# Patient Record
Sex: Male | Born: 1945 | Race: White | Hispanic: No | Marital: Married | Smoking: Never smoker
Health system: Southern US, Community
[De-identification: ages and names within clinical notes are randomized; demographics above are authoritative.]

## PROBLEM LIST (undated history)

## (undated) DIAGNOSIS — E785 Hyperlipidemia, unspecified: Secondary | ICD-10-CM

## (undated) DIAGNOSIS — M1990 Unspecified osteoarthritis, unspecified site: Secondary | ICD-10-CM

## (undated) DIAGNOSIS — G214 Vascular parkinsonism: Secondary | ICD-10-CM

## (undated) DIAGNOSIS — Z951 Presence of aortocoronary bypass graft: Secondary | ICD-10-CM

## (undated) DIAGNOSIS — E119 Type 2 diabetes mellitus without complications: Secondary | ICD-10-CM

## (undated) DIAGNOSIS — K219 Gastro-esophageal reflux disease without esophagitis: Secondary | ICD-10-CM

## (undated) DIAGNOSIS — Z8249 Family history of ischemic heart disease and other diseases of the circulatory system: Secondary | ICD-10-CM

## (undated) DIAGNOSIS — N183 Chronic kidney disease, stage 3 unspecified: Secondary | ICD-10-CM

## (undated) DIAGNOSIS — I10 Essential (primary) hypertension: Secondary | ICD-10-CM

## (undated) DIAGNOSIS — Z9289 Personal history of other medical treatment: Secondary | ICD-10-CM

## (undated) DIAGNOSIS — I4510 Unspecified right bundle-branch block: Secondary | ICD-10-CM

## (undated) DIAGNOSIS — I2510 Atherosclerotic heart disease of native coronary artery without angina pectoris: Secondary | ICD-10-CM

## (undated) DIAGNOSIS — Z8673 Personal history of transient ischemic attack (TIA), and cerebral infarction without residual deficits: Secondary | ICD-10-CM

## (undated) DIAGNOSIS — G479 Sleep disorder, unspecified: Secondary | ICD-10-CM

## (undated) HISTORY — DX: Sleep disorder, unspecified: G47.9

## (undated) HISTORY — DX: Personal history of transient ischemic attack (TIA), and cerebral infarction without residual deficits: Z86.73

## (undated) HISTORY — DX: Vascular parkinsonism: G21.4

## (undated) HISTORY — DX: Family history of ischemic heart disease and other diseases of the circulatory system: Z82.49

## (undated) HISTORY — DX: Hyperlipidemia, unspecified: E78.5

## (undated) HISTORY — PX: TONSILLECTOMY: SUR1361

## (undated) HISTORY — PX: CORONARY ARTERY BYPASS GRAFT: SHX141

## (undated) HISTORY — DX: Unspecified right bundle-branch block: I45.10

## (undated) HISTORY — DX: Atherosclerotic heart disease of native coronary artery without angina pectoris: I25.10

## (undated) HISTORY — DX: Chronic kidney disease, stage 3 unspecified: N18.30

## (undated) HISTORY — DX: Presence of aortocoronary bypass graft: Z95.1

## (undated) HISTORY — DX: Gastro-esophageal reflux disease without esophagitis: K21.9

## (undated) HISTORY — DX: Unspecified osteoarthritis, unspecified site: M19.90

## (undated) HISTORY — DX: Personal history of other medical treatment: Z92.89

## (undated) HISTORY — DX: Essential (primary) hypertension: I10

---

## 1999-05-12 ENCOUNTER — Encounter: Payer: Self-pay | Admitting: Cardiovascular Disease

## 1999-05-12 ENCOUNTER — Ambulatory Visit (HOSPITAL_COMMUNITY): Admission: RE | Admit: 1999-05-12 | Discharge: 1999-05-12 | Payer: Self-pay | Admitting: Cardiovascular Disease

## 1999-05-16 ENCOUNTER — Encounter: Payer: Self-pay | Admitting: Cardiothoracic Surgery

## 1999-05-16 ENCOUNTER — Inpatient Hospital Stay (HOSPITAL_COMMUNITY): Admission: RE | Admit: 1999-05-16 | Discharge: 1999-05-24 | Payer: Self-pay | Admitting: Cardiothoracic Surgery

## 1999-05-17 ENCOUNTER — Encounter: Payer: Self-pay | Admitting: Cardiothoracic Surgery

## 1999-05-18 ENCOUNTER — Encounter: Payer: Self-pay | Admitting: Cardiothoracic Surgery

## 1999-05-19 ENCOUNTER — Encounter: Payer: Self-pay | Admitting: Cardiothoracic Surgery

## 1999-05-20 ENCOUNTER — Encounter: Payer: Self-pay | Admitting: Cardiothoracic Surgery

## 1999-05-21 ENCOUNTER — Encounter: Payer: Self-pay | Admitting: Cardiothoracic Surgery

## 1999-07-22 ENCOUNTER — Encounter: Admission: RE | Admit: 1999-07-22 | Discharge: 1999-07-22 | Payer: Self-pay | Admitting: Cardiothoracic Surgery

## 1999-07-22 ENCOUNTER — Encounter: Payer: Self-pay | Admitting: Cardiothoracic Surgery

## 2000-04-03 HISTORY — PX: CHOLECYSTECTOMY: SHX55

## 2000-11-13 ENCOUNTER — Other Ambulatory Visit: Admission: RE | Admit: 2000-11-13 | Discharge: 2000-11-13 | Payer: Self-pay | Admitting: Gastroenterology

## 2011-04-24 DIAGNOSIS — Z23 Encounter for immunization: Secondary | ICD-10-CM | POA: Diagnosis not present

## 2011-04-24 DIAGNOSIS — E119 Type 2 diabetes mellitus without complications: Secondary | ICD-10-CM | POA: Diagnosis not present

## 2011-04-24 DIAGNOSIS — E782 Mixed hyperlipidemia: Secondary | ICD-10-CM | POA: Diagnosis not present

## 2011-04-24 DIAGNOSIS — Z79899 Other long term (current) drug therapy: Secondary | ICD-10-CM | POA: Diagnosis not present

## 2011-04-24 DIAGNOSIS — M109 Gout, unspecified: Secondary | ICD-10-CM | POA: Diagnosis not present

## 2011-04-26 DIAGNOSIS — I739 Peripheral vascular disease, unspecified: Secondary | ICD-10-CM | POA: Diagnosis not present

## 2011-04-28 DIAGNOSIS — Z1211 Encounter for screening for malignant neoplasm of colon: Secondary | ICD-10-CM | POA: Diagnosis not present

## 2011-07-31 DIAGNOSIS — E782 Mixed hyperlipidemia: Secondary | ICD-10-CM | POA: Diagnosis not present

## 2011-09-05 DIAGNOSIS — L821 Other seborrheic keratosis: Secondary | ICD-10-CM | POA: Diagnosis not present

## 2011-09-05 DIAGNOSIS — D239 Other benign neoplasm of skin, unspecified: Secondary | ICD-10-CM | POA: Diagnosis not present

## 2011-12-12 DIAGNOSIS — H2510 Age-related nuclear cataract, unspecified eye: Secondary | ICD-10-CM | POA: Diagnosis not present

## 2011-12-12 DIAGNOSIS — H25049 Posterior subcapsular polar age-related cataract, unspecified eye: Secondary | ICD-10-CM | POA: Diagnosis not present

## 2011-12-12 DIAGNOSIS — H5210 Myopia, unspecified eye: Secondary | ICD-10-CM | POA: Diagnosis not present

## 2011-12-12 DIAGNOSIS — H02839 Dermatochalasis of unspecified eye, unspecified eyelid: Secondary | ICD-10-CM | POA: Diagnosis not present

## 2012-01-02 DIAGNOSIS — Z9289 Personal history of other medical treatment: Secondary | ICD-10-CM

## 2012-01-02 HISTORY — DX: Personal history of other medical treatment: Z92.89

## 2012-02-01 DIAGNOSIS — I10 Essential (primary) hypertension: Secondary | ICD-10-CM | POA: Diagnosis not present

## 2012-02-01 DIAGNOSIS — E782 Mixed hyperlipidemia: Secondary | ICD-10-CM | POA: Diagnosis not present

## 2012-02-01 DIAGNOSIS — I2510 Atherosclerotic heart disease of native coronary artery without angina pectoris: Secondary | ICD-10-CM | POA: Diagnosis not present

## 2012-02-01 DIAGNOSIS — I739 Peripheral vascular disease, unspecified: Secondary | ICD-10-CM | POA: Diagnosis not present

## 2012-02-12 DIAGNOSIS — E782 Mixed hyperlipidemia: Secondary | ICD-10-CM | POA: Diagnosis not present

## 2012-02-12 DIAGNOSIS — I10 Essential (primary) hypertension: Secondary | ICD-10-CM | POA: Diagnosis not present

## 2012-02-12 DIAGNOSIS — R9431 Abnormal electrocardiogram [ECG] [EKG]: Secondary | ICD-10-CM | POA: Diagnosis not present

## 2012-02-12 DIAGNOSIS — I2510 Atherosclerotic heart disease of native coronary artery without angina pectoris: Secondary | ICD-10-CM | POA: Diagnosis not present

## 2012-02-27 DIAGNOSIS — Z23 Encounter for immunization: Secondary | ICD-10-CM | POA: Diagnosis not present

## 2012-04-24 DIAGNOSIS — E782 Mixed hyperlipidemia: Secondary | ICD-10-CM | POA: Diagnosis not present

## 2012-04-24 DIAGNOSIS — R7309 Other abnormal glucose: Secondary | ICD-10-CM | POA: Diagnosis not present

## 2012-04-24 DIAGNOSIS — Z79899 Other long term (current) drug therapy: Secondary | ICD-10-CM | POA: Diagnosis not present

## 2012-04-24 DIAGNOSIS — I2510 Atherosclerotic heart disease of native coronary artery without angina pectoris: Secondary | ICD-10-CM | POA: Diagnosis not present

## 2012-04-29 DIAGNOSIS — Z1211 Encounter for screening for malignant neoplasm of colon: Secondary | ICD-10-CM | POA: Diagnosis not present

## 2012-05-07 DIAGNOSIS — E119 Type 2 diabetes mellitus without complications: Secondary | ICD-10-CM | POA: Diagnosis not present

## 2012-08-23 DIAGNOSIS — J029 Acute pharyngitis, unspecified: Secondary | ICD-10-CM | POA: Diagnosis not present

## 2012-08-23 DIAGNOSIS — B9789 Other viral agents as the cause of diseases classified elsewhere: Secondary | ICD-10-CM | POA: Diagnosis not present

## 2012-09-20 ENCOUNTER — Other Ambulatory Visit: Payer: Self-pay

## 2012-09-20 DIAGNOSIS — M545 Low back pain: Secondary | ICD-10-CM | POA: Diagnosis not present

## 2012-09-20 MED ORDER — PANTOPRAZOLE SODIUM 40 MG PO TBEC: 40.0000 mg | DELAYED_RELEASE_TABLET | Freq: Every day | ORAL | Status: DC

## 2012-09-20 MED ORDER — METOPROLOL SUCCINATE ER 50 MG PO TB24: 50.0000 mg | ORAL_TABLET | Freq: Every day | ORAL | Status: DC

## 2012-09-20 NOTE — Telephone Encounter (Signed)
Rx was sent to pharmacy electronically. 

## 2012-11-21 ENCOUNTER — Other Ambulatory Visit: Payer: Self-pay | Admitting: *Deleted

## 2012-11-21 MED ORDER — RAMIPRIL 5 MG PO CAPS: 5.0000 mg | ORAL_CAPSULE | Freq: Every day | ORAL | Status: DC

## 2012-11-21 NOTE — Telephone Encounter (Signed)
Rx was sent to pharmacy electronically. 

## 2012-12-11 ENCOUNTER — Other Ambulatory Visit: Payer: Self-pay | Admitting: Dermatology

## 2012-12-11 DIAGNOSIS — D2330 Other benign neoplasm of skin of unspecified part of face: Secondary | ICD-10-CM | POA: Diagnosis not present

## 2012-12-11 DIAGNOSIS — L570 Actinic keratosis: Secondary | ICD-10-CM | POA: Diagnosis not present

## 2012-12-11 DIAGNOSIS — D219 Benign neoplasm of connective and other soft tissue, unspecified: Secondary | ICD-10-CM | POA: Diagnosis not present

## 2012-12-11 DIAGNOSIS — D1801 Hemangioma of skin and subcutaneous tissue: Secondary | ICD-10-CM | POA: Diagnosis not present

## 2012-12-11 DIAGNOSIS — C44319 Basal cell carcinoma of skin of other parts of face: Secondary | ICD-10-CM | POA: Diagnosis not present

## 2012-12-11 DIAGNOSIS — L821 Other seborrheic keratosis: Secondary | ICD-10-CM | POA: Diagnosis not present

## 2012-12-11 DIAGNOSIS — D239 Other benign neoplasm of skin, unspecified: Secondary | ICD-10-CM | POA: Diagnosis not present

## 2013-01-14 DIAGNOSIS — H04129 Dry eye syndrome of unspecified lacrimal gland: Secondary | ICD-10-CM | POA: Diagnosis not present

## 2013-01-14 DIAGNOSIS — R7309 Other abnormal glucose: Secondary | ICD-10-CM | POA: Diagnosis not present

## 2013-01-14 DIAGNOSIS — H2510 Age-related nuclear cataract, unspecified eye: Secondary | ICD-10-CM | POA: Diagnosis not present

## 2013-01-14 DIAGNOSIS — H25049 Posterior subcapsular polar age-related cataract, unspecified eye: Secondary | ICD-10-CM | POA: Diagnosis not present

## 2013-02-01 HISTORY — PX: NOSE SURGERY: SHX723

## 2013-02-05 DIAGNOSIS — C44319 Basal cell carcinoma of skin of other parts of face: Secondary | ICD-10-CM | POA: Diagnosis not present

## 2013-02-05 DIAGNOSIS — Z85828 Personal history of other malignant neoplasm of skin: Secondary | ICD-10-CM | POA: Diagnosis not present

## 2013-02-13 ENCOUNTER — Encounter: Payer: Self-pay | Admitting: Cardiovascular Disease

## 2013-02-13 ENCOUNTER — Ambulatory Visit (INDEPENDENT_AMBULATORY_CARE_PROVIDER_SITE_OTHER): Payer: Medicare Other | Admitting: Cardiovascular Disease

## 2013-02-13 VITALS — BP 150/86 | HR 57 | Ht 72.0 in | Wt 204.7 lb

## 2013-02-13 DIAGNOSIS — Z951 Presence of aortocoronary bypass graft: Secondary | ICD-10-CM | POA: Insufficient documentation

## 2013-02-13 DIAGNOSIS — I2510 Atherosclerotic heart disease of native coronary artery without angina pectoris: Secondary | ICD-10-CM | POA: Diagnosis not present

## 2013-02-13 DIAGNOSIS — I4510 Unspecified right bundle-branch block: Secondary | ICD-10-CM | POA: Insufficient documentation

## 2013-02-13 DIAGNOSIS — I10 Essential (primary) hypertension: Secondary | ICD-10-CM | POA: Diagnosis not present

## 2013-02-13 DIAGNOSIS — E785 Hyperlipidemia, unspecified: Secondary | ICD-10-CM | POA: Diagnosis not present

## 2013-02-13 NOTE — Assessment & Plan Note (Signed)
On statin therapy followed by his PCP 

## 2013-02-13 NOTE — Progress Notes (Signed)
02/13/2013 Joel Gonzales   1945/11/14  [id number]  Primary Physician No PCP Per Patient Primary Cardiologist: Runell Gess MD Roseanne Reno   HPI: The patient is a very pleasant 67 year old mildly overweight Caucasian male, father of 1 child, who I last saw a year ago. He has a history of CAD status post coronary artery bypass surgery x6 in February 2001. He had a LIMA to his LAD, a RIMA to his PDA, right radial to the first obtuse marginal branch, diagonal branch, second marginal branch, and posterolateral branches. His other problems include treated hypertension, dyslipidemia, and chronic right bundle branch block. He denies chest pain or shortness of breath. Dr. Lorin Picket has checked his lipid profile in the past. He had a Myoview stress test performed October 31, which was entirely normal. Since I saw him year ago he's been completely asymptomatic. His primary care physician follows his lipid profile.   Current Outpatient Prescriptions  Medication Sig Dispense Refill  . aspirin 81 MG tablet Take 81 mg by mouth daily.      Marland Kitchen atorvastatin (LIPITOR) 40 MG tablet Take 40 mg by mouth daily.      . metoprolol succinate (TOPROL-XL) 50 MG 24 hr tablet Take 1 tablet (50 mg total) by mouth daily. Take with or immediately following a meal.  30 tablet  5  . pantoprazole (PROTONIX) 40 MG tablet Take 1 tablet (40 mg total) by mouth daily.  30 tablet  5  . ramipril (ALTACE) 5 MG capsule Take 1 capsule (5 mg total) by mouth daily.  30 capsule  3   No current facility-administered medications for this visit.    Not on File  History   Social History  . Marital Status: Married    Spouse Name: N/A    Number of Children: N/A  . Years of Education: N/A   Occupational History  . Not on file.   Social History Main Topics  . Smoking status: Never Smoker   . Smokeless tobacco: Not on file  . Alcohol Use: Yes  . Drug Use: No  . Sexual Activity: Not on file   Other Topics Concern    . Not on file   Social History Narrative  . No narrative on file     Review of Systems: General: negative for chills, fever, night sweats or weight changes.  Cardiovascular: negative for chest pain, dyspnea on exertion, edema, orthopnea, palpitations, paroxysmal nocturnal dyspnea or shortness of breath Dermatological: negative for rash Respiratory: negative for cough or wheezing Urologic: negative for hematuria Abdominal: negative for nausea, vomiting, diarrhea, bright red blood per rectum, melena, or hematemesis Neurologic: negative for visual changes, syncope, or dizziness All other systems reviewed and are otherwise negative except as noted above.    Blood pressure 150/86, pulse 57, height 6' (1.829 m), weight 204 lb 11.2 oz (92.851 kg).  General appearance: alert and no distress Neck: no adenopathy, no carotid bruit, no JVD, supple, symmetrical, trachea midline and thyroid not enlarged, symmetric, no tenderness/mass/nodules Lungs: clear to auscultation bilaterally Heart: regular rate and rhythm, S1, S2 normal, no murmur, click, rub or gallop Extremities: extremities normal, atraumatic, no cyanosis or edema  EKG sinus bradycardia at 57 with right lower branch block  ASSESSMENT AND PLAN:   Coronary artery disease sstatus post coronary artery bypass grafting times 05/10/1999 with a LIMA to his LAD, RIMA to his PDA and a right radial to the first obtuse marginal branch, contains a diagonal branch, circumflex marginal branch and  posterolateral branches. His last Myoview stress test performed 02/01/12 was nonischemic. He is totally asymptomatic.  Hyperlipidemia LDL goal < 130 On statin therapy followed by his PCP  Essential hypertension Well-controlled on current medications  Right bundle branch block Chronic. I have provided him with a copy of his EKG for his records      Runell Gess MD [facility], [facility] 02/13/2013 11:06 AM

## 2013-02-13 NOTE — Patient Instructions (Signed)
Your physician wants you to follow-up in: 1 year with Dr Berry. You will receive a reminder letter in the mail two months in advance. If you don't receive a letter, please call our office to schedule the follow-up appointment.  

## 2013-02-13 NOTE — Assessment & Plan Note (Signed)
Well-controlled on current medications 

## 2013-02-13 NOTE — Assessment & Plan Note (Signed)
sstatus post coronary artery bypass grafting times 05/10/1999 with a LIMA to his LAD, RIMA to his PDA and a right radial to the first obtuse marginal branch, contains a diagonal branch, circumflex marginal branch and posterolateral branches. His last Myoview stress test performed 02/01/12 was nonischemic. He is totally asymptomatic.

## 2013-02-13 NOTE — Assessment & Plan Note (Signed)
Chronic. I have provided him with a copy of his EKG for his records

## 2013-03-07 DIAGNOSIS — Z23 Encounter for immunization: Secondary | ICD-10-CM | POA: Diagnosis not present

## 2013-03-21 ENCOUNTER — Other Ambulatory Visit: Payer: Self-pay | Admitting: *Deleted

## 2013-03-21 MED ORDER — RAMIPRIL 5 MG PO CAPS: 5.0000 mg | ORAL_CAPSULE | Freq: Every day | ORAL | Status: DC

## 2013-03-21 NOTE — Telephone Encounter (Signed)
Rx was sent to pharmacy electronically. 

## 2013-06-11 DIAGNOSIS — L570 Actinic keratosis: Secondary | ICD-10-CM | POA: Diagnosis not present

## 2013-06-11 DIAGNOSIS — Z85828 Personal history of other malignant neoplasm of skin: Secondary | ICD-10-CM | POA: Diagnosis not present

## 2013-06-11 DIAGNOSIS — L821 Other seborrheic keratosis: Secondary | ICD-10-CM | POA: Diagnosis not present

## 2013-06-11 DIAGNOSIS — D239 Other benign neoplasm of skin, unspecified: Secondary | ICD-10-CM | POA: Diagnosis not present

## 2013-12-22 DIAGNOSIS — M949 Disorder of cartilage, unspecified: Secondary | ICD-10-CM | POA: Diagnosis not present

## 2013-12-22 DIAGNOSIS — M542 Cervicalgia: Secondary | ICD-10-CM | POA: Diagnosis not present

## 2013-12-22 DIAGNOSIS — E119 Type 2 diabetes mellitus without complications: Secondary | ICD-10-CM | POA: Diagnosis not present

## 2013-12-22 DIAGNOSIS — M899 Disorder of bone, unspecified: Secondary | ICD-10-CM | POA: Diagnosis not present

## 2013-12-22 DIAGNOSIS — E782 Mixed hyperlipidemia: Secondary | ICD-10-CM | POA: Diagnosis not present

## 2014-01-08 ENCOUNTER — Other Ambulatory Visit: Payer: Self-pay | Admitting: Cardiovascular Disease

## 2014-01-08 DIAGNOSIS — I10 Essential (primary) hypertension: Secondary | ICD-10-CM | POA: Diagnosis not present

## 2014-01-08 DIAGNOSIS — Z23 Encounter for immunization: Secondary | ICD-10-CM | POA: Diagnosis not present

## 2014-01-08 DIAGNOSIS — Z0000 Encounter for general adult medical examination without abnormal findings: Secondary | ICD-10-CM | POA: Diagnosis not present

## 2014-01-08 DIAGNOSIS — E1165 Type 2 diabetes mellitus with hyperglycemia: Secondary | ICD-10-CM | POA: Diagnosis not present

## 2014-01-08 DIAGNOSIS — I2510 Atherosclerotic heart disease of native coronary artery without angina pectoris: Secondary | ICD-10-CM | POA: Diagnosis not present

## 2014-01-08 DIAGNOSIS — E782 Mixed hyperlipidemia: Secondary | ICD-10-CM | POA: Diagnosis not present

## 2014-01-08 NOTE — Telephone Encounter (Signed)
Rx was sent to pharmacy electronically. OV 11/17

## 2014-01-09 DIAGNOSIS — Z1211 Encounter for screening for malignant neoplasm of colon: Secondary | ICD-10-CM | POA: Diagnosis not present

## 2014-02-17 ENCOUNTER — Ambulatory Visit (INDEPENDENT_AMBULATORY_CARE_PROVIDER_SITE_OTHER): Payer: Medicare Other | Admitting: Cardiovascular Disease

## 2014-02-17 ENCOUNTER — Encounter: Payer: Self-pay | Admitting: Cardiovascular Disease

## 2014-02-17 VITALS — BP 128/62 | HR 62 | Ht 71.0 in | Wt 191.5 lb

## 2014-02-17 DIAGNOSIS — I10 Essential (primary) hypertension: Secondary | ICD-10-CM | POA: Diagnosis not present

## 2014-02-17 DIAGNOSIS — I2510 Atherosclerotic heart disease of native coronary artery without angina pectoris: Secondary | ICD-10-CM | POA: Diagnosis not present

## 2014-02-17 DIAGNOSIS — E785 Hyperlipidemia, unspecified: Secondary | ICD-10-CM | POA: Diagnosis not present

## 2014-02-17 DIAGNOSIS — I4510 Unspecified right bundle-branch block: Secondary | ICD-10-CM

## 2014-02-17 NOTE — Assessment & Plan Note (Signed)
Chronic right upper lobe branch block present on today's EKG as well

## 2014-02-17 NOTE — Assessment & Plan Note (Signed)
History of CAD status post coronary artery bypass grafting February 2001 with a LIMA to his LAD, RIMA to the PDA and right radial to obtuse marginal branch, diagonal branch and second marginal branch as well as posterior lateral branches. His last Myoview performed October 2013 was nonischemic. He denies chest pain or shortness of breath.

## 2014-02-17 NOTE — Assessment & Plan Note (Signed)
History of hypertension with blood pressure measured in the office today 128/62. He is on enalapril and metoprolol. Continue current medications at current doses

## 2014-02-17 NOTE — Patient Instructions (Signed)
Your physician recommends that you schedule a follow-up appointment in:ONE year with Dr.Berry

## 2014-02-17 NOTE — Assessment & Plan Note (Signed)
History of hyperlipidemia on atorvastatin 40 mg daily. Recent lipid profile performed by his primary care physician on 12/22/13 revealed a total cholesterol 149, LDL 87 and HDL of 35.

## 2014-02-17 NOTE — Progress Notes (Signed)
02/17/2014 Joel Gonzales   Aug 21, 1945  [id number]  Primary Physician Ann Held, MD Primary Cardiologist: Lorretta Harp MD Joel Gonzales   HPI:  The patient is a very pleasant 68 year old mildly overweight Caucasian male, father of 1 child, who I last saw a year ago. He has a history of CAD status post coronary artery bypass surgery x6 in February 2001. He had a LIMA to his LAD, a RIMA to his PDA, right radial to the first obtuse marginal branch, diagonal branch, second marginal branch, and posterolateral branches. His other problems include treated hypertension, dyslipidemia, and chronic right bundle branch block. He denies chest pain or shortness of breath. Dr. Nicki Reaper has checked his lipid profile in the past. He had a Myoview stress test performed October 31, which was entirely normal. Since I saw him year ago he's been completely asymptomatic. His primary care physician follows his lipid profile which most recently checked 12/22/13 revealed total cholesterol 149, LDL of 87 and HDL of 35.   Current Outpatient Prescriptions  Medication Sig Dispense Refill  . aspirin 81 MG tablet Take 81 mg by mouth daily.    Marland Kitchen atorvastatin (LIPITOR) 40 MG tablet Take 40 mg by mouth daily.    . metoprolol succinate (TOPROL-XL) 50 MG 24 hr tablet Take 1 tablet (50 mg total) by mouth daily. Take with or immediately following a meal. 30 tablet 5  . pantoprazole (PROTONIX) 40 MG tablet Take 1 tablet (40 mg total) by mouth daily. 30 tablet 5  . ramipril (ALTACE) 5 MG capsule TAKE 1 CAPSULE BY MOUTH DAILY. 30 capsule 1  . sitaGLIPtin-metformin (JANUMET) 50-1000 MG per tablet Take 1 tablet by mouth daily before breakfast.     No current facility-administered medications for this visit.    No Known Allergies  History   Social History  . Marital Status: Married    Spouse Name: N/A    Number of Children: N/A  . Years of Education: N/A   Occupational History  . Not on file.   Social  History Main Topics  . Smoking status: Never Smoker   . Smokeless tobacco: Not on file  . Alcohol Use: Yes  . Drug Use: No  . Sexual Activity: Not on file   Other Topics Concern  . Not on file   Social History Narrative     Review of Systems: General: negative for chills, fever, night sweats or weight changes.  Cardiovascular: negative for chest pain, dyspnea on exertion, edema, orthopnea, palpitations, paroxysmal nocturnal dyspnea or shortness of breath Dermatological: negative for rash Respiratory: negative for cough or wheezing Urologic: negative for hematuria Abdominal: negative for nausea, vomiting, diarrhea, bright red blood per rectum, melena, or hematemesis Neurologic: negative for visual changes, syncope, or dizziness All other systems reviewed and are otherwise negative except as noted above.    Blood pressure 128/62, pulse 62, height 5\' 11"  (1.803 m), weight 191 lb 8 oz (86.864 kg).  General appearance: alert and no distress Neck: no adenopathy, no carotid bruit, no JVD, supple, symmetrical, trachea midline and thyroid not enlarged, symmetric, no tenderness/mass/nodules Lungs: clear to auscultation bilaterally Heart: regular rate and rhythm, S1, S2 normal, no murmur, click, rub or gallop Extremities: extremities normal, atraumatic, no cyanosis or edema  EKG Normal sinus rhythm at 62 with right bundle branch block unchanged from prior EKGs. I personally reviewed this EKG  ASSESSMENT AND PLAN:   Coronary artery disease History of CAD status post coronary artery bypass grafting February 2001  with a LIMA to his LAD, RIMA to the PDA and right radial to obtuse marginal branch, diagonal branch and second marginal branch as well as posterior lateral branches. His last Myoview performed October 2013 was nonischemic. He denies chest pain or shortness of breath.  Essential hypertension History of hypertension with blood pressure measured in the office today 128/62. He is on  enalapril and metoprolol. Continue current medications at current doses  Hyperlipidemia with target LDL less than 130 History of hyperlipidemia on atorvastatin 40 mg daily. Recent lipid profile performed by his primary care physician on 12/22/13 revealed a total cholesterol 149, LDL 87 and HDL of 35.  Right bundle branch block Chronic right upper lobe branch block present on today's EKG as well      Lorretta Harp MD [facility], [facility] 02/17/2014 2:16 PM

## 2014-02-23 DIAGNOSIS — H2513 Age-related nuclear cataract, bilateral: Secondary | ICD-10-CM | POA: Diagnosis not present

## 2014-02-23 DIAGNOSIS — E119 Type 2 diabetes mellitus without complications: Secondary | ICD-10-CM | POA: Diagnosis not present

## 2014-02-23 DIAGNOSIS — H5213 Myopia, bilateral: Secondary | ICD-10-CM | POA: Diagnosis not present

## 2014-02-23 DIAGNOSIS — H25041 Posterior subcapsular polar age-related cataract, right eye: Secondary | ICD-10-CM | POA: Diagnosis not present

## 2014-03-13 ENCOUNTER — Other Ambulatory Visit: Payer: Self-pay | Admitting: Cardiovascular Disease

## 2014-03-13 NOTE — Telephone Encounter (Signed)
Rx has been sent to the pharmacy electronically. ° °

## 2014-04-22 ENCOUNTER — Telehealth: Payer: Self-pay | Admitting: Cardiovascular Disease

## 2014-04-22 DIAGNOSIS — I2510 Atherosclerotic heart disease of native coronary artery without angina pectoris: Secondary | ICD-10-CM

## 2014-04-22 NOTE — Telephone Encounter (Signed)
Message routed to Dr. Cecil Cobbs, RN to review and advise on DOT note.

## 2014-04-22 NOTE — Telephone Encounter (Signed)
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

## 2014-04-23 DIAGNOSIS — E1165 Type 2 diabetes mellitus with hyperglycemia: Secondary | ICD-10-CM | POA: Diagnosis not present

## 2014-04-23 NOTE — Telephone Encounter (Signed)
I called and spoke with Brittainy to let her know that Dr Gwenlyn Found would not be able to address this until next week.  She verbalized understanding.

## 2014-04-29 DIAGNOSIS — E1165 Type 2 diabetes mellitus with hyperglycemia: Secondary | ICD-10-CM | POA: Diagnosis not present

## 2014-04-29 DIAGNOSIS — K429 Umbilical hernia without obstruction or gangrene: Secondary | ICD-10-CM | POA: Diagnosis not present

## 2014-05-01 NOTE — Telephone Encounter (Signed)
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

## 2014-05-04 NOTE — Telephone Encounter (Signed)
I spoke with patient and gave him Dr Kennon Holter recommendations for a myoview and echo.  He is agreeable to proceed. Order placed.

## 2014-05-05 ENCOUNTER — Encounter: Payer: Self-pay | Admitting: *Deleted

## 2014-05-05 ENCOUNTER — Telehealth: Payer: Self-pay | Admitting: Cardiovascular Disease

## 2014-05-05 DIAGNOSIS — I10 Essential (primary) hypertension: Secondary | ICD-10-CM | POA: Diagnosis not present

## 2014-05-05 DIAGNOSIS — K429 Umbilical hernia without obstruction or gangrene: Secondary | ICD-10-CM | POA: Diagnosis not present

## 2014-05-05 DIAGNOSIS — E119 Type 2 diabetes mellitus without complications: Secondary | ICD-10-CM | POA: Diagnosis not present

## 2014-05-05 NOTE — Telephone Encounter (Signed)
Request for surgical clearance:  1. What type of surgery is being performed? Umbilical Hernia Repair  2. When is this surgery scheduled? Has not been scheduled yet  3. Are there any medications that need to be held prior to surgery and how long?has not been specified  4. Name of physician performing surgery? Dr. Loetta Rough  5. What is your office phone and fax number? [phone].

## 2014-05-05 NOTE — Telephone Encounter (Signed)
Can this encounter be closed?

## 2014-05-05 NOTE — Telephone Encounter (Signed)
Cleared at low risk for his umbilical hernia repair procedure

## 2014-05-05 NOTE — Telephone Encounter (Signed)
Pt needing surgical clearance and/or medication instructions prior to umbilical hernia repair.  Seen 11/15 for ROV rec 1 yr f/u.  S/p CABG 2001  Myoview stress test Oct 13 normal  Currently on ASA 81mg  daily.  No daily warfarin or NOAC use.

## 2014-05-06 NOTE — Telephone Encounter (Signed)
Can this encounter be closed?

## 2014-05-08 ENCOUNTER — Telehealth: Payer: Self-pay | Admitting: *Deleted

## 2014-05-08 NOTE — Telephone Encounter (Signed)
Letter sent via epic to Dr. Loetta Rough. Last office note sent via epic. Patient scheduled for upcoming 2D echo and Myocardial Perfusion test. Will send results when received.

## 2014-05-08 NOTE — Telephone Encounter (Signed)
Error

## 2014-05-13 ENCOUNTER — Telehealth (HOSPITAL_COMMUNITY): Payer: Self-pay

## 2014-05-13 NOTE — Telephone Encounter (Signed)
Encounter complete. 

## 2014-05-15 ENCOUNTER — Ambulatory Visit (HOSPITAL_COMMUNITY)
Admission: RE | Admit: 2014-05-15 | Discharge: 2014-05-15 | Disposition: A | Discharge status: Home / Self Care | Payer: Medicare Other | Source: Ambulatory Visit | Attending: Cardiovascular Disease | Admitting: Cardiovascular Disease

## 2014-05-15 ENCOUNTER — Ambulatory Visit (HOSPITAL_BASED_OUTPATIENT_CLINIC_OR_DEPARTMENT_OTHER)
Admission: RE | Admit: 2014-05-15 | Discharge: 2014-05-15 | Disposition: A | Discharge status: Home / Self Care | Payer: Medicare Other | Source: Ambulatory Visit | Attending: Cardiovascular Disease | Admitting: Cardiovascular Disease

## 2014-05-15 DIAGNOSIS — Z951 Presence of aortocoronary bypass graft: Secondary | ICD-10-CM | POA: Diagnosis not present

## 2014-05-15 DIAGNOSIS — I2510 Atherosclerotic heart disease of native coronary artery without angina pectoris: Secondary | ICD-10-CM

## 2014-05-15 DIAGNOSIS — Z8249 Family history of ischemic heart disease and other diseases of the circulatory system: Secondary | ICD-10-CM | POA: Diagnosis not present

## 2014-05-15 DIAGNOSIS — Z833 Family history of diabetes mellitus: Secondary | ICD-10-CM | POA: Diagnosis not present

## 2014-05-15 MED ORDER — REGADENOSON 0.4 MG/5ML IV SOLN
0.4000 mg | Freq: Once | INTRAVENOUS | Status: AC
  Administered 2014-05-15: 0.4 mg via INTRAVENOUS

## 2014-05-15 MED ORDER — TECHNETIUM TC 99M SESTAMIBI GENERIC - CARDIOLITE
31.0000 | Freq: Once | INTRAVENOUS | Status: AC | PRN
  Administered 2014-05-15: 31 via INTRAVENOUS

## 2014-05-15 MED ORDER — TECHNETIUM TC 99M SESTAMIBI GENERIC - CARDIOLITE
10.6000 | Freq: Once | INTRAVENOUS | Status: AC | PRN
  Administered 2014-05-15: 11 via INTRAVENOUS

## 2014-05-15 MED ORDER — AMINOPHYLLINE 25 MG/ML IV SOLN
125.0000 mg | Freq: Once | INTRAVENOUS | Status: AC
  Administered 2014-05-15: 125 mg via INTRAVENOUS

## 2014-05-15 NOTE — Procedures (Addendum)
[place] [address] [place] [address] [phone]  Cardiology Nuclear Med Study  Joel Gonzales. is a 69 y.o. male     MRN : [id number]     DOB: 05-Oct-1945  Procedure Date: 05/15/2014  Nuclear Med Background Indication for Stress Test:  Follow up CAD History:  CAD;RBBB;CABG x6-05/1999;Last NUC MPI on 02/01/2012-normal(not in epic);diabetes Cardiac Risk Factors: Family History - CAD, Hypertension, Lipids, NIDDM, Overweight and RBBB  Symptoms:  Pt denies symptoms at this time.   Nuclear Pre-Procedure Caffeine/Decaff Intake:  7:00pm NPO After: 5:00am   IV Site: R Forearm  IV 0.9% NS with Angio Cath:  22g  Chest Size (in):  46" IV Started by: Rolene Course, RN  Height: 5\' 11"  (1.803 m)  Cup Size: n/a  BMI:  Body mass index is 26.65 kg/(m^2). Weight:  191 lb (86.637 kg)   Tech Comments:  n/a    Nuclear Med Study 1 or 2 day study: 1 day  Stress Test Type:  Trail  Order Authorizing Provider:  Quay Burow, MD   Resting Radionuclide: Technetium 72m Sestamibi  Resting Radionuclide Dose: 10.6 mCi   Stress Radionuclide:  Technetium 3m Sestamibi  Stress Radionuclide Dose: 31.0 mCi           Stress Protocol Rest HR: 55 Stress HR: 76  Rest BP: 140/81 Stress BP:155/98  Exercise Time (min): n/a METS: n/a          Dose of Adenosine (mg):  n/a Dose of Lexiscan: 0.4 mg  Dose of Atropine (mg): n/a Dose of Dobutamine: n/a mcg/kg/min (at max HR)  Stress Test Technologist: Mellody Memos, CCT Nuclear Technologist: Imagene Riches, CNMT   Rest Procedure:  Myocardial perfusion imaging was performed at rest 45 minutes following the intravenous administration of Technetium 31m Sestamibi. Stress Procedure:  The patient received IV Lexiscan 0.4 mg over 15-seconds.  Technetium 42m Sestamibi injected IV at 30-seconds. Patient experienced light-headedness, a drop in blood pressure and heart rate. He was administered 125 mg of  Aminophylline IV at 5 minutes.  There were no significant changes with Lexiscan.  Quantitative spect images were obtained after a 45 minute delay.  Transient Ischemic Dilatation (Normal <1.22):  1.26  QGS EDV:  116 ml QGS ESV:  54 ml LV Ejection Fraction: 53%        Rest ECG: NSR-RBBB  Stress ECG: No significant change from baseline ECG  QPS Raw Data Images:  Normal; no motion artifact; normal heart/lung ratio. Stress Images:  Normal homogeneous uptake in all areas of the myocardium. Rest Images:  Normal homogeneous uptake in all areas of the myocardium. Subtraction (SDS):  No evidence of ischemia.  Impression Exercise Capacity:  Lexiscan with no exercise. BP Response:  Hypotensive blood pressure response. Clinical Symptoms:  No significant symptoms noted. ECG Impression:  No significant ST segment change suggestive of ischemia. Comparison with Prior Nuclear Study: No significant change from previous study  Overall Impression:  Normal stress nuclear study.  LV Wall Motion:  NL LV Function; NL Wall Motion   Lorretta Harp, MD  05/15/2014 1:48 PM

## 2014-05-15 NOTE — Progress Notes (Signed)
2D Echocardiogram Complete.  05/15/2014   Senita Corredor, RDCS  

## 2014-05-19 NOTE — Telephone Encounter (Signed)
Have these results been received yet?

## 2014-05-27 NOTE — Telephone Encounter (Signed)
Has this pt received his results yet?

## 2014-06-17 DIAGNOSIS — D1801 Hemangioma of skin and subcutaneous tissue: Secondary | ICD-10-CM | POA: Diagnosis not present

## 2014-06-17 DIAGNOSIS — L821 Other seborrheic keratosis: Secondary | ICD-10-CM | POA: Diagnosis not present

## 2014-06-17 DIAGNOSIS — Z85828 Personal history of other malignant neoplasm of skin: Secondary | ICD-10-CM | POA: Diagnosis not present

## 2014-06-17 DIAGNOSIS — L570 Actinic keratosis: Secondary | ICD-10-CM | POA: Diagnosis not present

## 2014-06-17 DIAGNOSIS — D2262 Melanocytic nevi of left upper limb, including shoulder: Secondary | ICD-10-CM | POA: Diagnosis not present

## 2014-06-17 DIAGNOSIS — D225 Melanocytic nevi of trunk: Secondary | ICD-10-CM | POA: Diagnosis not present

## 2014-07-28 ENCOUNTER — Encounter: Payer: Self-pay | Admitting: *Deleted

## 2015-01-18 ENCOUNTER — Other Ambulatory Visit: Payer: Self-pay | Admitting: Cardiovascular Disease

## 2015-01-18 NOTE — Telephone Encounter (Signed)
Rx request sent to pharmacy.  

## 2015-02-16 ENCOUNTER — Other Ambulatory Visit: Payer: Self-pay | Admitting: Cardiovascular Disease

## 2015-02-23 ENCOUNTER — Ambulatory Visit (INDEPENDENT_AMBULATORY_CARE_PROVIDER_SITE_OTHER): Payer: Medicare Other | Admitting: Cardiovascular Disease

## 2015-02-23 ENCOUNTER — Encounter: Payer: Self-pay | Admitting: Cardiovascular Disease

## 2015-02-23 DIAGNOSIS — I4510 Unspecified right bundle-branch block: Secondary | ICD-10-CM

## 2015-02-23 DIAGNOSIS — I10 Essential (primary) hypertension: Secondary | ICD-10-CM | POA: Diagnosis not present

## 2015-02-23 DIAGNOSIS — I2510 Atherosclerotic heart disease of native coronary artery without angina pectoris: Secondary | ICD-10-CM

## 2015-02-23 DIAGNOSIS — E785 Hyperlipidemia, unspecified: Secondary | ICD-10-CM

## 2015-02-23 MED ORDER — METOPROLOL SUCCINATE ER 50 MG PO TB24: 50.0000 mg | ORAL_TABLET | Freq: Every day | ORAL | Status: DC

## 2015-02-23 MED ORDER — ATORVASTATIN CALCIUM 40 MG PO TABS: 40.0000 mg | ORAL_TABLET | Freq: Every day | ORAL | Status: DC

## 2015-02-23 MED ORDER — PANTOPRAZOLE SODIUM 40 MG PO TBEC
40.0000 mg | DELAYED_RELEASE_TABLET | Freq: Every day | ORAL | Status: DC
Start: 2015-02-23 — End: 2022-03-14

## 2015-02-23 MED ORDER — RAMIPRIL 5 MG PO CAPS: 5.0000 mg | ORAL_CAPSULE | Freq: Every day | ORAL | Status: DC

## 2015-02-23 NOTE — Assessment & Plan Note (Signed)
History of hyperlipidemia atorvastatin 40 followed by his PCP

## 2015-02-23 NOTE — Assessment & Plan Note (Signed)
chronic

## 2015-02-23 NOTE — Assessment & Plan Note (Signed)
History of hypertension with blood pressure measured today 140/80. He is on metoprolol and ramipril. Continue current meds at current dosing

## 2015-02-23 NOTE — Progress Notes (Signed)
02/23/2015 Joel Gonzales.   17-Oct-1945  [id number]  Primary Physician Ann Held, MD Primary Cardiologist: Joel Harp MD Renae Gloss   HPI:  The patient is a very pleasant 69 year old mildly overweight Caucasian male, father of 1 child, who I last saw a year ago. He has a history of CAD status post coronary artery bypass surgery x6 in February 2001. He had a LIMA to his LAD, a RIMA to his PDA, right radial to the first obtuse marginal branch, diagonal branch, second marginal branch, and posterolateral branches. His other problems include treated hypertension, dyslipidemia, and chronic right bundle branch block. He denies chest pain or shortness of breath. Dr. Nicki Reaper has checked his lipid profile in the past. He had a Myoview stress test performed October 31, which was entirely normal. Since I saw him year ago he's been completely asymptomatic. He had a Myoview stress test performed in the office 05/15/14 which is entirely normal as was a 2-D echocardiogram.  Current Outpatient Prescriptions  Medication Sig Dispense Refill  . aspirin 81 MG tablet Take 81 mg by mouth daily.    Marland Kitchen atorvastatin (LIPITOR) 40 MG tablet Take 40 mg by mouth daily.    . metoprolol succinate (TOPROL-XL) 50 MG 24 hr tablet Take 1 tablet (50 mg total) by mouth daily. Take with or immediately following a meal. 30 tablet 5  . pantoprazole (PROTONIX) 40 MG tablet Take 1 tablet (40 mg total) by mouth daily. 30 tablet 5  . ramipril (ALTACE) 5 MG capsule TAKE 1 CAPSULE DAILY 30 capsule 0  . sitaGLIPtin-metformin (JANUMET) 50-1000 MG per tablet Take 1 tablet by mouth daily before breakfast.     No current facility-administered medications for this visit.    No Known Allergies  Social History   Social History  . Marital Status: Married    Spouse Name: N/A  . Number of Children: N/A  . Years of Education: N/A   Occupational History  . Not on file.   Social History Main Topics  . Smoking  status: Never Smoker   . Smokeless tobacco: Never Used  . Alcohol Use: Yes  . Drug Use: No  . Sexual Activity: Not on file   Other Topics Concern  . Not on file   Social History Narrative     Review of Systems: General: negative for chills, fever, night sweats or weight changes.  Cardiovascular: negative for chest pain, dyspnea on exertion, edema, orthopnea, palpitations, paroxysmal nocturnal dyspnea or shortness of breath Dermatological: negative for rash Respiratory: negative for cough or wheezing Urologic: negative for hematuria Abdominal: negative for nausea, vomiting, diarrhea, bright red blood per rectum, melena, or hematemesis Neurologic: negative for visual changes, syncope, or dizziness All other systems reviewed and are otherwise negative except as noted above.    Blood pressure 148/80, pulse 62, height 5\' 11"  (1.803 m), weight 201 lb (91.173 kg).  General appearance: alert and no distress Neck: no adenopathy, no carotid bruit, no JVD, supple, symmetrical, trachea midline and thyroid not enlarged, symmetric, no tenderness/mass/nodules Lungs: clear to auscultation bilaterally Heart: regular rate and rhythm, S1, S2 normal, no murmur, click, rub or gallop Extremities: extremities normal, atraumatic, no cyanosis or edema  EKG sinus rhythm at 62 with right bundle branch block. I personally reviewed this EKG  ASSESSMENT AND PLAN:   Right bundle branch block chronic  Hyperlipidemia with target LDL less than 130 History of hyperlipidemia atorvastatin 40 followed by his PCP  Essential hypertension History of hypertension  with blood pressure measured today 140/80. He is on metoprolol and ramipril. Continue current meds at current dosing  Coronary artery disease History of coronary artery disease status post bypass grafting 2/01 with a LIMA to his LAD, RIMA to the PDA, right radial to the first obtuse marginal branch, diagonal branch, second marginal branch and posterior  lateral branches. He had a Myoview stress test performed in February of this year which was nonischemic and 2-D echo which was normal as well. He denies chest pain or shortness of breath      Joel Harp MD [facility], [facility] 02/23/2015 2:57 PM

## 2015-02-23 NOTE — Assessment & Plan Note (Signed)
History of coronary artery disease status post bypass grafting 2/01 with a LIMA to his LAD, RIMA to the PDA, right radial to the first obtuse marginal branch, diagonal branch, second marginal branch and posterior lateral branches. He had a Myoview stress test performed in February of this year which was nonischemic and 2-D echo which was normal as well. He denies chest pain or shortness of breath

## 2015-02-23 NOTE — Patient Instructions (Signed)

## 2015-03-05 DIAGNOSIS — L820 Inflamed seborrheic keratosis: Secondary | ICD-10-CM | POA: Diagnosis not present

## 2015-03-05 DIAGNOSIS — Z85828 Personal history of other malignant neoplasm of skin: Secondary | ICD-10-CM | POA: Diagnosis not present

## 2015-03-09 DIAGNOSIS — Z23 Encounter for immunization: Secondary | ICD-10-CM | POA: Diagnosis not present

## 2015-04-12 ENCOUNTER — Other Ambulatory Visit: Payer: Self-pay | Admitting: Cardiovascular Disease

## 2015-04-12 MED ORDER — METOPROLOL SUCCINATE ER 50 MG PO TB24: 50.0000 mg | ORAL_TABLET | Freq: Every day | ORAL | Status: DC

## 2015-04-20 ENCOUNTER — Encounter: Payer: Self-pay | Admitting: Gastroenterology

## 2015-04-23 DIAGNOSIS — J111 Influenza due to unidentified influenza virus with other respiratory manifestations: Secondary | ICD-10-CM | POA: Diagnosis not present

## 2015-06-23 DIAGNOSIS — D1801 Hemangioma of skin and subcutaneous tissue: Secondary | ICD-10-CM | POA: Diagnosis not present

## 2015-06-23 DIAGNOSIS — L814 Other melanin hyperpigmentation: Secondary | ICD-10-CM | POA: Diagnosis not present

## 2015-06-23 DIAGNOSIS — Z85828 Personal history of other malignant neoplasm of skin: Secondary | ICD-10-CM | POA: Diagnosis not present

## 2015-06-23 DIAGNOSIS — L570 Actinic keratosis: Secondary | ICD-10-CM | POA: Diagnosis not present

## 2015-07-05 DIAGNOSIS — E1165 Type 2 diabetes mellitus with hyperglycemia: Secondary | ICD-10-CM | POA: Diagnosis not present

## 2015-07-05 DIAGNOSIS — E782 Mixed hyperlipidemia: Secondary | ICD-10-CM | POA: Diagnosis not present

## 2015-07-05 DIAGNOSIS — Z0000 Encounter for general adult medical examination without abnormal findings: Secondary | ICD-10-CM | POA: Diagnosis not present

## 2015-07-05 DIAGNOSIS — I10 Essential (primary) hypertension: Secondary | ICD-10-CM | POA: Diagnosis not present

## 2015-07-05 DIAGNOSIS — E785 Hyperlipidemia, unspecified: Secondary | ICD-10-CM | POA: Diagnosis not present

## 2015-07-05 DIAGNOSIS — Z79899 Other long term (current) drug therapy: Secondary | ICD-10-CM | POA: Diagnosis not present

## 2015-07-05 DIAGNOSIS — I2510 Atherosclerotic heart disease of native coronary artery without angina pectoris: Secondary | ICD-10-CM | POA: Diagnosis not present

## 2015-07-21 DIAGNOSIS — Z1211 Encounter for screening for malignant neoplasm of colon: Secondary | ICD-10-CM | POA: Diagnosis not present

## 2015-08-17 DIAGNOSIS — Z79899 Other long term (current) drug therapy: Secondary | ICD-10-CM | POA: Diagnosis not present

## 2015-08-19 DIAGNOSIS — E1165 Type 2 diabetes mellitus with hyperglycemia: Secondary | ICD-10-CM | POA: Diagnosis not present

## 2015-08-19 DIAGNOSIS — I10 Essential (primary) hypertension: Secondary | ICD-10-CM | POA: Diagnosis not present

## 2015-12-13 DIAGNOSIS — Z85828 Personal history of other malignant neoplasm of skin: Secondary | ICD-10-CM | POA: Diagnosis not present

## 2015-12-13 DIAGNOSIS — D485 Neoplasm of uncertain behavior of skin: Secondary | ICD-10-CM | POA: Diagnosis not present

## 2015-12-13 DIAGNOSIS — L578 Other skin changes due to chronic exposure to nonionizing radiation: Secondary | ICD-10-CM | POA: Diagnosis not present

## 2015-12-13 DIAGNOSIS — L110 Acquired keratosis follicularis: Secondary | ICD-10-CM | POA: Diagnosis not present

## 2016-01-10 ENCOUNTER — Other Ambulatory Visit: Payer: Self-pay | Admitting: Cardiovascular Disease

## 2016-01-11 ENCOUNTER — Other Ambulatory Visit: Payer: Self-pay | Admitting: Cardiovascular Disease

## 2016-02-02 DIAGNOSIS — Z23 Encounter for immunization: Secondary | ICD-10-CM | POA: Diagnosis not present

## 2016-02-02 DIAGNOSIS — T1502XA Foreign body in cornea, left eye, initial encounter: Secondary | ICD-10-CM | POA: Diagnosis not present

## 2016-02-10 ENCOUNTER — Other Ambulatory Visit: Payer: Self-pay | Admitting: Cardiovascular Disease

## 2016-04-28 DIAGNOSIS — H2513 Age-related nuclear cataract, bilateral: Secondary | ICD-10-CM | POA: Diagnosis not present

## 2016-04-28 DIAGNOSIS — E113291 Type 2 diabetes mellitus with mild nonproliferative diabetic retinopathy without macular edema, right eye: Secondary | ICD-10-CM | POA: Diagnosis not present

## 2016-04-28 DIAGNOSIS — H524 Presbyopia: Secondary | ICD-10-CM | POA: Diagnosis not present

## 2016-04-28 DIAGNOSIS — H25041 Posterior subcapsular polar age-related cataract, right eye: Secondary | ICD-10-CM | POA: Diagnosis not present

## 2016-08-09 DIAGNOSIS — E1165 Type 2 diabetes mellitus with hyperglycemia: Secondary | ICD-10-CM | POA: Diagnosis not present

## 2016-08-09 DIAGNOSIS — Z1389 Encounter for screening for other disorder: Secondary | ICD-10-CM | POA: Diagnosis not present

## 2016-08-09 DIAGNOSIS — Z0000 Encounter for general adult medical examination without abnormal findings: Secondary | ICD-10-CM | POA: Diagnosis not present

## 2016-08-09 DIAGNOSIS — Z6827 Body mass index (BMI) 27.0-27.9, adult: Secondary | ICD-10-CM | POA: Diagnosis not present

## 2016-11-01 ENCOUNTER — Other Ambulatory Visit: Payer: Self-pay | Admitting: Cardiovascular Disease

## 2017-01-10 ENCOUNTER — Other Ambulatory Visit: Payer: Self-pay | Admitting: Cardiovascular Disease

## 2017-02-09 DIAGNOSIS — Z23 Encounter for immunization: Secondary | ICD-10-CM | POA: Diagnosis not present

## 2017-02-21 DIAGNOSIS — Z1339 Encounter for screening examination for other mental health and behavioral disorders: Secondary | ICD-10-CM | POA: Diagnosis not present

## 2017-02-21 DIAGNOSIS — E1165 Type 2 diabetes mellitus with hyperglycemia: Secondary | ICD-10-CM | POA: Diagnosis not present

## 2017-02-21 DIAGNOSIS — Z79899 Other long term (current) drug therapy: Secondary | ICD-10-CM | POA: Diagnosis not present

## 2017-02-21 DIAGNOSIS — E782 Mixed hyperlipidemia: Secondary | ICD-10-CM | POA: Diagnosis not present

## 2017-02-21 DIAGNOSIS — Z1331 Encounter for screening for depression: Secondary | ICD-10-CM | POA: Diagnosis not present

## 2017-02-21 DIAGNOSIS — R2689 Other abnormalities of gait and mobility: Secondary | ICD-10-CM | POA: Diagnosis not present

## 2017-02-21 DIAGNOSIS — Z125 Encounter for screening for malignant neoplasm of prostate: Secondary | ICD-10-CM | POA: Diagnosis not present

## 2017-02-28 DIAGNOSIS — R51 Headache: Secondary | ICD-10-CM | POA: Diagnosis not present

## 2017-02-28 DIAGNOSIS — R2689 Other abnormalities of gait and mobility: Secondary | ICD-10-CM | POA: Diagnosis not present

## 2017-03-04 ENCOUNTER — Emergency Department (HOSPITAL_COMMUNITY): Payer: Medicare Other

## 2017-03-04 ENCOUNTER — Other Ambulatory Visit: Payer: Self-pay

## 2017-03-04 ENCOUNTER — Encounter (HOSPITAL_COMMUNITY): Payer: Self-pay

## 2017-03-04 ENCOUNTER — Inpatient Hospital Stay (HOSPITAL_COMMUNITY)
Admission: EM | Admit: 2017-03-04 | Discharge: 2017-03-05 | DRG: 066 | Disposition: A | Discharge status: Home / Self Care | Payer: Medicare Other | Attending: Internal Medicine | Admitting: Internal Medicine

## 2017-03-04 DIAGNOSIS — I6340 Cerebral infarction due to embolism of unspecified cerebral artery: Secondary | ICD-10-CM | POA: Diagnosis not present

## 2017-03-04 DIAGNOSIS — Z951 Presence of aortocoronary bypass graft: Secondary | ICD-10-CM

## 2017-03-04 DIAGNOSIS — R4020 Unspecified coma: Secondary | ICD-10-CM | POA: Diagnosis not present

## 2017-03-04 DIAGNOSIS — I10 Essential (primary) hypertension: Secondary | ICD-10-CM | POA: Diagnosis not present

## 2017-03-04 DIAGNOSIS — I2510 Atherosclerotic heart disease of native coronary artery without angina pectoris: Secondary | ICD-10-CM | POA: Diagnosis present

## 2017-03-04 DIAGNOSIS — R269 Unspecified abnormalities of gait and mobility: Secondary | ICD-10-CM

## 2017-03-04 DIAGNOSIS — R2681 Unsteadiness on feet: Secondary | ICD-10-CM | POA: Diagnosis not present

## 2017-03-04 DIAGNOSIS — G20 Parkinson's disease: Secondary | ICD-10-CM | POA: Diagnosis present

## 2017-03-04 DIAGNOSIS — Z7982 Long term (current) use of aspirin: Secondary | ICD-10-CM

## 2017-03-04 DIAGNOSIS — Z7984 Long term (current) use of oral hypoglycemic drugs: Secondary | ICD-10-CM

## 2017-03-04 DIAGNOSIS — Z7902 Long term (current) use of antithrombotics/antiplatelets: Secondary | ICD-10-CM

## 2017-03-04 DIAGNOSIS — E785 Hyperlipidemia, unspecified: Secondary | ICD-10-CM | POA: Diagnosis present

## 2017-03-04 DIAGNOSIS — R270 Ataxia, unspecified: Secondary | ICD-10-CM | POA: Diagnosis not present

## 2017-03-04 DIAGNOSIS — G8929 Other chronic pain: Secondary | ICD-10-CM | POA: Diagnosis not present

## 2017-03-04 DIAGNOSIS — I6789 Other cerebrovascular disease: Secondary | ICD-10-CM | POA: Diagnosis not present

## 2017-03-04 DIAGNOSIS — R531 Weakness: Secondary | ICD-10-CM | POA: Diagnosis not present

## 2017-03-04 DIAGNOSIS — E119 Type 2 diabetes mellitus without complications: Secondary | ICD-10-CM | POA: Diagnosis present

## 2017-03-04 DIAGNOSIS — I639 Cerebral infarction, unspecified: Secondary | ICD-10-CM | POA: Diagnosis present

## 2017-03-04 DIAGNOSIS — I999 Unspecified disorder of circulatory system: Secondary | ICD-10-CM

## 2017-03-04 DIAGNOSIS — G214 Vascular parkinsonism: Secondary | ICD-10-CM

## 2017-03-04 DIAGNOSIS — M542 Cervicalgia: Secondary | ICD-10-CM | POA: Diagnosis not present

## 2017-03-04 HISTORY — DX: Type 2 diabetes mellitus without complications: E11.9

## 2017-03-04 LAB — COMPREHENSIVE METABOLIC PANEL
ALK PHOS: 58 U/L (ref 38–126)
ALT: 20 U/L (ref 17–63)
ANION GAP: 9 (ref 5–15)
AST: 25 U/L (ref 15–41)
Albumin: 3.7 g/dL (ref 3.5–5.0)
BUN: 11 mg/dL (ref 6–20)
CALCIUM: 8.5 mg/dL — AB (ref 8.9–10.3)
CO2: 23 mmol/L (ref 22–32)
CREATININE: 1.04 mg/dL (ref 0.61–1.24)
Chloride: 105 mmol/L (ref 101–111)
Glucose, Bld: 184 mg/dL — ABNORMAL HIGH (ref 65–99)
Potassium: 3.9 mmol/L (ref 3.5–5.1)
Sodium: 137 mmol/L (ref 135–145)
TOTAL PROTEIN: 6.2 g/dL — AB (ref 6.5–8.1)
Total Bilirubin: 0.9 mg/dL (ref 0.3–1.2)

## 2017-03-04 LAB — DIFFERENTIAL
BASOS ABS: 0.1 10*3/uL (ref 0.0–0.1)
BASOS PCT: 1 %
EOS ABS: 0.3 10*3/uL (ref 0.0–0.7)
Eosinophils Relative: 5 %
LYMPHS ABS: 1.5 10*3/uL (ref 0.7–4.0)
Lymphocytes Relative: 23 %
Monocytes Absolute: 0.4 10*3/uL (ref 0.1–1.0)
Monocytes Relative: 7 %
NEUTROS ABS: 4.1 10*3/uL (ref 1.7–7.7)
NEUTROS PCT: 64 %

## 2017-03-04 LAB — CBC
HCT: 38.6 % — ABNORMAL LOW (ref 39.0–52.0)
Hemoglobin: 13 g/dL (ref 13.0–17.0)
MCH: 31.5 pg (ref 26.0–34.0)
MCHC: 33.7 g/dL (ref 30.0–36.0)
MCV: 93.5 fL (ref 78.0–100.0)
PLATELETS: 223 10*3/uL (ref 150–400)
RBC: 4.13 MIL/uL — AB (ref 4.22–5.81)
RDW: 13.2 % (ref 11.5–15.5)
WBC: 6.4 10*3/uL (ref 4.0–10.5)

## 2017-03-04 LAB — I-STAT CHEM 8, ED
BUN: 12 mg/dL (ref 6–20)
CHLORIDE: 101 mmol/L (ref 101–111)
Calcium, Ion: 1.13 mmol/L — ABNORMAL LOW (ref 1.15–1.40)
Creatinine, Ser: 0.9 mg/dL (ref 0.61–1.24)
Glucose, Bld: 188 mg/dL — ABNORMAL HIGH (ref 65–99)
HEMATOCRIT: 38 % — AB (ref 39.0–52.0)
Hemoglobin: 12.9 g/dL — ABNORMAL LOW (ref 13.0–17.0)
Potassium: 3.9 mmol/L (ref 3.5–5.1)
SODIUM: 141 mmol/L (ref 135–145)
TCO2: 28 mmol/L (ref 22–32)

## 2017-03-04 LAB — ETHANOL: Alcohol, Ethyl (B): <10 mg/dL (ref <10)

## 2017-03-04 LAB — TSH: TSH: 2.254 u[IU]/mL (ref 0.350–4.500)

## 2017-03-04 LAB — RAPID URINE DRUG SCREEN, HOSP PERFORMED
AMPHETAMINES: NOT DETECTED
Barbiturates: NOT DETECTED
Benzodiazepines: NOT DETECTED
Cocaine: NOT DETECTED
OPIATES: NOT DETECTED
Tetrahydrocannabinol: NOT DETECTED

## 2017-03-04 LAB — APTT: APTT: 29 s (ref 24–36)

## 2017-03-04 LAB — I-STAT TROPONIN, ED: TROPONIN I, POC: 0.01 ng/mL (ref 0.00–0.08)

## 2017-03-04 LAB — PROTIME-INR
INR: 0.99
PROTHROMBIN TIME: 13 s (ref 11.4–15.2)

## 2017-03-04 LAB — MAGNESIUM: MAGNESIUM: 1.4 mg/dL — AB (ref 1.7–2.4)

## 2017-03-04 NOTE — ED Notes (Signed)
Patient transported to MRI 

## 2017-03-04 NOTE — ED Triage Notes (Signed)
Pt from home by EMS for a dizzy spell that last 20-30sec. Pt then called his PCP and they wanted pt to follow up with THE ED.

## 2017-03-04 NOTE — ED Notes (Signed)
ED Provider at bedside. 

## 2017-03-04 NOTE — ED Provider Notes (Signed)
[facility] EMERGENCY DEPARTMENT Provider Note   CSN: [id number] Arrival date & time: 03/04/17  1713     History   Chief Complaint Chief Complaint  Patient presents with  . Dizziness    HPI Joel Gonzales. is a 71 y.o. male.    The history is provided by the patient, medical records and the spouse. No language interpreter was used.  Dizziness  Quality:  Imbalance Severity:  Severe Onset quality:  Sudden Duration: several minutes. Timing:  Intermittent Progression:  Resolved Chronicity:  Recurrent Relieved by:  Nothing Worsened by:  Standing up Ineffective treatments:  None tried Associated symptoms: no chest pain, no diarrhea, no headaches, no nausea, no palpitations, no shortness of breath, no syncope, no vision changes, no vomiting and no weakness   Risk factors: heart disease     Past Medical History:  Diagnosis Date  . CAD (coronary artery disease)   . Diabetes mellitus without complication (HCC)    Type 2  . Family history of ASCVD   . History of stress test 01/2012   which was entirely normal  . Hx of CABG    x6 LIma to his LAD, a RIMA to his PDA, right radial to obtuse marginal branch, diagnonal, second marginal, & posteolateral branches  . Hyperlipemia   . Hypertension   . RBBB    chronic    Patient Active Problem List   Diagnosis Date Noted  . Coronary artery disease 02/13/2013  . Essential hypertension 02/13/2013  . Hyperlipidemia with target LDL less than 130 02/13/2013  . Right bundle branch block 02/13/2013    Past Surgical History:  Procedure Laterality Date  . CHOLECYSTECTOMY  2002  . CORONARY ARTERY BYPASS GRAFT     x6 LIma to his LAD, a RIMA to his PDA, right radial to obtuse marginal branch, diagnonal, second marginal, & posteolateral branches  . NOSE SURGERY  02/2013   carcinoma  . TONSILLECTOMY     as a child       Home Medications    Prior to Admission medications   Medication Sig Start Date End  Date Taking? Authorizing Provider  aspirin 81 MG tablet Take 81 mg by mouth daily.    [provider]  atorvastatin (LIPITOR) 40 MG tablet Take 1 tablet (40 mg total) by mouth daily. 02/23/15   Lorretta Harp, MD  metoprolol succinate (TOPROL-XL) 50 MG 24 hr tablet TAKE 1 TABLET WITH OR IMMEDIATELY FOLLOWING A MEAL ONCE A DAY. 11/01/16   Lorretta Harp, MD  pantoprazole (PROTONIX) 40 MG tablet Take 1 tablet (40 mg total) by mouth daily. 02/23/15   Lorretta Harp, MD  ramipril (ALTACE) 5 MG capsule TAKE 1 CAPSULE DAILY. 01/11/16   Lorretta Harp, MD  ramipril (ALTACE) 5 MG capsule TAKE 1 CAPSULE DAILY. 01/11/17   Lorretta Harp, MD  sitaGLIPtin-metformin (JANUMET) 50-1000 MG per tablet Take 1 tablet by mouth daily before breakfast.    [provider]    Family History Family History  Problem Relation Age of Onset  . Heart attack Father 78       deceased, ASCVD  . Hypertension Father   . Heart disease Father   . Heart Problems Mother        ASCVD  . Hypertension Mother   . Heart attack Sister   . Hypertension Brother     Social History Social History   Tobacco Use  . Smoking status: Never Smoker  . Smokeless  tobacco: Never Used  Substance Use Topics  . Alcohol use: Yes  . Drug use: No     Allergies   Patient has no known allergies.   Review of Systems Review of Systems  Constitutional: Negative for chills, diaphoresis, fatigue and fever.  HENT: Negative for congestion.   Eyes: Negative for photophobia and visual disturbance.  Respiratory: Negative for chest tightness and shortness of breath.   Cardiovascular: Negative for chest pain, palpitations and syncope.  Gastrointestinal: Negative for abdominal pain, constipation, diarrhea, nausea and vomiting.  Genitourinary: Negative for dysuria.  Musculoskeletal: Negative for back pain, neck pain and neck stiffness.  Neurological: Positive for dizziness and speech difficulty. Negative for  syncope, facial asymmetry, weakness, light-headedness, numbness and headaches.  Psychiatric/Behavioral: Negative for agitation and confusion.  All other systems reviewed and are negative.    Physical Exam Updated Vital Signs BP (!) 195/84   Pulse 64   Temp 98.1 F (36.7 C) (Oral)   Resp 12   Ht 6' (1.829 m)   Wt 87.1 kg (192 lb)   SpO2 99%   BMI 26.04 kg/m   Physical Exam  Constitutional: He is oriented to person, place, and time. He appears well-developed and well-nourished. No distress.  HENT:  Head: Normocephalic.  Mouth/Throat: Oropharynx is clear and moist. No oropharyngeal exudate.  Eyes: EOM are normal. Pupils are equal, round, and reactive to light.  Neck: Normal range of motion.  Cardiovascular: Normal rate and intact distal pulses.  No murmur heard. Pulmonary/Chest: Effort normal. No respiratory distress. He has no wheezes. He has no rales. He exhibits no tenderness.  Abdominal: Soft.  Musculoskeletal: He exhibits no tenderness.  Neurological: He is alert and oriented to person, place, and time. No cranial nerve deficit or sensory deficit. He exhibits normal muscle tone. Coordination normal.  Skin: Capillary refill takes less than 2 seconds. He is not diaphoretic. No erythema. No pallor.  Nursing note and vitals reviewed.    ED Treatments / Results  Labs (all labs ordered are listed, but only abnormal results are displayed) Labs Reviewed  CBC - Abnormal; Notable for the following components:      Result Value   RBC 4.13 (*)    HCT 38.6 (*)    All other components within normal limits  COMPREHENSIVE METABOLIC PANEL - Abnormal; Notable for the following components:   Glucose, Bld 184 (*)    Calcium 8.5 (*)    Total Protein 6.2 (*)    All other components within normal limits  MAGNESIUM - Abnormal; Notable for the following components:   Magnesium 1.4 (*)    All other components within normal limits  I-STAT CHEM 8, ED - Abnormal; Notable for the following  components:   Glucose, Bld 188 (*)    Calcium, Ion 1.13 (*)    Hemoglobin 12.9 (*)    HCT 38.0 (*)    All other components within normal limits  ETHANOL  PROTIME-INR  APTT  DIFFERENTIAL  RAPID URINE DRUG SCREEN, HOSP PERFORMED  TSH  URINALYSIS, ROUTINE W REFLEX MICROSCOPIC  CK  I-STAT TROPONIN, ED    EKG  EKG Interpretation  Date/Time:  Sunday March 04 2017 17:29:24 EST Ventricular Rate:  63 PR Interval:    QRS Duration: 155 QT Interval:  435 QTC Calculation: 446 R Axis:   50 Text Interpretation:  Sinus rhythm Ventricular premature complex Right bundle branch block When compared to prior, t wave inversions in leads V2-V3 new compared to prior. PVC present.  No STEMI  Confirmed by Antony Blackbird [phone]) on 03/04/2017 5:40:08 PM       Radiology Ct Head Wo Contrast  Result Date: 03/04/2017 CLINICAL DATA:  71 year old male with altered level of consciousness and vertigo for several weeks. EXAM: CT HEAD WITHOUT CONTRAST TECHNIQUE: Contiguous axial images were obtained from the base of the skull through the vertex without intravenous contrast. COMPARISON:  02/28/2017 MR FINDINGS: Brain: No evidence of acute infarction, hemorrhage, hydrocephalus, extra-axial collection or mass lesion/mass effect. Moderate chronic small-vessel white matter ischemic changes are again noted. Vascular: Atherosclerotic calcifications noted. Skull: Normal. Negative for fracture or focal lesion. Sinuses/Orbits: No acute finding. Other: None IMPRESSION: 1. No evidence of acute intracranial abnormality 2. Moderate chronic small-vessel white matter ischemic changes. Electronically Signed   By: Margarette Canada M.D.   On: 03/04/2017 19:38   Mr Brain Wo Contrast  Result Date: 03/05/2017 CLINICAL DATA:  Initial evaluation for slowly progressive ataxia 4 months. EXAM: MRI HEAD WITHOUT CONTRAST MRI CERVICAL SPINE WITHOUT CONTRAST TECHNIQUE: Multiplanar, multiecho pulse sequences of the brain and surrounding structures,  and cervical spine, to include the craniocervical junction and cervicothoracic junction, were obtained without intravenous contrast. COMPARISON:  Prior CT from earlier the same day as well as recent MRI from 02/28/2017. FINDINGS: MRI HEAD FINDINGS Brain: Generalized age related cerebral atrophy. Patchy and confluent T2/FLAIR hyperintensity involving the periventricular deep white matter both cerebral hemispheres, most consistent with chronic small vessel ischemic disease, moderate nature. Punctate 4 mm focus of diffusion abnormality seen involving the subcortical white matter of the left parietal lobe (series 3, image 31). Corresponding signal loss seen on ADC map (series 350, image 31). This is also seen on coronal DWI sequence (series 5, image 8). Finding is new relative to most recent MRI, and suspicious for a tiny acute small vessel type infarct. No associated hemorrhage. No other evidence for acute or subacute ischemia. Gray-white matter differentiation otherwise maintained. No other evidence for chronic infarction. No acute intracranial hemorrhage. Multiple subcentimeter foci of susceptibility artifact seen throughout the brain, primarily involving the cerebellum and deep gray nuclei, most consistent with small chronic micro hemorrhages. Findings are suspected to be related to underlying chronic hypertension. No mass lesion, midline shift or mass effect. No hydrocephalus. No extra-axial fluid collection. Major dural sinuses are grossly patent. Pituitary gland suprasellar region within normal limits. Midline structures intact and normal. Vascular: Major intracranial vascular flow voids are maintained. Skull and upper cervical spine: Craniocervical junction normal. Bone marrow signal intensity within normal limits. No scalp soft tissue abnormality. Sinuses/Orbits: Globes and orbital soft tissues within normal limits. Mild scattered mucosal thickening within the ethmoidal air cells and right maxillary sinus.  Right maxillary sinus retention cyst partially visualized. Paranasal sinuses are otherwise clear. No mastoid effusion. Inner ear structures normal. Other: None. MRI CERVICAL SPINE FINDINGS Alignment: Mild straightening of the normal cervical lordosis. Trace retrolisthesis of C5 on C6. Vertebrae: Vertebral body heights maintained without evidence for acute or chronic fracture. Bone marrow signal intensity within normal limits. No discrete or worrisome osseous lesions. The mild reactive edema about the left C2-3 facet due to facet arthritis (series 4, image 15). Cord: Signal intensity within the cervical spinal cord is normal. Posterior Fossa, vertebral arteries, paraspinal tissues: Craniocervical junction normal. Paraspinous and prevertebral soft tissues normal. Normal intravascular flow voids present within the vertebral arteries bilaterally. Disc levels: C2-C3: No significant disc bulge. Advanced left-sided facet degeneration with associated reactive edema. Asymmetric perfusion within the left C2-3 facet. Resultant mild left C3 foraminal stenosis. No  significant canal or right foraminal encroachment. C3-C4: Mild diffuse disc bulge. Right-sided uncovertebral hypertrophy. No significant canal stenosis. Moderate right C4 foraminal narrowing. No significant left foraminal encroachment. C4-C5: Mild diffuse disc bulge. Bilateral uncovertebral hypertrophy, right greater than left. Bulging disc mildly flattens the ventral thecal sac without significant spinal stenosis. Moderate right with mild left C5 foraminal narrowing. C5-C6: Diffuse circumferential disc osteophyte complex. Bilateral uncovertebral spurring, left greater than right. Broad posterior component flattens and partially faces the ventral degenerative results in mild spinal stenosis. Superimposed mild ligamentum flavum thickening. No significant cord deformity. Severe left with moderate right C6 foraminal narrowing. C6-C7: Diffuse disc bulge with bilateral  uncovertebral hypertrophy. Bulging disc mildly indents the ventral thecal sac with resultant fairly mild spinal stenosis. Mild to moderate bilateral C7 foraminal stenosis, slightly worse on the left. C7-T1: Mild bilateral facet hypertrophy, slightly worse on the right. No significant canal or foraminal stenosis. Visualized upper thoracic spine within normal limits. IMPRESSION: MRI HEAD IMPRESSION: 1. Punctate 4 mm acute ischemic nonhemorrhagic small vessel type infarct involving the subcortical white matter of the left parietal lobe. 2. No other acute intracranial abnormality. 3. Moderate chronic small vessel ischemic disease, stable. 4. Multiple scattered chronic micro hemorrhages throughout the brain, similar to previous, again favored to reflect the sequelae of chronic hypertension. MRI CERVICAL SPINE IMPRESSION: 1. No acute abnormality within the cervical spine. 2. 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 3. Multifactorial degenerative changes with resultant multilevel foraminal narrowing as above. Notable findings include moderate right C4, C5, and C6 foraminal stenosis, severe left C6 foraminal narrowing, with mild to moderate bilateral C7 foraminal stenosis. 4. Reactive edema about the left C2-3 facet due to facet arthritis. Finding could serve as a source for neck pain. Electronically Signed   By: Jeannine Boga M.D.   On: 03/05/2017 00:20   Mr Cervical Spine Wo Contrast  Result Date: 03/05/2017 CLINICAL DATA:  Initial evaluation for slowly progressive ataxia 4 months. EXAM: MRI HEAD WITHOUT CONTRAST MRI CERVICAL SPINE WITHOUT CONTRAST TECHNIQUE: Multiplanar, multiecho pulse sequences of the brain and surrounding structures, and cervical spine, to include the craniocervical junction and cervicothoracic junction, were obtained without intravenous contrast. COMPARISON:  Prior CT from earlier the same day as well as recent MRI from 02/28/2017. FINDINGS: MRI  HEAD FINDINGS Brain: Generalized age related cerebral atrophy. Patchy and confluent T2/FLAIR hyperintensity involving the periventricular deep white matter both cerebral hemispheres, most consistent with chronic small vessel ischemic disease, moderate nature. Punctate 4 mm focus of diffusion abnormality seen involving the subcortical white matter of the left parietal lobe (series 3, image 31). Corresponding signal loss seen on ADC map (series 350, image 31). This is also seen on coronal DWI sequence (series 5, image 8). Finding is new relative to most recent MRI, and suspicious for a tiny acute small vessel type infarct. No associated hemorrhage. No other evidence for acute or subacute ischemia. Gray-white matter differentiation otherwise maintained. No other evidence for chronic infarction. No acute intracranial hemorrhage. Multiple subcentimeter foci of susceptibility artifact seen throughout the brain, primarily involving the cerebellum and deep gray nuclei, most consistent with small chronic micro hemorrhages. Findings are suspected to be related to underlying chronic hypertension. No mass lesion, midline shift or mass effect. No hydrocephalus. No extra-axial fluid collection. Major dural sinuses are grossly patent. Pituitary gland suprasellar region within normal limits. Midline structures intact and normal. Vascular: Major intracranial vascular flow voids are maintained. Skull and upper cervical spine: Craniocervical junction normal.  Bone marrow signal intensity within normal limits. No scalp soft tissue abnormality. Sinuses/Orbits: Globes and orbital soft tissues within normal limits. Mild scattered mucosal thickening within the ethmoidal air cells and right maxillary sinus. Right maxillary sinus retention cyst partially visualized. Paranasal sinuses are otherwise clear. No mastoid effusion. Inner ear structures normal. Other: None. MRI CERVICAL SPINE FINDINGS Alignment: Mild straightening of the normal  cervical lordosis. Trace retrolisthesis of C5 on C6. Vertebrae: Vertebral body heights maintained without evidence for acute or chronic fracture. Bone marrow signal intensity within normal limits. No discrete or worrisome osseous lesions. The mild reactive edema about the left C2-3 facet due to facet arthritis (series 4, image 15). Cord: Signal intensity within the cervical spinal cord is normal. Posterior Fossa, vertebral arteries, paraspinal tissues: Craniocervical junction normal. Paraspinous and prevertebral soft tissues normal. Normal intravascular flow voids present within the vertebral arteries bilaterally. Disc levels: C2-C3: No significant disc bulge. Advanced left-sided facet degeneration with associated reactive edema. Asymmetric perfusion within the left C2-3 facet. Resultant mild left C3 foraminal stenosis. No significant canal or right foraminal encroachment. C3-C4: Mild diffuse disc bulge. Right-sided uncovertebral hypertrophy. No significant canal stenosis. Moderate right C4 foraminal narrowing. No significant left foraminal encroachment. C4-C5: Mild diffuse disc bulge. Bilateral uncovertebral hypertrophy, right greater than left. Bulging disc mildly flattens the ventral thecal sac without significant spinal stenosis. Moderate right with mild left C5 foraminal narrowing. C5-C6: Diffuse circumferential disc osteophyte complex. Bilateral uncovertebral spurring, left greater than right. Broad posterior component flattens and partially faces the ventral degenerative results in mild spinal stenosis. Superimposed mild ligamentum flavum thickening. No significant cord deformity. Severe left with moderate right C6 foraminal narrowing. C6-C7: Diffuse disc bulge with bilateral uncovertebral hypertrophy. Bulging disc mildly indents the ventral thecal sac with resultant fairly mild spinal stenosis. Mild to moderate bilateral C7 foraminal stenosis, slightly worse on the left. C7-T1: Mild bilateral facet  hypertrophy, slightly worse on the right. No significant canal or foraminal stenosis. Visualized upper thoracic spine within normal limits. IMPRESSION: MRI HEAD IMPRESSION: 1. Punctate 4 mm acute ischemic nonhemorrhagic small vessel type infarct involving the subcortical white matter of the left parietal lobe. 2. No other acute intracranial abnormality. 3. Moderate chronic small vessel ischemic disease, stable. 4. Multiple scattered chronic micro hemorrhages throughout the brain, similar to previous, again favored to reflect the sequelae of chronic hypertension. MRI CERVICAL SPINE IMPRESSION: 1. No acute abnormality within the cervical spine. 2. 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 3. Multifactorial degenerative changes with resultant multilevel foraminal narrowing as above. Notable findings include moderate right C4, C5, and C6 foraminal stenosis, severe left C6 foraminal narrowing, with mild to moderate bilateral C7 foraminal stenosis. 4. Reactive edema about the left C2-3 facet due to facet arthritis. Finding could serve as a source for neck pain. Electronically Signed   By: Jeannine Boga M.D.   On: 03/05/2017 00:20    Procedures Procedures (including critical care time)  Medications Ordered in ED Medications - No data to display   Initial Impression / Assessment and Plan / ED Course  I have reviewed the triage vital signs and the nursing notes.  Pertinent labs & imaging results that were available during my care of the patient were reviewed by me and considered in my medical decision making (see chart for details).     Rozell Theiler. is a 71 y.o. male with a past medical history significant for coronary bypass x6, hypertension, and hyperlipidemia who presents with  intermittent instability.  Patient reports that for the last few weeks he has been having intermittent episodes of unsteadiness and coordination problems.  He reports that almost  daily he has had episodes where he feels he cannot control his muscles correctly in all extremities.  He reports that he has had approximately 4 falls in the last 3 weeks.  He says that he saw his PCP last week and an MRI was ordered.  He was told verbally that he had findings of advanced age but they did not tell him any other findings.  Patient reports that today, while watching the [place] pain through his football game, he went to the kitchen and had difficulty with ambulation.  He reports that his legs would not do what he wanted and he stumbled into the kitchen and had to sit down.  He reports that he could not hold things with his hands on both sides.  He said this lasted for several minutes and then completely resolved.  Patient currently is having no symptoms on arrival to the emergency department.  Patient denies recent fevers, chills, headache, vision changes, facial droop, neck pain, neck stiffness, chest pain, shortness of breath, palpitations, nausea, vomiting, conservation, diarrhea, dysuria.  He denies recent medication changes.  He denies any history of stroke but does have the significant cardiac history.  He denies any vision changes but his wife reports that something was wrong with his voice when he was having the coordination problem.  Patient did not lose consciousness and remembers the episode.  On arrival, patient was found to have a blood pressure in the 190s.  He reports his blood pressure usually in the 130s and this is much higher than normal.    Initial EKG showed no STEMI.  There were T wave inversions that appear to be new compared to prior.  Patient will have workup to look for hypertensive emergency versus TIA or other cause of instability such as occult infection.  Neurology will be called and anticipate patient will be admitted for further management of his symptoms.  8:26 PM Laboratory testing is overall reassuring.  Initial troponin negative.  CT of the head showed  no acute intracranial abnormality with some small vessel disease.  Patient was ambulated without coordination problem or gait problem.  Neurology was called who recommended orthostatic vital signs and they will come see the patient.  Neurology recommended MRI of the head and neck.  If MRI is negative, patient will be stable for discharge home with outpatient neurology follow-up and workup.  MRI returned showing a new stroke.  Neurology recommends admission to hospitalist service for further workup.   Final Clinical Impressions(s) / ED Diagnoses   Final diagnoses:  Cerebrovascular accident (CVA), unspecified mechanism ([place])  Unsteadiness    Clinical Impression: 1. Cerebrovascular accident (CVA), unspecified mechanism ([place])   2. Unsteadiness     Disposition: Admit to hospitalist service    Ethel Meisenheimer, Gwenyth Allegra, MD 03/05/17 [phone]

## 2017-03-04 NOTE — Consult Note (Signed)
Neurology Consultation Reason for Consult: Unsteady gait Referring Physician: Tegeler, C  CC: Unsteady gait  History is obtained from: Patient, wife  HPI: Joel Gonzales. is a 71 y.o. male with history of progressive unsteady gait since last summer.  He states that is been getting steadily worse in the intervening timeframe.  He now notes that his gait is shuffling, and if he bends over he feels like he wants to fall forwards.   He also notes that he has been having more difficulty getting up out of chairs, having to use his arms to stand up.  He denies any exacerbating or ameliorating factors.   Today, he noted an especially bad episode where he felt uncoordinated both in his arms and legs.  There is no predilection to one side or the other.  He states that the worst part of it lasted for a few minutes, but has gotten better.  He denies any acute neck pain associated with that episode.  He does have some chronic neck pain that he states is a "crick in his neck" that comes and goes.  Due to the symptoms, he went to his PCP who ordered a brain MRI which was negative.  ROS: A 14 point ROS was performed and is negative except as noted in the HPI.   Past Medical History:  Diagnosis Date  . CAD (coronary artery disease)   . Diabetes mellitus without complication (HCC)    Type 2  . Family history of ASCVD   . History of stress test 01/2012   which was entirely normal  . Hx of CABG    x6 LIma to his LAD, a RIMA to his PDA, right radial to obtuse marginal branch, diagnonal, second marginal, & posteolateral branches  . Hyperlipemia   . Hypertension   . RBBB    chronic     Family History  Problem Relation Age of Onset  . Heart attack Father 48       deceased, ASCVD  . Hypertension Father   . Heart disease Father   . Heart Problems Mother        ASCVD  . Hypertension Mother   . Heart attack Sister   . Hypertension Brother      Social History:  reports that  has never  smoked. he has never used smokeless tobacco. He reports that he drinks alcohol. He reports that he does not use drugs.   Exam: Current vital signs: BP (!) 178/84   Pulse 75   Temp 98.1 F (36.7 C) (Oral)   Resp 18   Ht 6' (1.829 m)   Wt 87.1 kg (192 lb)   SpO2 95%   BMI 26.04 kg/m  Vital signs in last 24 hours: Temp:  [98.1 F (36.7 C)] 98.1 F (36.7 C) (12/02 1724) Pulse Rate:  [56-75] 75 (12/02 2145) Resp:  [12-24] 18 (12/02 2145) BP: (165-195)/(73-98) 178/84 (12/02 2145) SpO2:  [93 %-100 %] 95 % (12/02 2145) Weight:  [87.1 kg (192 lb)] 87.1 kg (192 lb) (12/02 1724)   Physical Exam  Constitutional: Appears well-developed and well-nourished.  Psych: Affect appropriate to situation Eyes: No scleral injection HENT: No OP obstrucion Head: Normocephalic.  Cardiovascular: Normal rate and regular rhythm.  Respiratory: Effort normal, non-labored breathing GI: Soft.  No distension. There is no tenderness.  Skin: WDI  Neuro: Mental Status: Patient is awake, alert, oriented to person, place, month, year, and situation. Patient is able to give a clear and coherent history. No  signs of aphasia or neglect Cranial Nerves: II: Visual Fields are full. Pupils are equal, round, and reactive to light.   III,IV, VI: EOMI without ptosis or diploplia.  V: Facial sensation is symmetric to temperature VII: Facial movement is symmetric.  VIII: hearing is intact to voice X: Uvula elevates symmetrically XI: Shoulder shrug is symmetric. XII: tongue is midline without atrophy or fasciculations.  Motor: Tone is normal. Bulk is normal. 5/5 strength was present in all four extremities.  Sensory: Sensation is symmetric to light touch and temperature in the arms and legs. Deep Tendon Reflexes: 2+ and symmetric in the biceps and patellae.  Plantars: Toes are downgoing bilaterally.  Cerebellar: FNF and HKS are intact bilaterally   I have reviewed labs in epic and the results pertinent to  this consultation are: CMP-unremarkable  I have reviewed the images obtained: CT head unremarkable  Impression: 72 year old male with aggressive unsteady gait over the past few months.  Given the episode today, I do think a repeat brain MRI would be prudent, but I feel this is unlikely to be stroke or TIA.  Cervical spine MRI I think would be more likely to be helpful, but no clear signs of myelopathy on exam.  Myositis would be another possibility.  Parkinsonism is possible, though there is no clear sign of that on exam.  Recommendations: 1) MRI brain and cervical spine 2) CK 3) if MRI brain and cervical spine are negative and the patient could be discharged home with outpatient neurology follow-up.   Roland Rack, MD [facility] [phone]  If 7pm- 7am, please page neurology on call as listed in [place].

## 2017-03-04 NOTE — ED Notes (Signed)
Patient transported to CT 

## 2017-03-05 ENCOUNTER — Other Ambulatory Visit: Payer: Self-pay | Admitting: Cardiology

## 2017-03-05 ENCOUNTER — Inpatient Hospital Stay (HOSPITAL_COMMUNITY): Payer: Medicare Other

## 2017-03-05 DIAGNOSIS — R269 Unspecified abnormalities of gait and mobility: Secondary | ICD-10-CM | POA: Diagnosis not present

## 2017-03-05 DIAGNOSIS — I639 Cerebral infarction, unspecified: Secondary | ICD-10-CM

## 2017-03-05 DIAGNOSIS — R2681 Unsteadiness on feet: Secondary | ICD-10-CM | POA: Diagnosis not present

## 2017-03-05 DIAGNOSIS — I6340 Cerebral infarction due to embolism of unspecified cerebral artery: Secondary | ICD-10-CM | POA: Diagnosis present

## 2017-03-05 DIAGNOSIS — G8929 Other chronic pain: Secondary | ICD-10-CM | POA: Diagnosis present

## 2017-03-05 DIAGNOSIS — I6789 Other cerebrovascular disease: Secondary | ICD-10-CM

## 2017-03-05 DIAGNOSIS — G20 Parkinson's disease: Secondary | ICD-10-CM | POA: Diagnosis present

## 2017-03-05 DIAGNOSIS — E119 Type 2 diabetes mellitus without complications: Secondary | ICD-10-CM | POA: Diagnosis present

## 2017-03-05 DIAGNOSIS — E785 Hyperlipidemia, unspecified: Secondary | ICD-10-CM

## 2017-03-05 DIAGNOSIS — Z7902 Long term (current) use of antithrombotics/antiplatelets: Secondary | ICD-10-CM | POA: Diagnosis not present

## 2017-03-05 DIAGNOSIS — I999 Unspecified disorder of circulatory system: Secondary | ICD-10-CM

## 2017-03-05 DIAGNOSIS — G214 Vascular parkinsonism: Secondary | ICD-10-CM

## 2017-03-05 DIAGNOSIS — I2510 Atherosclerotic heart disease of native coronary artery without angina pectoris: Secondary | ICD-10-CM | POA: Diagnosis present

## 2017-03-05 DIAGNOSIS — Z7984 Long term (current) use of oral hypoglycemic drugs: Secondary | ICD-10-CM | POA: Diagnosis not present

## 2017-03-05 DIAGNOSIS — Z7982 Long term (current) use of aspirin: Secondary | ICD-10-CM | POA: Diagnosis not present

## 2017-03-05 DIAGNOSIS — I10 Essential (primary) hypertension: Secondary | ICD-10-CM | POA: Diagnosis not present

## 2017-03-05 DIAGNOSIS — Z951 Presence of aortocoronary bypass graft: Secondary | ICD-10-CM | POA: Diagnosis not present

## 2017-03-05 LAB — URINALYSIS, ROUTINE W REFLEX MICROSCOPIC
Bilirubin Urine: NEGATIVE
Glucose, UA: NEGATIVE mg/dL
HGB URINE DIPSTICK: NEGATIVE
Ketones, ur: NEGATIVE mg/dL
Leukocytes, UA: NEGATIVE
NITRITE: NEGATIVE
PROTEIN: NEGATIVE mg/dL
Specific Gravity, Urine: 1.004 — ABNORMAL LOW (ref 1.005–1.030)
pH: 6 (ref 5.0–8.0)

## 2017-03-05 LAB — LIPID PANEL
Cholesterol: 122 mg/dL (ref 0–200)
HDL: 41 mg/dL (ref >40)
LDL CALC: 57 mg/dL (ref 0–99)
Total CHOL/HDL Ratio: 3 RATIO
Triglycerides: 122 mg/dL (ref <150)
VLDL: 24 mg/dL (ref 0–40)

## 2017-03-05 LAB — ECHOCARDIOGRAM COMPLETE
Height: 72 in
Weight: 3072 oz

## 2017-03-05 LAB — CK: CK TOTAL: 77 U/L (ref 49–397)

## 2017-03-05 LAB — GLUCOSE, CAPILLARY: Glucose-Capillary: 135 mg/dL — ABNORMAL HIGH (ref 65–99)

## 2017-03-05 MED ORDER — STROKE: EARLY STAGES OF RECOVERY BOOK
Freq: Once | Status: AC
  Administered 2017-03-05: 07:00:00
  Filled 2017-03-05: qty 1

## 2017-03-05 MED ORDER — IOPAMIDOL (ISOVUE-370) INJECTION 76%
INTRAVENOUS | Status: AC
  Administered 2017-03-05: 50 mL
  Filled 2017-03-05: qty 50

## 2017-03-05 MED ORDER — ACETAMINOPHEN 650 MG RE SUPP: 650.0000 mg | RECTAL | Status: DC | PRN

## 2017-03-05 MED ORDER — ACETAMINOPHEN 160 MG/5ML PO SOLN: 650.0000 mg | ORAL | Status: DC | PRN

## 2017-03-05 MED ORDER — INSULIN ASPART 100 UNIT/ML ~~LOC~~ SOLN
0.0000 [IU] | Freq: Three times a day (TID) | SUBCUTANEOUS | Status: DC
  Administered 2017-03-05: 2 [IU] via SUBCUTANEOUS

## 2017-03-05 MED ORDER — CLOPIDOGREL BISULFATE 75 MG PO TABS
75.0000 mg | ORAL_TABLET | Freq: Every day | ORAL | Status: DC
  Administered 2017-03-05: 75 mg via ORAL
  Filled 2017-03-05: qty 1

## 2017-03-05 MED ORDER — ACETAMINOPHEN 325 MG PO TABS: 650.0000 mg | ORAL_TABLET | ORAL | Status: DC | PRN

## 2017-03-05 MED ORDER — CLOPIDOGREL BISULFATE 75 MG PO TABS: 75.0000 mg | ORAL_TABLET | Freq: Every day | ORAL | 0 refills | Status: DC

## 2017-03-05 MED ORDER — ATORVASTATIN CALCIUM 40 MG PO TABS
40.0000 mg | ORAL_TABLET | Freq: Every day | ORAL | Status: DC
  Administered 2017-03-05: 40 mg via ORAL
  Filled 2017-03-05: qty 1

## 2017-03-05 MED ORDER — STROKE: EARLY STAGES OF RECOVERY BOOK
Freq: Once | Status: AC
  Administered 2017-03-05: 13:00:00

## 2017-03-05 MED ORDER — PANTOPRAZOLE SODIUM 40 MG PO TBEC
40.0000 mg | DELAYED_RELEASE_TABLET | Freq: Every day | ORAL | Status: DC
  Administered 2017-03-05: 40 mg via ORAL
  Filled 2017-03-05: qty 1

## 2017-03-05 MED ORDER — INSULIN ASPART 100 UNIT/ML ~~LOC~~ SOLN: 0.0000 [IU] | Freq: Every day | SUBCUTANEOUS | Status: DC

## 2017-03-05 MED ORDER — METOPROLOL SUCCINATE ER 25 MG PO TB24
50.0000 mg | ORAL_TABLET | Freq: Every day | ORAL | Status: DC
Start: 2017-03-05 — End: 2017-03-05
  Administered 2017-03-05: 50 mg via ORAL
  Filled 2017-03-05: qty 2

## 2017-03-05 MED ORDER — ASPIRIN 300 MG RE SUPP
300.0000 mg | Freq: Every day | RECTAL | Status: DC
  Filled 2017-03-05: qty 1

## 2017-03-05 MED ORDER — ASPIRIN 325 MG PO TABS
325.0000 mg | ORAL_TABLET | Freq: Every day | ORAL | Status: DC
  Administered 2017-03-05: 325 mg via ORAL
  Filled 2017-03-05: qty 1

## 2017-03-05 MED ORDER — METFORMIN HCL 500 MG PO TABS
1000.0000 mg | ORAL_TABLET | Freq: Two times a day (BID) | ORAL | Status: DC
  Administered 2017-03-05: 1000 mg via ORAL
  Filled 2017-03-05 (×2): qty 2

## 2017-03-05 NOTE — Evaluation (Signed)
Physical Therapy Evaluation/ Ed[facility] Patient Details Name: Joel Gonzales. MRN: [id number] DOB: 1945-12-20 Today's Date: 03/05/2017   History of Present Illness  71 y.o. male presents with unsteady gait, instances of LOB when standing from chairs, and incoordination of BUEs and BLEs that has progressively worsened since summer 2018. Head CT negative. Brain MRI positive for acute punctate ischemic infarct of left parietal lobe. 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 PMH significant of CAD, DM, CABG x6, HTN, HLD, and RBBB.  Clinical Impression  Pt presents with decreased balance, decreased coordination, and difficulty walking secondary to above. Pt reports shuffled and unsteady gait, impaired coordination of UEs and LEs, and difficulties with initiation (consistent with Parkinsonian symptoms), however pt is asymptomatic during today's PT session. Pt able to ambulate and climb stairs with min guard for safety within hospital setting. Pt educated on benefits of outpatient neuro-rehabilitation should symptoms worsen. Pt educated on taking proper safety precautions, being more aware, and limiting activity on days when his symptoms are present. Pt will benefit from outpatient PT upon discharge from hospital. No further PT services needed acutely. Pt agrees with and understands PT recommendations. Discharge from PT.     Follow Up Recommendations Outpatient PT(Outpatient Neuro Rehabilitation)    Equipment Recommendations  None recommended by PT    Recommendations for Other Services       Precautions / Restrictions Restrictions Weight Bearing Restrictions: No      Mobility  Bed Mobility Overal bed mobility: Needs Assistance Bed Mobility: Supine to Sit     Supine to sit: HOB elevated;Modified independent (Device/Increase time)     General bed mobility comments: Pt able to perform supine to sit with HOB elevated and use of handrails.    Transfers Overall transfer level: Needs assistance   Transfers: Sit to/from Stand Sit to Stand: Min guard;Supervision         General transfer comment: Sit-to-stand x1 from EOB min guard for safety, x3 from chair with supervision. Pt uses UEs on chair arms in order to aid in sit-to-stand.   Ambulation/Gait Ambulation/Gait assistance: Min guard Ambulation Distance (Feet): 350 Feet Assistive device: None Gait Pattern/deviations: Step-through pattern;Drifts right/left Gait velocity: normal with intermittent increased gait velocity   General Gait Details: Pt drifts towards right side while ambulating down hall, colliding with object on right side x1. Min guard for safety given history of unsteadiness with gait.   Stairs Stairs: Yes Stairs assistance: Min guard Stair Management: One rail Left Number of Stairs: 10 General stair comments: Pt with increased velocity while descending stairs, however was able to slow down with VC.   Wheelchair Mobility    Modified Rankin (Stroke Patients Only) Modified Rankin (Stroke Patients Only) Pre-Morbid Rankin Score: No significant disability Modified Rankin: No significant disability     Balance Overall balance assessment: Needs assistance Sitting-balance support: No upper extremity supported Sitting balance-Leahy Scale: Good Sitting balance - Comments: Pt demonstrates ability to sit EOB without UE support and shift weight while using UEs freely.    Standing balance support: No upper extremity supported;During functional activity Standing balance-Leahy Scale: Good Standing balance comment: Pt maintains balance during dynamic standing activites without external support.              High level balance activites: Head turns;Turns High Level Balance Comments: pt demsontrates ability to ambulate with horizontal and vertical head turns without change in gait speed. Pt demsontrates larger than average turn radius when turning around at  end of hall to walk in other direction.              Pertinent Vitals/Pain Pain Assessment: No/denies pain    Home Living Family/patient expects to be discharged to:: Private residence Living Arrangements: Spouse/significant other   Type of Home: House Home Access: Stairs to enter Entrance Stairs-Rails: Left Entrance Stairs-Number of Steps: 4 Home Layout: Two level;Bed/bath upstairs Home Equipment: None      Prior Function Level of Independence: Independent               Hand Dominance        Extremity/Trunk Assessment   Upper Extremity Assessment Upper Extremity Assessment: Defer to OT evaluation    Lower Extremity Assessment Lower Extremity Assessment: Overall WFL for tasks assessed    Cervical / Trunk Assessment Cervical / Trunk Assessment: Normal  Communication   Communication: No difficulties  Cognition Arousal/Alertness: Awake/alert Behavior During Therapy: WFL for tasks assessed/performed Overall Cognitive Status: Within Functional Limits for tasks assessed                                        General Comments General comments (skin integrity, edema, etc.): Pt's daughter is present in room during session. Pt voices concerns about his increasing difficulty to control movements of his extremities, sometimes resulting in LOB and falling. Pt reports decreased ability to initiate movements and control/cease movement pattern once it has commenced. 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  Pt reports that he has to hold on to things around him with UEs sometimes in order to maintain balance. Pt advised to limit his activity, especially possibly dangerous activities, on days where his symptoms are worse. Pt does not demonstrate symptoms during today's session.     Exercises     Assessment/Plan    PT Assessment Patent does not need any further PT services  PT Problem List Decreased mobility;Decreased safety awareness;Decreased  coordination       PT Treatment Interventions      PT Goals (Current goals can be found in the Care Plan section)  Acute Rehab PT Goals Patient Stated Goal: return home PT Goal Formulation: With patient/family Time For Goal Achievement: 03/19/17 Potential to Achieve Goals: Good    Frequency     Barriers to discharge        Co-evaluation               AM-PAC PT "6 Clicks" Daily Activity  Outcome Measure Difficulty turning over in bed (including adjusting bedclothes, sheets and blankets)?: None Difficulty moving from lying on back to sitting on the side of the bed? : None Difficulty sitting down on and standing up from a chair with arms (e.g., wheelchair, bedside commode, etc,.)?: A Little Help needed moving to and from a bed to chair (including a wheelchair)?: A Little Help needed walking in hospital room?: A Little Help needed climbing 3-5 steps with a railing? : A Little 6 Click Score: 20    End of Session Equipment Utilized During Treatment: Gait belt Activity Tolerance: Patient tolerated treatment well Patient left: in chair;with call bell/phone within reach;with family/visitor present   PT Visit Diagnosis: Unsteadiness on feet (R26.81);Other abnormalities of gait and mobility (R26.89);Repeated falls (R29.6)    Time: 1610-9604 PT Time Calculation (min) (ACUTE ONLY): 27 min   Charges:   PT Evaluation $PT Eval Low Complexity: 1 Low PT Treatments $Gait  Training: 8-22 mins   PT G Codes:        Judee Clara, SPT  Judee Clara 03/05/2017, 1:48 PM

## 2017-03-05 NOTE — Progress Notes (Signed)
STROKE TEAM PROGRESS NOTE   SUBJECTIVE (INTERVAL HISTORY) His wife is at the bedside.  Overall he feels his condition is completely resolved. He stated that he had episode of incoordination of both hands and gait imbalance yesterday lasting 5-10 min. By further questioning, seems right hand more clumsy presentation. However, he complains of about 6 months history of progressive episodic shuffling gait, leaning forward, stooped posturing, difficulty getting out of chair and slowness. But dennies any tremor, or fall. However, today exam in round showed normal gait and getting out of chair.    OBJECTIVE Temp:  [97.6 F (36.4 C)-98.2 F (36.8 C)] 97.6 F (36.4 C) (12/03 1200) Pulse Rate:  [51-75] 56 (12/03 1200) Cardiac Rhythm: Normal sinus rhythm;Bundle branch block (12/03 0816) Resp:  [12-24] 18 (12/03 1200) BP: (142-195)/(59-98) 190/74 (12/03 1200) SpO2:  [92 %-100 %] 97 % (12/03 0600) Weight:  [192 lb (87.1 kg)] 192 lb (87.1 kg) (12/02 1724)  Recent Labs  Lab 03/05/17 1234  GLUCAP 135*   Recent Labs  Lab 03/04/17 1823 03/04/17 1831  NA 137 141  K 3.9 3.9  CL 105 101  CO2 23  --   GLUCOSE 184* 188*  BUN 11 12  CREATININE 1.04 0.90  CALCIUM 8.5*  --   MG 1.4*  --    Recent Labs  Lab 03/04/17 1823  AST 25  ALT 20  ALKPHOS 58  BILITOT 0.9  PROT 6.2*  ALBUMIN 3.7   Recent Labs  Lab 03/04/17 1823 03/04/17 1831  WBC 6.4  --   NEUTROABS 4.1  --   HGB 13.0 12.9*  HCT 38.6* 38.0*  MCV 93.5  --   PLT 223  --    Recent Labs  Lab 03/05/17 0125  CKTOTAL 77   Recent Labs    03/04/17 1823  LABPROT 13.0  INR 0.99   Recent Labs    03/04/17 1823  COLORURINE STRAW*  LABSPEC 1.004*  PHURINE 6.0  GLUCOSEU NEGATIVE  HGBUR NEGATIVE  BILIRUBINUR NEGATIVE  KETONESUR NEGATIVE  PROTEINUR NEGATIVE  NITRITE NEGATIVE  LEUKOCYTESUR NEGATIVE       Component Value Date/Time   CHOL 122 03/05/2017 0503   TRIG 122 03/05/2017 0503   HDL 41 03/05/2017 0503   CHOLHDL  3.0 03/05/2017 0503   VLDL 24 03/05/2017 0503   LDLCALC 57 03/05/2017 0503   No results found for: HGBA1C    Component Value Date/Time   LABOPIA NONE DETECTED 03/04/2017 1823   COCAINSCRNUR NONE DETECTED 03/04/2017 1823   LABBENZ NONE DETECTED 03/04/2017 1823   AMPHETMU NONE DETECTED 03/04/2017 1823   THCU NONE DETECTED 03/04/2017 1823   LABBARB NONE DETECTED 03/04/2017 1823    Recent Labs  Lab 03/04/17 1823  ETH <10    I have personally reviewed the radiological images below and agree with the radiology interpretations.  Ct Angio Head and neck W Or Wo Contrast 03/05/2017 IMPRESSION: 1. Patent circle of Willis with mild intracranial atherosclerosis but no flow limiting proximal stenosis or proximal branch occlusion. 2. 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 3. Minimal cervical carotid artery atherosclerosis without stenosis. Electronically Signed   By: Logan Bores M.D.   On: 03/05/2017 10:54   Ct Head Wo Contrast 03/04/2017 IMPRESSION: 1. No evidence of acute intracranial abnormality 2. Moderate chronic small-vessel white matter ischemic changes.   Mr Brain and C-spine Wo Contrast 03/05/2017 IMPRESSION: MRI HEAD IMPRESSION: 1. Punctate 4 mm acute ischemic nonhemorrhagic small vessel type infarct involving the subcortical white matter of  the left parietal lobe. 2. No other acute intracranial abnormality. 3. Moderate chronic small vessel ischemic disease, stable. 4. Multiple scattered chronic micro hemorrhages throughout the brain, similar to previous, again favored to reflect the sequelae of chronic hypertension. MRI CERVICAL SPINE IMPRESSION: 1. No acute abnormality within the cervical spine. 2. 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 3. Multifactorial degenerative changes with resultant multilevel foraminal narrowing as above. Notable findings include moderate right C4, C5, and C6 foraminal stenosis, severe left C6 foraminal  narrowing, with mild to moderate bilateral C7 foraminal stenosis. 4. Reactive edema about the left C2-3 facet due to facet arthritis. Finding could serve as a source for neck pain.   TTE pending   PHYSICAL EXAM  Temp:  [97.6 F (36.4 C)-98.2 F (36.8 C)] 97.6 F (36.4 C) (12/03 1200) Pulse Rate:  [51-75] 56 (12/03 1200) Resp:  [12-24] 18 (12/03 1200) BP: (142-195)/(59-98) 190/74 (12/03 1200) SpO2:  [92 %-100 %] 97 % (12/03 0600) Weight:  [192 lb (87.1 kg)] 192 lb (87.1 kg) (12/02 1724)  General - Well nourished, well developed, in no apparent distress.  Ophthalmologic - fundi not visualized due to noncooperation.  Cardiovascular - Regular rate and rhythm with no murmur.  Mental Status -  Level of arousal and orientation to time, place, and person were intact. Language including expression, naming, repetition, comprehension was assessed and found intact. Fund of Knowledge was assessed and was intact.  Cranial Nerves II - XII - II - Visual field intact OU. III, IV, VI - Extraocular movements intact. V - Facial sensation intact bilaterally. VII - Facial movement intact bilaterally. VIII - Hearing & vestibular intact bilaterally. X - Palate elevates symmetrically. XI - Chin turning & shoulder shrug intact bilaterally. XII - Tongue protrusion intact.  Motor Strength - The patient's strength was normal in all extremities and pronator drift was absent.  Bulk was normal and fasciculations were absent.   Motor Tone - Muscle tone was assessed at the neck and appendages and was normal.  Reflexes - The patient's reflexes were symmetrical in all extremities and he had no pathological reflexes.  Sensory - Light touch, temperature/pinprick were assessed and were symmetrical.    Coordination - The patient had normal movements in the hands and feet with no ataxia or dysmetria.  Tremor was absent.  Gait and Station - normal gait, stride, station, turns and getting out of chair with arms  folded in front of chest.   ASSESSMENT/PLAN [name]. is a 71 y.o. male with history of CAD s/p CABG, DM, HTN, HLD admitted for transient incoordination and gait imbalance. No tPA given due to symptoms resolved.    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 Continue plavix on discharge.  Patient counseled to be compliant with his antithrombotic medications  Ongoing aggressive stroke risk factor management  Therapy recommendations:  pending  Disposition:  Pending  ? Mild vascular parkinsonism  Episodic shuffling gait, stooped posturing, difficulty getting out of chair, slowness, micrographia  Not clear trigger  Progressive, now happens everyday  No tremor, no fall  Today's exam showed normal gait, getting out of chair  Recommend outpt PT/OT for balance training  Will follow up as outpt to observe and may consider trial of sinemet if continue to progress  Diabetes  HgbA1c pending goal < 7.0  Controlled  CBG monitoring  SSI  Hypertension Stable gradually normalized within 5-7 days.  Long term BP goal normotensive  Hyperlipidemia  Home meds:  lipitor   LDL 57, goal < 70  Now on lipitor  Continue statin at discharge  Other Stroke Risk Factors  Advanced age  CAD s/p CABG  Other Active Problems  none  Hospital day # 0  Neurology will sign off. Please call with questions. Pt will follow up with Dr. Erlinda Hong at [facility] in about 6 weeks.  Thanks for the consult.   Rosalin Hawking, MD PhD Stroke Neurology 03/05/2017 12:47 PM    To contact Stroke Continuity provider, please refer to [email or web address] After hours, contact General Neurology

## 2017-03-05 NOTE — Evaluation (Signed)
Occupational Therapy Evaluation and Discharge Patient Details Name: Rishikesh Khachatryan. MRN: [id number] DOB: September 21, 1945 Today's Date: 03/05/2017    History of Present Illness 71 y.o. male presents with unsteady gait, instances of LOB when standing from chairs, and incoordination of BUEs and BLEs that has progressively worsened since summer 2018. Head CT negative. Brain MRI positive for acute punctate ischemic infarct of left parietal lobe. 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 PMH significant of CAD, DM, CABG x6, HTN, HLD, and RBBB.   Clinical Impression   This 71 yo male admitted with above presents to acute OT at a S level only due to history of recent falls. He reports a gait that is normal and then turns into one with increased speed that he cannot control--this was not seen today. No further OT needs. I did recommend that he may consider walking a little slower than normal to see if this helps. Acute OT will sign off.     Follow Up Recommendations  No OT follow up    Equipment Recommendations  None recommended by OT       Precautions / Restrictions Precautions Precautions: Fall Precaution Comments: H/o falls Restrictions Weight Bearing Restrictions: No      Mobility Bed Mobility Overal bed mobility: Independent                Transfers Overall transfer level: Needs assistance Equipment used: None   Sit to Stand: Supervision         General transfer comment: Needs S for all mobility only due to recent falls         ADL either performed or assessed with clinical judgement   ADL Overall ADL's : Independent                                             Vision Patient Visual Report: No change from baseline              Pertinent Vitals/Pain Pain Assessment: No/denies pain     Hand Dominance Right   Extremity/Trunk Assessment Upper Extremity Assessment Upper Extremity Assessment: Overall  WFL for tasks assessed           Communication Communication Communication: No difficulties   Cognition Arousal/Alertness: Awake/alert Behavior During Therapy: WFL for tasks assessed/performed Overall Cognitive Status: Within Functional Limits for tasks assessed                                                Home Living Family/patient expects to be discharged to:: Private residence Living Arrangements: Spouse/significant other Available Help at Discharge: Family Type of Home: House Home Access: Stairs to enter Technical brewer of Steps: 4 Entrance Stairs-Rails: Left Home Layout: Two level;Bed/bath upstairs Alternate Level Stairs-Number of Steps: flight Alternate Level Stairs-Rails: Can reach both Bathroom Shower/Tub: Teacher, early years/pre: Standard Bathroom Accessibility: No   Home Equipment: None      Lives With: Spouse    Prior Functioning/Environment Level of Independence: Independent        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 can be found in the  care plan section) Acute Rehab OT Goals Patient Stated Goal: return home  OT Frequency:                AM-PAC PT "6 Clicks" Daily Activity     Outcome Measure Help from another person eating meals?: None Help from another person taking care of personal grooming?: None Help from another person toileting, which includes using toliet, bedpan, or urinal?: None Help from another person bathing (including washing, rinsing, drying)?: None Help from another person to put on and taking off regular upper body clothing?: None Help from another person to put on and taking off regular lower body clothing?: None 6 Click Score: 24   End of Session    Activity Tolerance: Patient tolerated treatment well Patient left: in bed;with call bell/phone within reach;with family/visitor present                   Time: 7530-0511 OT Time  Calculation (min): 21 min Charges:  OT General Charges $OT Visit: 1 Visit OT Evaluation $OT Eval Moderate Complexity: [address] [place], [place] [phone] 03/05/2017

## 2017-03-05 NOTE — Discharge Summary (Signed)
Physician Discharge Summary  Joel Gonzales. XVQ:[id number] DOB: [birth year] DOA: 03/04/2017  PCP: Myer Peer, MD  Admit date: 03/04/2017 Discharge date: 03/05/2017  Admitted From: home Disposition:  home   Discharge Condition:  stable   CODE STATUS:  Full code   Consultations:  Neuro     Discharge Diagnoses:  Principal Problem:   Acute ischemic stroke [facility]) Active Problems:   Essential hypertension   Hyperlipidemia with target LDL less than 130   Gait disturbance     Subjective: No complaints.   HPI: Joel Gonzales. is a 71 y.o. male with medical history of DM, HTN significant of progressively unsteady gait since last summer.  Gait is shuffling, loss of balance when he bends over.  Today had especially bad episode of loss of coordination in both arms and legs and was admitted for stroke work up.  Hospital Course:  Acute ischemic infarct - does not appear to have any residual symptoms from this MRI: Punctate 4 mm focus of diffusion abnormality seen involving the subcortical white matter of the left parietal lobe - LDL: 57 - CTA unrevealing- see below - ECHO: no thrombus seen - change ASA to Plavix per neuro - cont Statin  Gait difficulty - Neuro has recommended an outpt neuro eval - outpt PT/ OT also has been ordered  DM2 - hold metformin for 48 hrs due to CTA  Discharge Instructions  Discharge Instructions    Ambulatory referral to Neurology   Complete by:  As directed    Pt will follow up with Dr. Erlinda Hong at [facility] in about 2 months. Thanks.   Ambulatory referral to Physical Therapy   Complete by:  As directed    Gait balance. Pt has questionable parkinsonism features. Thanks.   Diet - low sodium heart healthy   Complete by:  As directed    Diet Carb Modified   Complete by:  As directed    Increase activity slowly   Complete by:  As directed      Allergies as of 03/05/2017   No Known Allergies     Medication List    STOP taking these medications    aspirin 81 MG tablet   metFORMIN 1000 MG tablet Commonly known as:  GLUCOPHAGE     TAKE these medications   atorvastatin 40 MG tablet Commonly known as:  LIPITOR Take 1 tablet (40 mg total) by mouth daily.   clopidogrel 75 MG tablet Commonly known as:  PLAVIX Take 1 tablet (75 mg total) by mouth daily. Start taking on:  03/06/2017   metoprolol succinate 50 MG 24 hr tablet Commonly known as:  TOPROL-XL TAKE 1 TABLET WITH OR IMMEDIATELY FOLLOWING A MEAL ONCE A DAY.   pantoprazole 40 MG tablet Commonly known as:  PROTONIX Take 1 tablet (40 mg total) by mouth daily.   ramipril 5 MG capsule Commonly known as:  ALTACE TAKE 1 CAPSULE DAILY.      Follow-up Information    Rosalin Hawking, MD. Schedule an appointment as soon as possible for a visit in 6 week(s).   Specialty:  Neurology Contact information: [address] [place] [id number] [phone]          No Known Allergies   Procedures/Studies: 2 D ECHO Study Conclusions  - Left ventricle: The cavity size was normal. Systolic function was   normal. The estimated ejection fraction was in the range of 60%   to 65%. Wall motion was normal; there were no  regional wall   motion abnormalities. Doppler parameters are consistent with   abnormal left ventricular relaxation (grade 1 diastolic   dysfunction). Doppler parameters are consistent with   indeterminate ventricular filling pressure. - Aortic valve: Transvalvular velocity was within the normal range.   There was no stenosis. There was no regurgitation. - Mitral valve: Transvalvular velocity was within the normal range.   There was no evidence for stenosis. There was no regurgitation. - Right ventricle: The cavity size was normal. Wall thickness was   normal. Systolic function was normal. - Atrial septum: No defect or patent foramen ovale was identified. - Tricuspid valve: There was no regurgitation.  Ct Angio Head W Or Wo Contrast  Result  Date: 03/05/2017 CLINICAL DATA:  Progressive unsteady gait. Punctate acute left parietal white matter infarct on MRI. EXAM: CT ANGIOGRAPHY HEAD AND NECK TECHNIQUE: Multidetector CT imaging of the head and neck was performed using the standard protocol during bolus administration of intravenous contrast. Multiplanar CT image reconstructions and MIPs were obtained to evaluate the vascular anatomy. Carotid stenosis measurements (when applicable) are obtained utilizing NASCET criteria, using the distal internal carotid diameter as the denominator. CONTRAST:  50 mL Isovue 370 COMPARISON:  Brain MRI 03/04/2017 FINDINGS: CTA NECK FINDINGS Aortic arch: Normal variant aortic arch branching pattern with common origin of the brachiocephalic and left common carotid arteries. Widely patent arch vessel origins. Right carotid system: Patent without evidence of stenosis or dissection. Minimal scattered intimal thickening and atherosclerotic plaque in the common and internal carotid arteries. Left carotid system: Patent without evidence of stenosis or dissection. Mild predominantly calcified plaque in the proximal ICA. Vertebral arteries: Patent with the right being strongly dominant. No evidence of focal stenosis or definite dissection. Skeleton: Cervical spondylosis, evaluated in detail on yesterday's MRI. Other neck: No mass or lymph node enlargement. Upper chest: Prior CABG.  Asymmetric scarring in the left lung apex. Review of the MIP images confirms the above findings CTA HEAD FINDINGS Anterior circulation: The internal carotid arteries are patent from skullbase to carotid termini with mild siphon atherosclerosis bilaterally. Mild supraclinoid ICA stenosis is greater on the right. ACAs and MCAs are patent without evidence of proximal branch occlusion or significant stenosis. No aneurysm. Posterior circulation: The intracranial right vertebral artery is patent and supplies the basilar. Mild right V4 segment atherosclerosis does  not result in significant stenosis. The left vertebral artery is hypoplastic and ends in PICA. Patent right PICA and bilateral SCA origins are also identified. The basilar artery is widely patent. There is a small left posterior communicating artery. PCAs are patent without evidence of significant stenosis. No aneurysm. Venous sinuses: Patent. 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 Delayed phase: No abnormal enhancement. Review of the MIP images confirms the above findings IMPRESSION: 1. Patent circle of Willis with mild intracranial atherosclerosis but no flow limiting proximal stenosis or proximal branch occlusion. 2. 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 3. Minimal cervical carotid artery atherosclerosis without stenosis. Electronically Signed   By: Logan Bores M.D.   On: 03/05/2017 10:54   Ct Head Wo Contrast  Result Date: 03/04/2017 CLINICAL DATA:  71 year old male with altered level of consciousness and vertigo for several weeks. EXAM: CT HEAD WITHOUT CONTRAST TECHNIQUE: Contiguous axial images were obtained from the base of the skull through the vertex without intravenous contrast. COMPARISON:  02/28/2017 MR FINDINGS: Brain: No evidence of acute infarction, hemorrhage, hydrocephalus, extra-axial collection or mass lesion/mass effect. Moderate chronic small-vessel white matter ischemic changes  are again noted. Vascular: Atherosclerotic calcifications noted. Skull: Normal. Negative for fracture or focal lesion. Sinuses/Orbits: No acute finding. Other: None IMPRESSION: 1. No evidence of acute intracranial abnormality 2. Moderate chronic small-vessel white matter ischemic changes. Electronically Signed   By: Margarette Canada M.D.   On: 03/04/2017 19:38   Ct Angio Neck W Or Wo Contrast  Result Date: 03/05/2017 CLINICAL DATA:  Progressive unsteady gait. Punctate acute left parietal white matter infarct on MRI. EXAM: CT ANGIOGRAPHY HEAD AND NECK TECHNIQUE: Multidetector  CT imaging of the head and neck was performed using the standard protocol during bolus administration of intravenous contrast. Multiplanar CT image reconstructions and MIPs were obtained to evaluate the vascular anatomy. Carotid stenosis measurements (when applicable) are obtained utilizing NASCET criteria, using the distal internal carotid diameter as the denominator. CONTRAST:  50 mL Isovue 370 COMPARISON:  Brain MRI 03/04/2017 FINDINGS: CTA NECK FINDINGS Aortic arch: Normal variant aortic arch branching pattern with common origin of the brachiocephalic and left common carotid arteries. Widely patent arch vessel origins. Right carotid system: Patent without evidence of stenosis or dissection. Minimal scattered intimal thickening and atherosclerotic plaque in the common and internal carotid arteries. Left carotid system: Patent without evidence of stenosis or dissection. Mild predominantly calcified plaque in the proximal ICA. Vertebral arteries: Patent with the right being strongly dominant. No evidence of focal stenosis or definite dissection. Skeleton: Cervical spondylosis, evaluated in detail on yesterday's MRI. Other neck: No mass or lymph node enlargement. Upper chest: Prior CABG.  Asymmetric scarring in the left lung apex. Review of the MIP images confirms the above findings CTA HEAD FINDINGS Anterior circulation: The internal carotid arteries are patent from skullbase to carotid termini with mild siphon atherosclerosis bilaterally. Mild supraclinoid ICA stenosis is greater on the right. ACAs and MCAs are patent without evidence of proximal branch occlusion or significant stenosis. No aneurysm. Posterior circulation: The intracranial right vertebral artery is patent and supplies the basilar. Mild right V4 segment atherosclerosis does not result in significant stenosis. The left vertebral artery is hypoplastic and ends in PICA. Patent right PICA and bilateral SCA origins are also identified. The basilar  artery is widely patent. There is a small left posterior communicating artery. PCAs are patent without evidence of significant stenosis. No aneurysm. Venous sinuses: Patent. 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 Delayed phase: No abnormal enhancement. Review of the MIP images confirms the above findings IMPRESSION: 1. Patent circle of Willis with mild intracranial atherosclerosis but no flow limiting proximal stenosis or proximal branch occlusion. 2. 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 3. Minimal cervical carotid artery atherosclerosis without stenosis. Electronically Signed   By: Logan Bores M.D.   On: 03/05/2017 10:54   Mr Brain Wo Contrast  Result Date: 03/05/2017 CLINICAL DATA:  Initial evaluation for slowly progressive ataxia 4 months. EXAM: MRI HEAD WITHOUT CONTRAST MRI CERVICAL SPINE WITHOUT CONTRAST TECHNIQUE: Multiplanar, multiecho pulse sequences of the brain and surrounding structures, and cervical spine, to include the craniocervical junction and cervicothoracic junction, were obtained without intravenous contrast. COMPARISON:  Prior CT from earlier the same day as well as recent MRI from 02/28/2017. FINDINGS: MRI HEAD FINDINGS Brain: Generalized age related cerebral atrophy. Patchy and confluent T2/FLAIR hyperintensity involving the periventricular deep white matter both cerebral hemispheres, most consistent with chronic small vessel ischemic disease, moderate nature. Punctate 4 mm focus of diffusion abnormality seen involving the subcortical white matter of the left parietal lobe (series 3, image 31). Corresponding signal loss seen  on ADC map (series 350, image 31). This is also seen on coronal DWI sequence (series 5, image 8). Finding is new relative to most recent MRI, and suspicious for a tiny acute small vessel type infarct. No associated hemorrhage. No other evidence for acute or subacute ischemia. Gray-white matter differentiation otherwise  maintained. No other evidence for chronic infarction. No acute intracranial hemorrhage. Multiple subcentimeter foci of susceptibility artifact seen throughout the brain, primarily involving the cerebellum and deep gray nuclei, most consistent with small chronic micro hemorrhages. Findings are suspected to be related to underlying chronic hypertension. No mass lesion, midline shift or mass effect. No hydrocephalus. No extra-axial fluid collection. Major dural sinuses are grossly patent. Pituitary gland suprasellar region within normal limits. Midline structures intact and normal. Vascular: Major intracranial vascular flow voids are maintained. Skull and upper cervical spine: Craniocervical junction normal. Bone marrow signal intensity within normal limits. No scalp soft tissue abnormality. Sinuses/Orbits: Globes and orbital soft tissues within normal limits. Mild scattered mucosal thickening within the ethmoidal air cells and right maxillary sinus. Right maxillary sinus retention cyst partially visualized. Paranasal sinuses are otherwise clear. No mastoid effusion. Inner ear structures normal. Other: None. MRI CERVICAL SPINE FINDINGS Alignment: Mild straightening of the normal cervical lordosis. Trace retrolisthesis of C5 on C6. Vertebrae: Vertebral body heights maintained without evidence for acute or chronic fracture. Bone marrow signal intensity within normal limits. No discrete or worrisome osseous lesions. The mild reactive edema about the left C2-3 facet due to facet arthritis (series 4, image 15). Cord: Signal intensity within the cervical spinal cord is normal. Posterior Fossa, vertebral arteries, paraspinal tissues: Craniocervical junction normal. Paraspinous and prevertebral soft tissues normal. Normal intravascular flow voids present within the vertebral arteries bilaterally. Disc levels: C2-C3: No significant disc bulge. Advanced left-sided facet degeneration with associated reactive edema. Asymmetric  perfusion within the left C2-3 facet. Resultant mild left C3 foraminal stenosis. No significant canal or right foraminal encroachment. C3-C4: Mild diffuse disc bulge. Right-sided uncovertebral hypertrophy. No significant canal stenosis. Moderate right C4 foraminal narrowing. No significant left foraminal encroachment. C4-C5: Mild diffuse disc bulge. Bilateral uncovertebral hypertrophy, right greater than left. Bulging disc mildly flattens the ventral thecal sac without significant spinal stenosis. Moderate right with mild left C5 foraminal narrowing. C5-C6: Diffuse circumferential disc osteophyte complex. Bilateral uncovertebral spurring, left greater than right. Broad posterior component flattens and partially faces the ventral degenerative results in mild spinal stenosis. Superimposed mild ligamentum flavum thickening. No significant cord deformity. Severe left with moderate right C6 foraminal narrowing. C6-C7: Diffuse disc bulge with bilateral uncovertebral hypertrophy. Bulging disc mildly indents the ventral thecal sac with resultant fairly mild spinal stenosis. Mild to moderate bilateral C7 foraminal stenosis, slightly worse on the left. C7-T1: Mild bilateral facet hypertrophy, slightly worse on the right. No significant canal or foraminal stenosis. Visualized upper thoracic spine within normal limits. IMPRESSION: MRI HEAD IMPRESSION: 1. Punctate 4 mm acute ischemic nonhemorrhagic small vessel type infarct involving the subcortical white matter of the left parietal lobe. 2. No other acute intracranial abnormality. 3. Moderate chronic small vessel ischemic disease, stable. 4. Multiple scattered chronic micro hemorrhages throughout the brain, similar to previous, again favored to reflect the sequelae of chronic hypertension. MRI CERVICAL SPINE IMPRESSION: 1. No acute abnormality within the cervical spine. 2. 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 3. Multifactorial  degenerative changes with resultant multilevel foraminal narrowing as above. Notable findings include moderate right C4, C5, and C6 foraminal stenosis, severe left C6  foraminal narrowing, with mild to moderate bilateral C7 foraminal stenosis. 4. Reactive edema about the left C2-3 facet due to facet arthritis. Finding could serve as a source for neck pain. Electronically Signed   By: Jeannine Boga M.D.   On: 03/05/2017 00:20   Mr Cervical Spine Wo Contrast  Result Date: 03/05/2017 CLINICAL DATA:  Initial evaluation for slowly progressive ataxia 4 months. EXAM: MRI HEAD WITHOUT CONTRAST MRI CERVICAL SPINE WITHOUT CONTRAST TECHNIQUE: Multiplanar, multiecho pulse sequences of the brain and surrounding structures, and cervical spine, to include the craniocervical junction and cervicothoracic junction, were obtained without intravenous contrast. COMPARISON:  Prior CT from earlier the same day as well as recent MRI from 02/28/2017. FINDINGS: MRI HEAD FINDINGS Brain: Generalized age related cerebral atrophy. Patchy and confluent T2/FLAIR hyperintensity involving the periventricular deep white matter both cerebral hemispheres, most consistent with chronic small vessel ischemic disease, moderate nature. Punctate 4 mm focus of diffusion abnormality seen involving the subcortical white matter of the left parietal lobe (series 3, image 31). Corresponding signal loss seen on ADC map (series 350, image 31). This is also seen on coronal DWI sequence (series 5, image 8). Finding is new relative to most recent MRI, and suspicious for a tiny acute small vessel type infarct. No associated hemorrhage. No other evidence for acute or subacute ischemia. Gray-white matter differentiation otherwise maintained. No other evidence for chronic infarction. No acute intracranial hemorrhage. Multiple subcentimeter foci of susceptibility artifact seen throughout the brain, primarily involving the cerebellum and deep gray nuclei, most  consistent with small chronic micro hemorrhages. Findings are suspected to be related to underlying chronic hypertension. No mass lesion, midline shift or mass effect. No hydrocephalus. No extra-axial fluid collection. Major dural sinuses are grossly patent. Pituitary gland suprasellar region within normal limits. Midline structures intact and normal. Vascular: Major intracranial vascular flow voids are maintained. Skull and upper cervical spine: Craniocervical junction normal. Bone marrow signal intensity within normal limits. No scalp soft tissue abnormality. Sinuses/Orbits: Globes and orbital soft tissues within normal limits. Mild scattered mucosal thickening within the ethmoidal air cells and right maxillary sinus. Right maxillary sinus retention cyst partially visualized. Paranasal sinuses are otherwise clear. No mastoid effusion. Inner ear structures normal. Other: None. MRI CERVICAL SPINE FINDINGS Alignment: Mild straightening of the normal cervical lordosis. Trace retrolisthesis of C5 on C6. Vertebrae: Vertebral body heights maintained without evidence for acute or chronic fracture. Bone marrow signal intensity within normal limits. No discrete or worrisome osseous lesions. The mild reactive edema about the left C2-3 facet due to facet arthritis (series 4, image 15). Cord: Signal intensity within the cervical spinal cord is normal. Posterior Fossa, vertebral arteries, paraspinal tissues: Craniocervical junction normal. Paraspinous and prevertebral soft tissues normal. Normal intravascular flow voids present within the vertebral arteries bilaterally. Disc levels: C2-C3: No significant disc bulge. Advanced left-sided facet degeneration with associated reactive edema. Asymmetric perfusion within the left C2-3 facet. Resultant mild left C3 foraminal stenosis. No significant canal or right foraminal encroachment. C3-C4: Mild diffuse disc bulge. Right-sided uncovertebral hypertrophy. No significant canal  stenosis. Moderate right C4 foraminal narrowing. No significant left foraminal encroachment. C4-C5: Mild diffuse disc bulge. Bilateral uncovertebral hypertrophy, right greater than left. Bulging disc mildly flattens the ventral thecal sac without significant spinal stenosis. Moderate right with mild left C5 foraminal narrowing. C5-C6: Diffuse circumferential disc osteophyte complex. Bilateral uncovertebral spurring, left greater than right. Broad posterior component flattens and partially faces the ventral degenerative results in mild spinal stenosis. Superimposed mild ligamentum flavum thickening.  No significant cord deformity. Severe left with moderate right C6 foraminal narrowing. C6-C7: Diffuse disc bulge with bilateral uncovertebral hypertrophy. Bulging disc mildly indents the ventral thecal sac with resultant fairly mild spinal stenosis. Mild to moderate bilateral C7 foraminal stenosis, slightly worse on the left. C7-T1: Mild bilateral facet hypertrophy, slightly worse on the right. No significant canal or foraminal stenosis. Visualized upper thoracic spine within normal limits. IMPRESSION: MRI HEAD IMPRESSION: 1. Punctate 4 mm acute ischemic nonhemorrhagic small vessel type infarct involving the subcortical white matter of the left parietal lobe. 2. No other acute intracranial abnormality. 3. Moderate chronic small vessel ischemic disease, stable. 4. Multiple scattered chronic micro hemorrhages throughout the brain, similar to previous, again favored to reflect the sequelae of chronic hypertension. MRI CERVICAL SPINE IMPRESSION: 1. No acute abnormality within the cervical spine. 2. 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 3. Multifactorial degenerative changes with resultant multilevel foraminal narrowing as above. Notable findings include moderate right C4, C5, and C6 foraminal stenosis, severe left C6 foraminal narrowing, with mild to moderate bilateral C7 foraminal  stenosis. 4. Reactive edema about the left C2-3 facet due to facet arthritis. Finding could serve as a source for neck pain. Electronically Signed   By: Jeannine Boga M.D.   On: 03/05/2017 00:20       Discharge Exam: Vitals:   03/05/17 1200 03/05/17 1512  BP: (!) 190/74 (!) 147/58  Pulse: (!) 56 (!) 57  Resp: 18 18  Temp: 97.6 F (36.4 C)   SpO2:  96%   Vitals:   03/05/17 0800 03/05/17 1000 03/05/17 1200 03/05/17 1512  BP: (!) 172/80 (!) 180/78 (!) 190/74 (!) 147/58  Pulse:   (!) 56 (!) 57  Resp: 18 18 18 18   Temp: 98.2 F (36.8 C) 98 F (36.7 C) 97.6 F (36.4 C)   TempSrc: Oral Oral Oral Oral  SpO2:    96%  Weight:      Height:        General: Pt is alert, awake, not in acute distress Cardiovascular: RRR, S1/S2 +, no rubs, no gallops Respiratory: CTA bilaterally, no wheezing, no rhonchi Abdominal: Soft, NT, ND, bowel sounds + Extremities: no edema, no cyanosis    The results of significant diagnostics from this hospitalization (including imaging, microbiology, ancillary and laboratory) are listed below for reference.     Microbiology: No results found for this or any previous visit (from the past 240 hour(s)).   Labs: BNP (last 3 results) No results for input(s): BNP in the last 8760 hours. Basic Metabolic Panel: Recent Labs  Lab 03/04/17 1823 03/04/17 1831  NA 137 141  K 3.9 3.9  CL 105 101  CO2 23  --   GLUCOSE 184* 188*  BUN 11 12  CREATININE 1.04 0.90  CALCIUM 8.5*  --   MG 1.4*  --    Liver Function Tests: Recent Labs  Lab 03/04/17 1823  AST 25  ALT 20  ALKPHOS 58  BILITOT 0.9  PROT 6.2*  ALBUMIN 3.7   No results for input(s): LIPASE, AMYLASE in the last 168 hours. No results for input(s): AMMONIA in the last 168 hours. CBC: Recent Labs  Lab 03/04/17 1823 03/04/17 1831  WBC 6.4  --   NEUTROABS 4.1  --   HGB 13.0 12.9*  HCT 38.6* 38.0*  MCV 93.5  --   PLT 223  --    Cardiac Enzymes: Recent Labs  Lab 03/05/17 0125   CKTOTAL 77  BNP: Invalid input(s): POCBNP CBG: Recent Labs  Lab 03/05/17 1234  GLUCAP 135*   D-Dimer No results for input(s): DDIMER in the last 72 hours. Hgb A1c No results for input(s): HGBA1C in the last 72 hours. Lipid Profile Recent Labs    03/05/17 0503  CHOL 122  HDL 41  LDLCALC 57  TRIG 122  CHOLHDL 3.0   Thyroid function studies Recent Labs    03/04/17 1823  TSH 2.254   Anemia work up No results for input(s): VITAMINB12, FOLATE, FERRITIN, TIBC, IRON, RETICCTPCT in the last 72 hours. Urinalysis    Component Value Date/Time   COLORURINE STRAW (A) 03/04/2017 1823   APPEARANCEUR CLEAR 03/04/2017 1823   LABSPEC 1.004 (L) 03/04/2017 1823   PHURINE 6.0 03/04/2017 1823   GLUCOSEU NEGATIVE 03/04/2017 1823   HGBUR NEGATIVE 03/04/2017 1823   BILIRUBINUR NEGATIVE 03/04/2017 1823   KETONESUR NEGATIVE 03/04/2017 1823   PROTEINUR NEGATIVE 03/04/2017 1823   NITRITE NEGATIVE 03/04/2017 1823   LEUKOCYTESUR NEGATIVE 03/04/2017 1823   Sepsis Labs Invalid input(s): PROCALCITONIN,  WBC,  LACTICIDVEN Microbiology No results found for this or any previous visit (from the past 240 hour(s)).   Time coordinating discharge: Over 30 minutes  SIGNED:   Debbe Odea, MD  [facility] 03/05/2017, 5:14 PM Pager   If 7PM-7AM, please contact night-coverage [email or web address] Password [email or web address]

## 2017-03-05 NOTE — Care Management Note (Signed)
Case Management Note  Patient Details  Name: Joel Gonzales. MRN: [id number] Date of Birth: 1946/02/26  Subjective/Objective:                    Action/Plan: Patient discharging home with self care. CM consulted for Outpatient therapy. Pt would like to attend in [facility]. Orders in [place]. Will fax to [facility] outpatient in am. Pt has transportation home.  Expected Discharge Date:  03/05/17               Expected Discharge Plan:  OP Rehab  In-House Referral:     Discharge planning Services  CM Consult  Post Acute Care Choice:    Choice offered to:     DME Arranged:    DME Agency:     HH Arranged:    HH Agency:     Status of Service:  Completed, signed off  If discussed at [employer] of Stay Meetings, dates discussed:    Additional Comments:  Pollie Friar, RN 03/05/2017, 5:58 PM

## 2017-03-05 NOTE — Progress Notes (Signed)
  Echocardiogram 2D Echocardiogram has been performed.  Joel Gonzales 03/05/2017, 12:31 PM

## 2017-03-05 NOTE — H&P (Signed)
History and Physical    Joel Gonzales. UVO:[id number] DOB: 1945/04/13 DOA: 03/04/2017  PCP: Myer Peer, MD  Patient coming from: Home  I have personally briefly reviewed patient's old medical records in [place]  Chief Complaint: Unsteady gait  HPI: Joel Gonzales. is a 71 y.o. male with medical history significant of progressively unsteady gait since last summer.  Gait is shuffling, loss of balance when he bends over.  Today had especially bad episode of loss of coordination in both arms and legs.   ED Course: MRI brain today shows punctate acute ischemic stroke.   Review of Systems: As per HPI otherwise 10 point review of systems negative.   Past Medical History:  Diagnosis Date  . CAD (coronary artery disease)   . Diabetes mellitus without complication (HCC)    Type 2  . Family history of ASCVD   . History of stress test 01/2012   which was entirely normal  . Hx of CABG    x6 LIma to his LAD, a RIMA to his PDA, right radial to obtuse marginal branch, diagnonal, second marginal, & posteolateral branches  . Hyperlipemia   . Hypertension   . RBBB    chronic    Past Surgical History:  Procedure Laterality Date  . CHOLECYSTECTOMY  2002  . CORONARY ARTERY BYPASS GRAFT     x6 LIma to his LAD, a RIMA to his PDA, right radial to obtuse marginal branch, diagnonal, second marginal, & posteolateral branches  . NOSE SURGERY  02/2013   carcinoma  . TONSILLECTOMY     as a child     reports that  has never smoked. he has never used smokeless tobacco. He reports that he drinks alcohol. He reports that he does not use drugs.  No Known Allergies  Family History  Problem Relation Age of Onset  . Heart attack Father 103       deceased, ASCVD  . Hypertension Father   . Heart disease Father   . Heart Problems Mother        ASCVD  . Hypertension Mother   . Heart attack Sister   . Hypertension Brother      Prior to Admission medications   Medication Sig  Start Date End Date Taking? Authorizing Provider  aspirin 81 MG tablet Take 81 mg by mouth daily.   Yes [provider]  atorvastatin (LIPITOR) 40 MG tablet Take 1 tablet (40 mg total) by mouth daily. 02/23/15  Yes Lorretta Harp, MD  metFORMIN (GLUCOPHAGE) 1000 MG tablet Take 1,000 mg by mouth 2 (two) times daily with a meal.   Yes [provider]  metoprolol succinate (TOPROL-XL) 50 MG 24 hr tablet TAKE 1 TABLET WITH OR IMMEDIATELY FOLLOWING A MEAL ONCE A DAY. 11/01/16  Yes Lorretta Harp, MD  pantoprazole (PROTONIX) 40 MG tablet Take 1 tablet (40 mg total) by mouth daily. 02/23/15  Yes Lorretta Harp, MD  ramipril (ALTACE) 5 MG capsule TAKE 1 CAPSULE DAILY. 01/11/16  Yes Lorretta Harp, MD    Physical Exam: Vitals:   03/04/17 2230 03/05/17 0017 03/05/17 0030 03/05/17 0100  BP: (!) 157/81 (!) 179/95 (!) 179/80 (!) 158/74  Pulse: 63 (!) 57 61 (!) 59  Resp: (!) 23  20 20   Temp:      TempSrc:      SpO2: 92% 97% 98% 97%  Weight:      Height:        Constitutional:  NAD, calm, comfortable Eyes: PERRL, lids and conjunctivae normal ENMT: Mucous membranes are moist. Posterior pharynx clear of any exudate or lesions.Normal dentition.  Neck: normal, supple, no masses, no thyromegaly Respiratory: clear to auscultation bilaterally, no wheezing, no crackles. Normal respiratory effort. No accessory muscle use.  Cardiovascular: Regular rate and rhythm, no murmurs / rubs / gallops. No extremity edema. 2+ pedal pulses. No carotid bruits.  Abdomen: no tenderness, no masses palpated. No hepatosplenomegaly. Bowel sounds positive.  Musculoskeletal: no clubbing / cyanosis. No joint deformity upper and lower extremities. Good ROM, no contractures. Normal muscle tone.  Skin: no rashes, lesions, ulcers. No induration Neurologic: CN 2-12 grossly intact. Sensation intact, DTR normal. Strength 5/5 in all 4.  Psychiatric: Normal judgment and insight. Alert and oriented x 3. Normal  mood.    Labs on Admission: I have personally reviewed following labs and imaging studies  CBC: Recent Labs  Lab 03/04/17 1823 03/04/17 1831  WBC 6.4  --   NEUTROABS 4.1  --   HGB 13.0 12.9*  HCT 38.6* 38.0*  MCV 93.5  --   PLT 223  --    Basic Metabolic Panel: Recent Labs  Lab 03/04/17 1823 03/04/17 1831  NA 137 141  K 3.9 3.9  CL 105 101  CO2 23  --   GLUCOSE 184* 188*  BUN 11 12  CREATININE 1.04 0.90  CALCIUM 8.5*  --   MG 1.4*  --    GFR: Estimated Creatinine Clearance: 81.4 mL/min (by C-G formula based on SCr of 0.9 mg/dL). Liver Function Tests: Recent Labs  Lab 03/04/17 1823  AST 25  ALT 20  ALKPHOS 58  BILITOT 0.9  PROT 6.2*  ALBUMIN 3.7   No results for input(s): LIPASE, AMYLASE in the last 168 hours. No results for input(s): AMMONIA in the last 168 hours. Coagulation Profile: Recent Labs  Lab 03/04/17 1823  INR 0.99   Cardiac Enzymes: Recent Labs  Lab 03/05/17 0125  CKTOTAL 77   BNP (last 3 results) No results for input(s): PROBNP in the last 8760 hours. HbA1C: No results for input(s): HGBA1C in the last 72 hours. CBG: No results for input(s): GLUCAP in the last 168 hours. Lipid Profile: No results for input(s): CHOL, HDL, LDLCALC, TRIG, CHOLHDL, LDLDIRECT in the last 72 hours. Thyroid Function Tests: Recent Labs    03/04/17 1823  TSH 2.254   Anemia Panel: No results for input(s): VITAMINB12, FOLATE, FERRITIN, TIBC, IRON, RETICCTPCT in the last 72 hours. Urine analysis: No results found for: COLORURINE, APPEARANCEUR, LABSPEC, PHURINE, GLUCOSEU, HGBUR, BILIRUBINUR, KETONESUR, PROTEINUR, UROBILINOGEN, NITRITE, LEUKOCYTESUR  Radiological Exams on Admission: Ct Head Wo Contrast  Result Date: 03/04/2017 CLINICAL DATA:  71 year old male with altered level of consciousness and vertigo for several weeks. EXAM: CT HEAD WITHOUT CONTRAST TECHNIQUE: Contiguous axial images were obtained from the base of the skull through the vertex without  intravenous contrast. COMPARISON:  02/28/2017 MR FINDINGS: Brain: No evidence of acute infarction, hemorrhage, hydrocephalus, extra-axial collection or mass lesion/mass effect. Moderate chronic small-vessel white matter ischemic changes are again noted. Vascular: Atherosclerotic calcifications noted. Skull: Normal. Negative for fracture or focal lesion. Sinuses/Orbits: No acute finding. Other: None IMPRESSION: 1. No evidence of acute intracranial abnormality 2. Moderate chronic small-vessel white matter ischemic changes. Electronically Signed   By: Margarette Canada M.D.   On: 03/04/2017 19:38   Mr Brain Wo Contrast  Result Date: 03/05/2017 CLINICAL DATA:  Initial evaluation for slowly progressive ataxia 4 months. EXAM: MRI HEAD WITHOUT CONTRAST MRI  CERVICAL SPINE WITHOUT CONTRAST TECHNIQUE: Multiplanar, multiecho pulse sequences of the brain and surrounding structures, and cervical spine, to include the craniocervical junction and cervicothoracic junction, were obtained without intravenous contrast. COMPARISON:  Prior CT from earlier the same day as well as recent MRI from 02/28/2017. FINDINGS: MRI HEAD FINDINGS Brain: Generalized age related cerebral atrophy. Patchy and confluent T2/FLAIR hyperintensity involving the periventricular deep white matter both cerebral hemispheres, most consistent with chronic small vessel ischemic disease, moderate nature. Punctate 4 mm focus of diffusion abnormality seen involving the subcortical white matter of the left parietal lobe (series 3, image 31). Corresponding signal loss seen on ADC map (series 350, image 31). This is also seen on coronal DWI sequence (series 5, image 8). Finding is new relative to most recent MRI, and suspicious for a tiny acute small vessel type infarct. No associated hemorrhage. No other evidence for acute or subacute ischemia. Gray-white matter differentiation otherwise maintained. No other evidence for chronic infarction. No acute intracranial  hemorrhage. Multiple subcentimeter foci of susceptibility artifact seen throughout the brain, primarily involving the cerebellum and deep gray nuclei, most consistent with small chronic micro hemorrhages. Findings are suspected to be related to underlying chronic hypertension. No mass lesion, midline shift or mass effect. No hydrocephalus. No extra-axial fluid collection. Major dural sinuses are grossly patent. Pituitary gland suprasellar region within normal limits. Midline structures intact and normal. Vascular: Major intracranial vascular flow voids are maintained. Skull and upper cervical spine: Craniocervical junction normal. Bone marrow signal intensity within normal limits. No scalp soft tissue abnormality. Sinuses/Orbits: Globes and orbital soft tissues within normal limits. Mild scattered mucosal thickening within the ethmoidal air cells and right maxillary sinus. Right maxillary sinus retention cyst partially visualized. Paranasal sinuses are otherwise clear. No mastoid effusion. Inner ear structures normal. Other: None. MRI CERVICAL SPINE FINDINGS Alignment: Mild straightening of the normal cervical lordosis. Trace retrolisthesis of C5 on C6. Vertebrae: Vertebral body heights maintained without evidence for acute or chronic fracture. Bone marrow signal intensity within normal limits. No discrete or worrisome osseous lesions. The mild reactive edema about the left C2-3 facet due to facet arthritis (series 4, image 15). Cord: Signal intensity within the cervical spinal cord is normal. Posterior Fossa, vertebral arteries, paraspinal tissues: Craniocervical junction normal. Paraspinous and prevertebral soft tissues normal. Normal intravascular flow voids present within the vertebral arteries bilaterally. Disc levels: C2-C3: No significant disc bulge. Advanced left-sided facet degeneration with associated reactive edema. Asymmetric perfusion within the left C2-3 facet. Resultant mild left C3 foraminal  stenosis. No significant canal or right foraminal encroachment. C3-C4: Mild diffuse disc bulge. Right-sided uncovertebral hypertrophy. No significant canal stenosis. Moderate right C4 foraminal narrowing. No significant left foraminal encroachment. C4-C5: Mild diffuse disc bulge. Bilateral uncovertebral hypertrophy, right greater than left. Bulging disc mildly flattens the ventral thecal sac without significant spinal stenosis. Moderate right with mild left C5 foraminal narrowing. C5-C6: Diffuse circumferential disc osteophyte complex. Bilateral uncovertebral spurring, left greater than right. Broad posterior component flattens and partially faces the ventral degenerative results in mild spinal stenosis. Superimposed mild ligamentum flavum thickening. No significant cord deformity. Severe left with moderate right C6 foraminal narrowing. C6-C7: Diffuse disc bulge with bilateral uncovertebral hypertrophy. Bulging disc mildly indents the ventral thecal sac with resultant fairly mild spinal stenosis. Mild to moderate bilateral C7 foraminal stenosis, slightly worse on the left. C7-T1: Mild bilateral facet hypertrophy, slightly worse on the right. No significant canal or foraminal stenosis. Visualized upper thoracic spine within normal limits. IMPRESSION: MRI HEAD IMPRESSION:  1. Punctate 4 mm acute ischemic nonhemorrhagic small vessel type infarct involving the subcortical white matter of the left parietal lobe. 2. No other acute intracranial abnormality. 3. Moderate chronic small vessel ischemic disease, stable. 4. Multiple scattered chronic micro hemorrhages throughout the brain, similar to previous, again favored to reflect the sequelae of chronic hypertension. MRI CERVICAL SPINE IMPRESSION: 1. No acute abnormality within the cervical spine. 2. 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 3. Multifactorial degenerative changes with resultant multilevel foraminal narrowing as above.  Notable findings include moderate right C4, C5, and C6 foraminal stenosis, severe left C6 foraminal narrowing, with mild to moderate bilateral C7 foraminal stenosis. 4. Reactive edema about the left C2-3 facet due to facet arthritis. Finding could serve as a source for neck pain. Electronically Signed   By: Jeannine Boga M.D.   On: 03/05/2017 00:20   Mr Cervical Spine Wo Contrast  Result Date: 03/05/2017 CLINICAL DATA:  Initial evaluation for slowly progressive ataxia 4 months. EXAM: MRI HEAD WITHOUT CONTRAST MRI CERVICAL SPINE WITHOUT CONTRAST TECHNIQUE: Multiplanar, multiecho pulse sequences of the brain and surrounding structures, and cervical spine, to include the craniocervical junction and cervicothoracic junction, were obtained without intravenous contrast. COMPARISON:  Prior CT from earlier the same day as well as recent MRI from 02/28/2017. FINDINGS: MRI HEAD FINDINGS Brain: Generalized age related cerebral atrophy. Patchy and confluent T2/FLAIR hyperintensity involving the periventricular deep white matter both cerebral hemispheres, most consistent with chronic small vessel ischemic disease, moderate nature. Punctate 4 mm focus of diffusion abnormality seen involving the subcortical white matter of the left parietal lobe (series 3, image 31). Corresponding signal loss seen on ADC map (series 350, image 31). This is also seen on coronal DWI sequence (series 5, image 8). Finding is new relative to most recent MRI, and suspicious for a tiny acute small vessel type infarct. No associated hemorrhage. No other evidence for acute or subacute ischemia. Gray-white matter differentiation otherwise maintained. No other evidence for chronic infarction. No acute intracranial hemorrhage. Multiple subcentimeter foci of susceptibility artifact seen throughout the brain, primarily involving the cerebellum and deep gray nuclei, most consistent with small chronic micro hemorrhages. Findings are suspected to be  related to underlying chronic hypertension. No mass lesion, midline shift or mass effect. No hydrocephalus. No extra-axial fluid collection. Major dural sinuses are grossly patent. Pituitary gland suprasellar region within normal limits. Midline structures intact and normal. Vascular: Major intracranial vascular flow voids are maintained. Skull and upper cervical spine: Craniocervical junction normal. Bone marrow signal intensity within normal limits. No scalp soft tissue abnormality. Sinuses/Orbits: Globes and orbital soft tissues within normal limits. Mild scattered mucosal thickening within the ethmoidal air cells and right maxillary sinus. Right maxillary sinus retention cyst partially visualized. Paranasal sinuses are otherwise clear. No mastoid effusion. Inner ear structures normal. Other: None. MRI CERVICAL SPINE FINDINGS Alignment: Mild straightening of the normal cervical lordosis. Trace retrolisthesis of C5 on C6. Vertebrae: Vertebral body heights maintained without evidence for acute or chronic fracture. Bone marrow signal intensity within normal limits. No discrete or worrisome osseous lesions. The mild reactive edema about the left C2-3 facet due to facet arthritis (series 4, image 15). Cord: Signal intensity within the cervical spinal cord is normal. Posterior Fossa, vertebral arteries, paraspinal tissues: Craniocervical junction normal. Paraspinous and prevertebral soft tissues normal. Normal intravascular flow voids present within the vertebral arteries bilaterally. Disc levels: C2-C3: No significant disc bulge. Advanced left-sided facet degeneration with associated reactive edema. Asymmetric  perfusion within the left C2-3 facet. Resultant mild left C3 foraminal stenosis. No significant canal or right foraminal encroachment. C3-C4: Mild diffuse disc bulge. Right-sided uncovertebral hypertrophy. No significant canal stenosis. Moderate right C4 foraminal narrowing. No significant left foraminal  encroachment. C4-C5: Mild diffuse disc bulge. Bilateral uncovertebral hypertrophy, right greater than left. Bulging disc mildly flattens the ventral thecal sac without significant spinal stenosis. Moderate right with mild left C5 foraminal narrowing. C5-C6: Diffuse circumferential disc osteophyte complex. Bilateral uncovertebral spurring, left greater than right. Broad posterior component flattens and partially faces the ventral degenerative results in mild spinal stenosis. Superimposed mild ligamentum flavum thickening. No significant cord deformity. Severe left with moderate right C6 foraminal narrowing. C6-C7: Diffuse disc bulge with bilateral uncovertebral hypertrophy. Bulging disc mildly indents the ventral thecal sac with resultant fairly mild spinal stenosis. Mild to moderate bilateral C7 foraminal stenosis, slightly worse on the left. C7-T1: Mild bilateral facet hypertrophy, slightly worse on the right. No significant canal or foraminal stenosis. Visualized upper thoracic spine within normal limits. IMPRESSION: MRI HEAD IMPRESSION: 1. Punctate 4 mm acute ischemic nonhemorrhagic small vessel type infarct involving the subcortical white matter of the left parietal lobe. 2. No other acute intracranial abnormality. 3. Moderate chronic small vessel ischemic disease, stable. 4. Multiple scattered chronic micro hemorrhages throughout the brain, similar to previous, again favored to reflect the sequelae of chronic hypertension. MRI CERVICAL SPINE IMPRESSION: 1. No acute abnormality within the cervical spine. 2. 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 3. Multifactorial degenerative changes with resultant multilevel foraminal narrowing as above. Notable findings include moderate right C4, C5, and C6 foraminal stenosis, severe left C6 foraminal narrowing, with mild to moderate bilateral C7 foraminal stenosis. 4. Reactive edema about the left C2-3 facet due to facet arthritis.  Finding could serve as a source for neck pain. Electronically Signed   By: Jeannine Boga M.D.   On: 03/05/2017 00:20    EKG: Independently reviewed.  Assessment/Plan Principal Problem:   Acute ischemic stroke [facility]) Active Problems:   Essential hypertension   Hyperlipidemia with target LDL less than 130   Gait disturbance    1. Acute ischemic stroke - 1. Punctate acute ischemic stroke 2. Stroke pathway 3. 2d echo 4. Carotid duplex 5. A1C, lipid profile 6. Neuro eval 7. ASA 325 (Verifying with Dr. Leonel Ramsay) 8. Tele monitor 2. Chronic gait disorder - 1. Also neuro thinks he has a chronic gait disorder that is not related to today's TIA (with leftover punctate acute stroke). 3. HTN - 1. Allow permissive HTN 2. Holding home BP meds 4. HLD - continue statin  DVT prophylaxis: SCDs Code Status: Full Family Communication: No family in room Disposition Plan: Home after admit Consults called: Neuro Admission status: Admit to inpatient   Etta Quill DO [facility] Pager [phone]  If 7AM-7PM, please contact day team taking care of patient [email or web address] Password [email or web address]  03/05/2017, 2:12 AM

## 2017-03-05 NOTE — ED Notes (Signed)
ED Provider at bedside. 

## 2017-03-05 NOTE — Discharge Instructions (Signed)
Resume Metformin on Wednesday   Please take all your medications with you for your next visit with your Primary MD. Please request your Primary MD to go over all hospital test results at the follow up. Please ask your Primary MD to get all Hospital records sent to his/her office.  If you experience worsening of your admission symptoms, develop shortness of breath, chest pain, suicidal or homicidal thoughts or a life threatening emergency, you must seek medical attention immediately by calling 911 or calling your MD.  Dennis Bast must read the complete instructions/literature along with all the possible adverse reactions/side effects for all the medicines you take including new medications that have been prescribed to you. Take new medicines after you have completely understood and accpet all the possible adverse reactions/side effects.   Do not drive when taking pain medications or sedatives.    Do not take more than prescribed Pain, Sleep and Anxiety Medications  If you have smoked or chewed Tobacco in the last 2 yrs please stop. Stop any regular alcohol and or recreational drug use.  Wear Seat belts while driving.

## 2017-03-05 NOTE — ED Notes (Signed)
Patient back in room from MRI. No distress observed.

## 2017-03-05 NOTE — Evaluation (Signed)
Speech Language Pathology Evaluation Patient Details Name: Jillian Pianka. MRN: [id number] DOB: 1945-07-17 Today's Date: 03/05/2017 Time: 1445-1500 SLP Time Calculation (min) (ACUTE ONLY): 15 min  Problem List:  Patient Active Problem List   Diagnosis Date Noted  . Acute ischemic stroke ([place]) 03/05/2017  . Gait disturbance 03/05/2017  . Unsteadiness   . 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 Coronary artery disease 02/13/2013  . Essential hypertension 02/13/2013  . Hyperlipidemia with target LDL less than 130 02/13/2013  . Right bundle branch block 02/13/2013   Past Medical History:  Past Medical History:  Diagnosis Date  . CAD (coronary artery disease)   . Diabetes mellitus without complication (HCC)    Type 2  . Family history of ASCVD   . History of stress test 01/2012   which was entirely normal  . Hx of CABG    x6 LIma to his LAD, a RIMA to his PDA, right radial to obtuse marginal branch, diagnonal, second marginal, & posteolateral branches  . Hyperlipemia   . Hypertension   . RBBB    chronic   Past Surgical History:  Past Surgical History:  Procedure Laterality Date  . CHOLECYSTECTOMY  2002  . CORONARY ARTERY BYPASS GRAFT     x6 LIma to his LAD, a RIMA to his PDA, right radial to obtuse marginal branch, diagnonal, second marginal, & posteolateral branches  . NOSE SURGERY  02/2013   carcinoma  . TONSILLECTOMY     as a child   HPI:  THIERRY DOBOSZ Jr.is a 28 y.o.malewith medical history significant ofprogressively unsteady gait since last summer. Gait is shuffling, loss of balance when he bends over. MRI brain today shows punctate acute ischemic stroke.    Assessment / Plan / Recommendation Clinical Impression  Pt presents with functional cognitive linguistic abilities and his family is present and doesn't report any language deficits at this time. Pt appears to be back at baseline.     SLP Assessment  SLP Recommendation/Assessment: Patient does not need any  further Speech Lanaguage Pathology Services SLP Visit Diagnosis: Cognitive communication deficit (R41.841)    Follow Up Recommendations  None          SLP Evaluation Cognition  Overall Cognitive Status: Within Functional Limits for tasks assessed       Comprehension  Auditory Comprehension Overall Auditory Comprehension: Appears within functional limits for tasks assessed Visual Recognition/Discrimination Discrimination: Within Function Limits Reading Comprehension Reading Status: Within funtional limits    Expression Expression Primary Mode of Expression: Verbal Verbal Expression Overall Verbal Expression: Appears within functional limits for tasks assessed Written Expression Dominant Hand: Right Written Expression: Not tested   Oral / Motor  Oral Motor/Sensory Function Overall Oral Motor/Sensory Function: Within functional limits Motor Speech Overall Motor Speech: Appears within functional limits for tasks assessed Respiration: Within functional limits   GO                    Gillie Crisci 03/05/2017, 4:12 PM

## 2017-03-06 LAB — HEMOGLOBIN A1C
Hgb A1c MFr Bld: 7 % — ABNORMAL HIGH (ref 4.8–5.6)
Mean Plasma Glucose: 154 mg/dL

## 2017-03-07 DIAGNOSIS — R278 Other lack of coordination: Secondary | ICD-10-CM | POA: Diagnosis not present

## 2017-03-07 DIAGNOSIS — Z8673 Personal history of transient ischemic attack (TIA), and cerebral infarction without residual deficits: Secondary | ICD-10-CM | POA: Diagnosis not present

## 2017-03-07 DIAGNOSIS — R2681 Unsteadiness on feet: Secondary | ICD-10-CM | POA: Diagnosis not present

## 2017-03-07 DIAGNOSIS — R2689 Other abnormalities of gait and mobility: Secondary | ICD-10-CM | POA: Diagnosis not present

## 2017-03-07 DIAGNOSIS — M6281 Muscle weakness (generalized): Secondary | ICD-10-CM | POA: Diagnosis not present

## 2017-03-07 NOTE — Consult Note (Signed)
            [facility] CM Primary Care Navigator  03/07/2017  Joel Gonzales. 1945/08/07 [id number]   Attemptto seepatient at the bedside to identify possible discharge needs but he was alreadydischargedper staff report.  Patient was discharged home with order for outpatient rehab.  Primary care provider's (Dr. Rod Holler) office called to notify of patient's discharge and need for post hospital follow-up and transition of care. Notified of health issues needing follow-up.  Made aware to refer patient to [facility] care management if deemed necessary or appropriate for services.   For additional questions please contact:  Edwena Felty A. Jarian Longoria, BSN, RN-BC [facility] PRIMARY CARE Navigator Cell: [phone]

## 2017-03-09 DIAGNOSIS — Z8673 Personal history of transient ischemic attack (TIA), and cerebral infarction without residual deficits: Secondary | ICD-10-CM | POA: Diagnosis not present

## 2017-03-09 DIAGNOSIS — M6281 Muscle weakness (generalized): Secondary | ICD-10-CM | POA: Diagnosis not present

## 2017-03-09 DIAGNOSIS — R2681 Unsteadiness on feet: Secondary | ICD-10-CM | POA: Diagnosis not present

## 2017-03-09 DIAGNOSIS — R278 Other lack of coordination: Secondary | ICD-10-CM | POA: Diagnosis not present

## 2017-03-09 DIAGNOSIS — R2689 Other abnormalities of gait and mobility: Secondary | ICD-10-CM | POA: Diagnosis not present

## 2017-03-14 ENCOUNTER — Ambulatory Visit: Payer: Medicare Other | Admitting: Physical Therapy

## 2017-03-14 DIAGNOSIS — M6281 Muscle weakness (generalized): Secondary | ICD-10-CM | POA: Diagnosis not present

## 2017-03-14 DIAGNOSIS — R278 Other lack of coordination: Secondary | ICD-10-CM | POA: Diagnosis not present

## 2017-03-14 DIAGNOSIS — R2689 Other abnormalities of gait and mobility: Secondary | ICD-10-CM | POA: Diagnosis not present

## 2017-03-14 DIAGNOSIS — Z8673 Personal history of transient ischemic attack (TIA), and cerebral infarction without residual deficits: Secondary | ICD-10-CM | POA: Diagnosis not present

## 2017-03-14 DIAGNOSIS — R2681 Unsteadiness on feet: Secondary | ICD-10-CM | POA: Diagnosis not present

## 2017-03-15 DIAGNOSIS — R269 Unspecified abnormalities of gait and mobility: Secondary | ICD-10-CM | POA: Diagnosis not present

## 2017-03-15 DIAGNOSIS — I639 Cerebral infarction, unspecified: Secondary | ICD-10-CM | POA: Diagnosis not present

## 2017-03-16 DIAGNOSIS — Z8673 Personal history of transient ischemic attack (TIA), and cerebral infarction without residual deficits: Secondary | ICD-10-CM | POA: Diagnosis not present

## 2017-03-16 DIAGNOSIS — R2681 Unsteadiness on feet: Secondary | ICD-10-CM | POA: Diagnosis not present

## 2017-03-16 DIAGNOSIS — M6281 Muscle weakness (generalized): Secondary | ICD-10-CM | POA: Diagnosis not present

## 2017-03-16 DIAGNOSIS — R2689 Other abnormalities of gait and mobility: Secondary | ICD-10-CM | POA: Diagnosis not present

## 2017-03-16 DIAGNOSIS — R278 Other lack of coordination: Secondary | ICD-10-CM | POA: Diagnosis not present

## 2017-03-20 DIAGNOSIS — Z8673 Personal history of transient ischemic attack (TIA), and cerebral infarction without residual deficits: Secondary | ICD-10-CM | POA: Diagnosis not present

## 2017-03-20 DIAGNOSIS — R2689 Other abnormalities of gait and mobility: Secondary | ICD-10-CM | POA: Diagnosis not present

## 2017-03-20 DIAGNOSIS — R2681 Unsteadiness on feet: Secondary | ICD-10-CM | POA: Diagnosis not present

## 2017-03-20 DIAGNOSIS — M6281 Muscle weakness (generalized): Secondary | ICD-10-CM | POA: Diagnosis not present

## 2017-03-20 DIAGNOSIS — R278 Other lack of coordination: Secondary | ICD-10-CM | POA: Diagnosis not present

## 2017-03-21 ENCOUNTER — Ambulatory Visit (INDEPENDENT_AMBULATORY_CARE_PROVIDER_SITE_OTHER): Payer: Medicare Other

## 2017-03-21 ENCOUNTER — Encounter: Payer: Self-pay | Admitting: Cardiovascular Disease

## 2017-03-21 ENCOUNTER — Ambulatory Visit (INDEPENDENT_AMBULATORY_CARE_PROVIDER_SITE_OTHER): Payer: Medicare Other | Admitting: Cardiovascular Disease

## 2017-03-21 ENCOUNTER — Other Ambulatory Visit: Payer: Self-pay | Admitting: Cardiology

## 2017-03-21 DIAGNOSIS — E785 Hyperlipidemia, unspecified: Secondary | ICD-10-CM | POA: Diagnosis not present

## 2017-03-21 DIAGNOSIS — I10 Essential (primary) hypertension: Secondary | ICD-10-CM | POA: Diagnosis not present

## 2017-03-21 DIAGNOSIS — I4891 Unspecified atrial fibrillation: Secondary | ICD-10-CM

## 2017-03-21 DIAGNOSIS — I639 Cerebral infarction, unspecified: Secondary | ICD-10-CM | POA: Diagnosis not present

## 2017-03-21 DIAGNOSIS — I2510 Atherosclerotic heart disease of native coronary artery without angina pectoris: Secondary | ICD-10-CM

## 2017-03-21 NOTE — Patient Instructions (Signed)

## 2017-03-21 NOTE — Progress Notes (Signed)
03/21/2017 Joel Gonzales   Dec 01, 1945  [id number]  Primary Physician Nicki Reaper Gwenyth Bouillon, MD Primary Cardiologist: Lorretta Harp MD FACP, [place], [place], [place]  HPI:  Joel Gonzales. is a 71 y.o.  mildly overweight Caucasian male, father of 1 child, who I last saw in the office 02/23/15. He has a history of CAD status post coronary artery bypass surgery x6 in February 2001. He had a LIMA to his LAD, a RIMA to his PDA, right radial to the first obtuse marginal branch, diagonal branch, second marginal branch, and posterolateral branches. His other problems include treated hypertension, dyslipidemia, and chronic right bundle branch block. He denies chest pain or shortness of breath. Dr. Nicki Reaper has checked his lipid profile in the past. He had a Myoview stress test performed October 31, which was entirely normal. Since I saw him year ago he's been completely asymptomatic. He had a Myoview stress test performed in the office 05/15/14 which is entirely normal as was a 2-D echocardiogram. He was admitted to the hospital with stroke type symptoms 03/04/17 and was seen by neurology. An MRI that showed a 4 mm punctate focus involving the subcortical white matter of the left parietal lobe. A 2-D echo was normal. He is currently wearing an event monitor. He complains of being unsteady on his feet with a shuffling gait and is seeing a gait disorder neurologist at [facility] as well.   Current Meds  Medication Sig  . atorvastatin (LIPITOR) 40 MG tablet Take 1 tablet (40 mg total) by mouth daily.  . clopidogrel (PLAVIX) 75 MG tablet Take 1 tablet (75 mg total) by mouth daily.  . metoprolol succinate (TOPROL-XL) 50 MG 24 hr tablet TAKE 1 TABLET WITH OR IMMEDIATELY FOLLOWING A MEAL ONCE A DAY.  . pantoprazole (PROTONIX) 40 MG tablet Take 1 tablet (40 mg total) by mouth daily.  . ramipril (ALTACE) 5 MG capsule TAKE 1 CAPSULE DAILY.     No Known Allergies  Social History   Socioeconomic History  . Marital  status: Married    Spouse name: Not on file  . Number of children: Not on file  . Years of education: Not on file  . Highest education level: Not on file  Social Needs  . Financial resource strain: Not on file  . Food insecurity - worry: Not on file  . Food insecurity - inability: Not on file  . Transportation needs - medical: Not on file  . Transportation needs - non-medical: Not on file  Occupational History  . Not on file  Tobacco Use  . Smoking status: Never Smoker  . Smokeless tobacco: Never Used  Substance and Sexual Activity  . Alcohol use: Yes  . Drug use: No  . Sexual activity: Not on file  Other Topics Concern  . Not on file  Social History Narrative  . Not on file     Review of Systems: General: negative for chills, fever, night sweats or weight changes.  Cardiovascular: negative for chest pain, dyspnea on exertion, edema, orthopnea, palpitations, paroxysmal nocturnal dyspnea or shortness of breath Dermatological: negative for rash Respiratory: negative for cough or wheezing Urologic: negative for hematuria Abdominal: negative for nausea, vomiting, diarrhea, bright red blood per rectum, melena, or hematemesis Neurologic: negative for visual changes, syncope, or dizziness All other systems reviewed and are otherwise negative except as noted above.    Blood pressure 132/78, pulse 72, height 5\' 11"  (1.803 m), weight 198 lb 12.8 oz (90.2 kg).  General appearance: alert and no distress Neck: no adenopathy, no carotid bruit, no JVD, supple, symmetrical, trachea midline and thyroid not enlarged, symmetric, no tenderness/mass/nodules Lungs: clear to auscultation bilaterally Heart: regular rate and rhythm, S1, S2 normal, no murmur, click, rub or gallop Extremities: extremities normal, atraumatic, no cyanosis or edema Pulses: 2+ and symmetric Skin: Skin color, texture, turgor normal. No rashes or lesions Neurologic: Alert and oriented X 3, normal strength and tone.  Normal symmetric reflexes. Normal coordination and gait  EKG not performed today  ASSESSMENT AND PLAN:   Essential hypertension History of essential hypertension with blood pressure measured today at 132/78. He is on metoprolol and ramipril. Continue current meds at current dosing.  Hyperlipidemia with target LDL less than 130 History of hyperlipidemia on statin therapy with recent lipid profile performed 03/05/17 revealing a total cholesterol 122, LDL 57 and HDL of 51.  Coronary artery disease History of CAD status post bypass grafting February 2001 with a LIMA to his LAD, RIMA to the PDA, right radial to the first obtuse marginal branch, diagonal branch, second marginal branch and posterolateral branches. He'll IV stress test performed 05/15/14 which was entirely normal. He denies chest pain or shortness of breath.      Lorretta Harp MD FACP,FACC,FAHA, [facility] 03/21/2017 12:11 PM

## 2017-03-21 NOTE — Assessment & Plan Note (Signed)
History of CAD status post bypass grafting February 2001 with a LIMA to his LAD, RIMA to the PDA, right radial to the first obtuse marginal branch, diagonal branch, second marginal branch and posterolateral branches. He'll IV stress test performed 05/15/14 which was entirely normal. He denies chest pain or shortness of breath.

## 2017-03-21 NOTE — Assessment & Plan Note (Signed)
History of hyperlipidemia on statin therapy with recent lipid profile performed 03/05/17 revealing a total cholesterol 122, LDL 57 and HDL of 51.

## 2017-03-21 NOTE — Assessment & Plan Note (Signed)
History of essential hypertension with blood pressure measured today at 132/78. He is on metoprolol and ramipril. Continue current meds at current dosing.

## 2017-03-23 DIAGNOSIS — Z8673 Personal history of transient ischemic attack (TIA), and cerebral infarction without residual deficits: Secondary | ICD-10-CM | POA: Diagnosis not present

## 2017-03-23 DIAGNOSIS — M6281 Muscle weakness (generalized): Secondary | ICD-10-CM | POA: Diagnosis not present

## 2017-03-23 DIAGNOSIS — R2689 Other abnormalities of gait and mobility: Secondary | ICD-10-CM | POA: Diagnosis not present

## 2017-03-23 DIAGNOSIS — R2681 Unsteadiness on feet: Secondary | ICD-10-CM | POA: Diagnosis not present

## 2017-03-23 DIAGNOSIS — R278 Other lack of coordination: Secondary | ICD-10-CM | POA: Diagnosis not present

## 2017-03-28 ENCOUNTER — Ambulatory Visit: Payer: Self-pay | Admitting: Neurology

## 2017-03-28 DIAGNOSIS — M6281 Muscle weakness (generalized): Secondary | ICD-10-CM | POA: Diagnosis not present

## 2017-03-28 DIAGNOSIS — Z8673 Personal history of transient ischemic attack (TIA), and cerebral infarction without residual deficits: Secondary | ICD-10-CM | POA: Diagnosis not present

## 2017-03-28 DIAGNOSIS — R278 Other lack of coordination: Secondary | ICD-10-CM | POA: Diagnosis not present

## 2017-03-28 DIAGNOSIS — R2689 Other abnormalities of gait and mobility: Secondary | ICD-10-CM | POA: Diagnosis not present

## 2017-03-28 DIAGNOSIS — R2681 Unsteadiness on feet: Secondary | ICD-10-CM | POA: Diagnosis not present

## 2017-03-29 DIAGNOSIS — G20 Parkinson's disease: Secondary | ICD-10-CM | POA: Diagnosis not present

## 2017-03-29 DIAGNOSIS — R269 Unspecified abnormalities of gait and mobility: Secondary | ICD-10-CM | POA: Diagnosis not present

## 2017-03-30 DIAGNOSIS — G248 Other dystonia: Secondary | ICD-10-CM | POA: Diagnosis not present

## 2017-03-30 DIAGNOSIS — K219 Gastro-esophageal reflux disease without esophagitis: Secondary | ICD-10-CM | POA: Diagnosis not present

## 2017-03-30 DIAGNOSIS — G939 Disorder of brain, unspecified: Secondary | ICD-10-CM | POA: Diagnosis not present

## 2017-03-30 DIAGNOSIS — I6930 Unspecified sequelae of cerebral infarction: Secondary | ICD-10-CM | POA: Diagnosis not present

## 2017-04-02 DIAGNOSIS — I6381 Other cerebral infarction due to occlusion or stenosis of small artery: Secondary | ICD-10-CM | POA: Diagnosis not present

## 2017-04-02 DIAGNOSIS — G214 Vascular parkinsonism: Secondary | ICD-10-CM | POA: Diagnosis not present

## 2017-04-04 DIAGNOSIS — Z8673 Personal history of transient ischemic attack (TIA), and cerebral infarction without residual deficits: Secondary | ICD-10-CM | POA: Diagnosis not present

## 2017-04-04 DIAGNOSIS — I6300 Cerebral infarction due to thrombosis of unspecified precerebral artery: Secondary | ICD-10-CM | POA: Diagnosis not present

## 2017-04-04 DIAGNOSIS — M6281 Muscle weakness (generalized): Secondary | ICD-10-CM | POA: Diagnosis not present

## 2017-04-04 DIAGNOSIS — R2689 Other abnormalities of gait and mobility: Secondary | ICD-10-CM | POA: Diagnosis not present

## 2017-04-04 DIAGNOSIS — R278 Other lack of coordination: Secondary | ICD-10-CM | POA: Diagnosis not present

## 2017-04-04 DIAGNOSIS — R2681 Unsteadiness on feet: Secondary | ICD-10-CM | POA: Diagnosis not present

## 2017-04-06 DIAGNOSIS — R2689 Other abnormalities of gait and mobility: Secondary | ICD-10-CM | POA: Diagnosis not present

## 2017-04-06 DIAGNOSIS — R278 Other lack of coordination: Secondary | ICD-10-CM | POA: Diagnosis not present

## 2017-04-06 DIAGNOSIS — M6281 Muscle weakness (generalized): Secondary | ICD-10-CM | POA: Diagnosis not present

## 2017-04-06 DIAGNOSIS — R2681 Unsteadiness on feet: Secondary | ICD-10-CM | POA: Diagnosis not present

## 2017-04-06 DIAGNOSIS — I6300 Cerebral infarction due to thrombosis of unspecified precerebral artery: Secondary | ICD-10-CM | POA: Diagnosis not present

## 2017-04-06 DIAGNOSIS — Z8673 Personal history of transient ischemic attack (TIA), and cerebral infarction without residual deficits: Secondary | ICD-10-CM | POA: Diagnosis not present

## 2017-04-10 DIAGNOSIS — R278 Other lack of coordination: Secondary | ICD-10-CM | POA: Diagnosis not present

## 2017-04-10 DIAGNOSIS — R2681 Unsteadiness on feet: Secondary | ICD-10-CM | POA: Diagnosis not present

## 2017-04-10 DIAGNOSIS — I6300 Cerebral infarction due to thrombosis of unspecified precerebral artery: Secondary | ICD-10-CM | POA: Diagnosis not present

## 2017-04-10 DIAGNOSIS — M6281 Muscle weakness (generalized): Secondary | ICD-10-CM | POA: Diagnosis not present

## 2017-04-10 DIAGNOSIS — Z8673 Personal history of transient ischemic attack (TIA), and cerebral infarction without residual deficits: Secondary | ICD-10-CM | POA: Diagnosis not present

## 2017-04-10 DIAGNOSIS — R2689 Other abnormalities of gait and mobility: Secondary | ICD-10-CM | POA: Diagnosis not present

## 2017-04-12 DIAGNOSIS — R2689 Other abnormalities of gait and mobility: Secondary | ICD-10-CM | POA: Diagnosis not present

## 2017-04-12 DIAGNOSIS — I6300 Cerebral infarction due to thrombosis of unspecified precerebral artery: Secondary | ICD-10-CM | POA: Diagnosis not present

## 2017-04-12 DIAGNOSIS — M6281 Muscle weakness (generalized): Secondary | ICD-10-CM | POA: Diagnosis not present

## 2017-04-12 DIAGNOSIS — R2681 Unsteadiness on feet: Secondary | ICD-10-CM | POA: Diagnosis not present

## 2017-04-12 DIAGNOSIS — Z8673 Personal history of transient ischemic attack (TIA), and cerebral infarction without residual deficits: Secondary | ICD-10-CM | POA: Diagnosis not present

## 2017-04-12 DIAGNOSIS — R278 Other lack of coordination: Secondary | ICD-10-CM | POA: Diagnosis not present

## 2017-04-16 DIAGNOSIS — Z8673 Personal history of transient ischemic attack (TIA), and cerebral infarction without residual deficits: Secondary | ICD-10-CM | POA: Diagnosis not present

## 2017-04-16 DIAGNOSIS — R278 Other lack of coordination: Secondary | ICD-10-CM | POA: Diagnosis not present

## 2017-04-16 DIAGNOSIS — R2681 Unsteadiness on feet: Secondary | ICD-10-CM | POA: Diagnosis not present

## 2017-04-16 DIAGNOSIS — M6281 Muscle weakness (generalized): Secondary | ICD-10-CM | POA: Diagnosis not present

## 2017-04-16 DIAGNOSIS — R2689 Other abnormalities of gait and mobility: Secondary | ICD-10-CM | POA: Diagnosis not present

## 2017-04-16 DIAGNOSIS — I6300 Cerebral infarction due to thrombosis of unspecified precerebral artery: Secondary | ICD-10-CM | POA: Diagnosis not present

## 2017-04-17 DIAGNOSIS — M6281 Muscle weakness (generalized): Secondary | ICD-10-CM | POA: Diagnosis not present

## 2017-04-17 DIAGNOSIS — Z8673 Personal history of transient ischemic attack (TIA), and cerebral infarction without residual deficits: Secondary | ICD-10-CM | POA: Diagnosis not present

## 2017-04-17 DIAGNOSIS — R2681 Unsteadiness on feet: Secondary | ICD-10-CM | POA: Diagnosis not present

## 2017-04-17 DIAGNOSIS — R2689 Other abnormalities of gait and mobility: Secondary | ICD-10-CM | POA: Diagnosis not present

## 2017-04-17 DIAGNOSIS — R278 Other lack of coordination: Secondary | ICD-10-CM | POA: Diagnosis not present

## 2017-04-17 DIAGNOSIS — I6300 Cerebral infarction due to thrombosis of unspecified precerebral artery: Secondary | ICD-10-CM | POA: Diagnosis not present

## 2017-04-23 DIAGNOSIS — R2681 Unsteadiness on feet: Secondary | ICD-10-CM | POA: Diagnosis not present

## 2017-04-23 DIAGNOSIS — I6300 Cerebral infarction due to thrombosis of unspecified precerebral artery: Secondary | ICD-10-CM | POA: Diagnosis not present

## 2017-04-23 DIAGNOSIS — Z8673 Personal history of transient ischemic attack (TIA), and cerebral infarction without residual deficits: Secondary | ICD-10-CM | POA: Diagnosis not present

## 2017-04-23 DIAGNOSIS — M6281 Muscle weakness (generalized): Secondary | ICD-10-CM | POA: Diagnosis not present

## 2017-04-23 DIAGNOSIS — R2689 Other abnormalities of gait and mobility: Secondary | ICD-10-CM | POA: Diagnosis not present

## 2017-04-23 DIAGNOSIS — R278 Other lack of coordination: Secondary | ICD-10-CM | POA: Diagnosis not present

## 2017-04-26 DIAGNOSIS — R278 Other lack of coordination: Secondary | ICD-10-CM | POA: Diagnosis not present

## 2017-04-26 DIAGNOSIS — I6300 Cerebral infarction due to thrombosis of unspecified precerebral artery: Secondary | ICD-10-CM | POA: Diagnosis not present

## 2017-04-26 DIAGNOSIS — M6281 Muscle weakness (generalized): Secondary | ICD-10-CM | POA: Diagnosis not present

## 2017-04-26 DIAGNOSIS — R2681 Unsteadiness on feet: Secondary | ICD-10-CM | POA: Diagnosis not present

## 2017-04-26 DIAGNOSIS — Z8673 Personal history of transient ischemic attack (TIA), and cerebral infarction without residual deficits: Secondary | ICD-10-CM | POA: Diagnosis not present

## 2017-04-26 DIAGNOSIS — R2689 Other abnormalities of gait and mobility: Secondary | ICD-10-CM | POA: Diagnosis not present

## 2017-04-30 ENCOUNTER — Telehealth: Payer: Self-pay

## 2017-04-30 DIAGNOSIS — M6281 Muscle weakness (generalized): Secondary | ICD-10-CM | POA: Diagnosis not present

## 2017-04-30 DIAGNOSIS — E119 Type 2 diabetes mellitus without complications: Secondary | ICD-10-CM | POA: Diagnosis not present

## 2017-04-30 DIAGNOSIS — R2689 Other abnormalities of gait and mobility: Secondary | ICD-10-CM | POA: Diagnosis not present

## 2017-04-30 DIAGNOSIS — I6300 Cerebral infarction due to thrombosis of unspecified precerebral artery: Secondary | ICD-10-CM | POA: Diagnosis not present

## 2017-04-30 DIAGNOSIS — R278 Other lack of coordination: Secondary | ICD-10-CM | POA: Diagnosis not present

## 2017-04-30 DIAGNOSIS — R2681 Unsteadiness on feet: Secondary | ICD-10-CM | POA: Diagnosis not present

## 2017-04-30 DIAGNOSIS — Z8673 Personal history of transient ischemic attack (TIA), and cerebral infarction without residual deficits: Secondary | ICD-10-CM | POA: Diagnosis not present

## 2017-04-30 NOTE — Telephone Encounter (Signed)
Rn call patient that the cardiac monitor showed no irregular heart beat. Continue treatment plan. Pt verbalized understanding. Pt wanted a copy sent to Dr.Berry his cardiologist, and also his PCP. Rn stated Dr.Johnathan Gwenlyn Found read the report on his monitor. Rn stated a copy will be sent via epic to his PCP in [place]. ------

## 2017-04-30 NOTE — Telephone Encounter (Signed)
-----   Message from Rosalin Hawking, MD sent at 04/30/2017  4:22 PM EST ----- Could you please let the patient know that the heart monitoring test done showed no irregular heart beats. Please continue current treatment. Thanks.  Rosalin Hawking, MD PhD Stroke Neurology 04/30/2017 4:22 PM

## 2017-05-02 DIAGNOSIS — I6300 Cerebral infarction due to thrombosis of unspecified precerebral artery: Secondary | ICD-10-CM | POA: Diagnosis not present

## 2017-05-02 DIAGNOSIS — R2689 Other abnormalities of gait and mobility: Secondary | ICD-10-CM | POA: Diagnosis not present

## 2017-05-02 DIAGNOSIS — Z8673 Personal history of transient ischemic attack (TIA), and cerebral infarction without residual deficits: Secondary | ICD-10-CM | POA: Diagnosis not present

## 2017-05-02 DIAGNOSIS — R278 Other lack of coordination: Secondary | ICD-10-CM | POA: Diagnosis not present

## 2017-05-02 DIAGNOSIS — M6281 Muscle weakness (generalized): Secondary | ICD-10-CM | POA: Diagnosis not present

## 2017-05-02 DIAGNOSIS — R2681 Unsteadiness on feet: Secondary | ICD-10-CM | POA: Diagnosis not present

## 2017-05-14 ENCOUNTER — Encounter (INDEPENDENT_AMBULATORY_CARE_PROVIDER_SITE_OTHER): Payer: Self-pay

## 2017-05-14 ENCOUNTER — Encounter: Payer: Self-pay | Admitting: Neurology

## 2017-05-14 ENCOUNTER — Ambulatory Visit (INDEPENDENT_AMBULATORY_CARE_PROVIDER_SITE_OTHER): Payer: Medicare Other | Admitting: Neurology

## 2017-05-14 VITALS — BP 161/85 | HR 69 | Ht 72.0 in | Wt 194.0 lb

## 2017-05-14 DIAGNOSIS — I6381 Other cerebral infarction due to occlusion or stenosis of small artery: Secondary | ICD-10-CM | POA: Diagnosis not present

## 2017-05-14 DIAGNOSIS — G214 Vascular parkinsonism: Secondary | ICD-10-CM | POA: Diagnosis not present

## 2017-05-14 DIAGNOSIS — R0683 Snoring: Secondary | ICD-10-CM

## 2017-05-14 NOTE — Patient Instructions (Addendum)
Refer to sleep team [facility] PT for parkinsonism Continue current management

## 2017-05-14 NOTE — Progress Notes (Signed)
[facility]    Provider:  Dr Jaynee Eagles Referring Provider: Myer Peer, MD Primary Care Physician:  Myer Peer, MD  CC:  Stroke, vascular parkinsonism  HPI:  Joel Gonzales. is a 72 y.o. male here as a referral from Dr. Nicki Reaper for stroke follow up and vascular parkinsonism.  Patient was admitted to [facility] beginning of December 2018 with a history of progressive unsteady gait.  He is here with his wife and daughter who provide much information.  He noted that he had been having more difficulty getting up out of chairs, having to use his arms to stand up.  Symptoms started last August.  He feels as though he had balance problems which were progressive.  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  He noticed more shuffling, balance problems, falls but no tremors.  Noted having more difficulty getting up out of chairs and having to use his arms to stand up.  On the day of admission however he had an episode of uncoordination in both his arms and legs, lasted for a few minutes and then got better.  Primary care ordered an MRI of the brain.  Per report, MRI of the brain was negative but a repeat MRI of the brain in the emergency room did show acute stroke.   Patient does feel much improved, he feels his gait and imbalance have improved by about 80% since physical therapy.  He was diagnosed with vascular parkinsonism at [facility].  No treatment was suggested.  The stroke that was seen at [facility] was likely due to small vessel disease however they could not rule out embolic etiology in a 81-XBJ heart monitor was negative.  He was discharged on Plavix.  I reviewed MRI of the images with patient and his family, the stroke, the white matter changes that are likely the result of his vascular risk factors and causing his vascular parkinsonism, answered all questions.  Reviewed notes, labs and imaging from outside physicians,  which showed:  Personally reviewed MRI of the brain images and agree with the following.  LDL 57, hemoglobin A1c 7  1. Punctate 4 mm acute ischemic nonhemorrhagic small vessel type infarct involving the subcortical white matter of the left parietal lobe. 2. No other acute intracranial abnormality. 3. Moderate chronic small vessel ischemic disease, stable. 4. Multiple scattered chronic micro hemorrhages throughout the brain, similar to previous, again favored to reflect the sequelae of chronic hypertension.  1. No acute abnormality within the cervical spine. 2. 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 3. Multifactorial degenerative changes with resultant multilevel foraminal narrowing as above. Notable findings include moderate right C4, C5, and C6 foraminal stenosis, severe left C6 foraminal narrowing, with mild to moderate bilateral C7 foraminal stenosis. 4. Reactive edema about the left C2-3 facet due to facet arthritis. Finding could serve as a source for neck pain.  A DAT scan for report was negative for Parkinson's disorder, reviewed report  Reviewed notes from [place], DAT scan did not show overt decrease in dopaminergic transmission.  He was asked to continue PT.  Diagnosed with parkinsonism, likely vascular parkinsonism at the [facility] movement disorder center.  Review of Systems: Patient complains of symptoms per HPI as well as the following symptoms: Imbalance, falls, stroke, neck pain, joint pain. Pertinent negatives and positives per HPI. All others negative.   Social History   Socioeconomic History  .  Marital status: Married    Spouse name: Not on file  . Number of children: 1  . Years of education: Not on file  . Highest education level: Bachelor's degree (e.g., BA, AB, BS)  Social Needs  . Financial resource strain: Not on file  . Food insecurity - worry: Not on file  . Food insecurity - inability: Not on file  .  Transportation needs - medical: Not on file  . Transportation needs - non-medical: Not on file  Occupational History    Comment: [employer]  Tobacco Use  . Smoking status: Never Smoker  . Smokeless tobacco: Never Used  Substance and Sexual Activity  . Alcohol use: Yes    Comment: minor  . Drug use: No  . Sexual activity: Not on file  Other Topics Concern  . Not on file  Social History Narrative   Lives at home with his wife   Right handed   Drinks at least 1 cup daily of caffeine    Family History  Problem Relation Age of Onset  . Heart attack Father 68       deceased, ASCVD  . Hypertension Father   . Heart disease Father   . Heart Problems Mother        ASCVD  . Hypertension Mother   . Heart attack Mother   . Heart attack Sister   . Hypertension Brother   . Hydrocephalus Brother     Past Medical History:  Diagnosis Date  . CAD (coronary artery disease)   . Diabetes mellitus without complication (HCC)    Type 2  . Family history of ASCVD   . History of stress test 01/2012   which was entirely normal  . Hx of CABG    x6 LIma to his LAD, a RIMA to his PDA, right radial to obtuse marginal branch, diagnonal, second marginal, & posteolateral branches  . Hyperlipemia   . Hypertension   . RBBB    chronic    Past Surgical History:  Procedure Laterality Date  . CHOLECYSTECTOMY  2002  . CORONARY ARTERY BYPASS GRAFT     x6 LIma to his LAD, a RIMA to his PDA, right radial to obtuse marginal branch, diagnonal, second marginal, & posteolateral branches  . NOSE SURGERY  02/2013   carcinoma  . TONSILLECTOMY     as a child    Current Outpatient Medications  Medication Sig Dispense Refill  . atorvastatin (LIPITOR) 40 MG tablet Take 1 tablet (40 mg total) by mouth daily. 90 tablet 3  . clopidogrel (PLAVIX) 75 MG tablet Take 1 tablet (75 mg total) by mouth daily. 30 tablet 0  . metoprolol succinate (TOPROL-XL) 50 MG 24 hr tablet TAKE 1 TABLET WITH OR  IMMEDIATELY FOLLOWING A MEAL ONCE A DAY. 90 tablet 2  . pantoprazole (PROTONIX) 40 MG tablet Take 1 tablet (40 mg total) by mouth daily. 90 tablet 3  . ramipril (ALTACE) 5 MG capsule TAKE 1 CAPSULE DAILY. (Patient taking differently: TAKE 1 CAPSULE TWICE DAILY.) 30 capsule 0   No current facility-administered medications for this visit.     Allergies as of 05/14/2017  . (No Known Allergies)    Vitals: BP (!) 161/85 (BP Location: Right Arm, Patient Position: Sitting) Comment: has been elevated recently per pt; recent increase in BP med  Pulse 69   Ht 6' (1.829 m)   Wt 194 lb (88 kg)   BMI 26.31 kg/m  Last Weight:  Wt Readings from Last 1  Encounters:  05/14/17 194 lb (88 kg)   Last Height:   Ht Readings from Last 1 Encounters:  05/14/17 6' (1.829 m)   Physical exam: Exam: Gen: NAD                     CV: RRR, no MRG. No Carotid Bruits. No peripheral edema, warm, nontender Eyes: Conjunctivae clear without exudates or hemorrhage  Neuro: Detailed Neurologic Exam   Speech:    Speech is normal; fluent and spontaneous with normal comprehension.  Cognition:    The patient is oriented to person, place, and time;     recent and remote memory intact;     language fluent;     normal attention, concentration,     fund of knowledge Cranial Nerves:    The pupils are equal, round, and reactive to light. Attempted fundoscopic exam could not visualize. . Visual fields are full to finger confrontation. Extraocular movements are intact. Trigeminal sensation is intact and the muscles of mastication are normal. The face is symmetric. The palate elevates in the midline. Hearing intact. Voice is normal. Shoulder shrug is normal. The tongue has normal motion without fasciculations.   Coordination:    No dysmetria  Gait:    Narrow based, good stride, decreased arm swing, bardykinesia  Motor Observation:    No asymmetry, no atrophy, and no involuntary movements noted. Tone:    Slight  cogwheel right UE and mild spasticity right hand  Posture:    Slightly stooped    Strength:    Strength is V/V in the upper and lower limbs.      Sensation: intact to LT     Reflex Exam:  DTR's:    Deep tendon reflexes in the upper and lower extremities are normal bilaterally.   Toes:    The toes are downgoing bilaterally.   Clonus:    Clonus is absent.       Assessment/Plan: 72 year old male with a history of coronary artery disease status post CABG, diabetes, hypertension, hyperlipidemia here for follow-up of lacunar infarct.  Also recently diagnosed with vascular parkinsonism at [facility] movement disorders center.  Lacunar stroke: I had a long d/w patient about her recent stroke, risk for recurrent stroke/TIAs, personally independently reviewed imaging studies and stroke evaluation results and answered questions.Continue Plavix for secondary stroke prevention and maintain strict control of hypertension with blood pressure goal below 130/90, diabetes with hemoglobin A1c goal below 6.5% and lipids with LDL cholesterol goal below 70 mg/dL.I also advised the patient to eat a healthy diet with plenty of whole grains, cereals, fruits and vegetables, exercise regularly and maintain ideal body weight. 30-day cardiac event monitor negative for afib/flutter.   Vascular parkinsonism: Diagnosed with vascular parkinsonism at [facility] movement disorders center.  Discussed the diagnosis, answered all questions, reviewed images with patient pointing out white matter changes likely due to his vascular risk factors.  DAT scan was negative for Parkinson's disorders.  Recommended increased exercise, will refer to physical therapy for parkinsonism and balance exercises, provided recommendations and sources of information for vascular parkinsonism, will also ask physical therapy to review Epley maneuvers for vertigo.  Very loud snoring, stroke: Sleep study   Orders Placed This Encounter  Procedures  .  Ambulatory referral to Sleep Studies  . Ambulatory referral to Physical Therapy      Joel Ill, MD  [facility] [address] [place], [place] [address]  Phone [phone] Fax [phone]

## 2017-05-16 ENCOUNTER — Telehealth: Payer: Self-pay | Admitting: Neurology

## 2017-05-16 NOTE — Telephone Encounter (Signed)
Joel Gonzales, I placed referral for physical therapy for parkinsonism. But he wants to go to [name]. Can you make sure a referral gets there please? thanks

## 2017-05-17 NOTE — Telephone Encounter (Signed)
Order has been sent to [facility] for PT. Thanks Hinton Dyer.

## 2017-05-23 DIAGNOSIS — I10 Essential (primary) hypertension: Secondary | ICD-10-CM | POA: Diagnosis not present

## 2017-05-23 DIAGNOSIS — Z6827 Body mass index (BMI) 27.0-27.9, adult: Secondary | ICD-10-CM | POA: Diagnosis not present

## 2017-05-23 DIAGNOSIS — J22 Unspecified acute lower respiratory infection: Secondary | ICD-10-CM | POA: Diagnosis not present

## 2017-06-14 ENCOUNTER — Institutional Professional Consult (permissible substitution): Payer: Medicare Other | Admitting: Neurology

## 2017-07-02 DIAGNOSIS — R278 Other lack of coordination: Secondary | ICD-10-CM | POA: Diagnosis not present

## 2017-07-02 DIAGNOSIS — G214 Vascular parkinsonism: Secondary | ICD-10-CM | POA: Diagnosis not present

## 2017-07-02 DIAGNOSIS — R2689 Other abnormalities of gait and mobility: Secondary | ICD-10-CM | POA: Diagnosis not present

## 2017-07-02 DIAGNOSIS — R2681 Unsteadiness on feet: Secondary | ICD-10-CM | POA: Diagnosis not present

## 2017-08-01 ENCOUNTER — Other Ambulatory Visit: Payer: Self-pay | Admitting: Cardiovascular Disease

## 2017-08-02 NOTE — Telephone Encounter (Signed)
Rx(s) sent to pharmacy electronically.  

## 2017-08-29 DIAGNOSIS — E119 Type 2 diabetes mellitus without complications: Secondary | ICD-10-CM | POA: Diagnosis not present

## 2017-08-29 DIAGNOSIS — Z1331 Encounter for screening for depression: Secondary | ICD-10-CM | POA: Diagnosis not present

## 2017-08-29 DIAGNOSIS — Z79899 Other long term (current) drug therapy: Secondary | ICD-10-CM | POA: Diagnosis not present

## 2017-08-29 DIAGNOSIS — Z0000 Encounter for general adult medical examination without abnormal findings: Secondary | ICD-10-CM | POA: Diagnosis not present

## 2017-08-29 DIAGNOSIS — I679 Cerebrovascular disease, unspecified: Secondary | ICD-10-CM | POA: Diagnosis not present

## 2017-08-29 DIAGNOSIS — G214 Vascular parkinsonism: Secondary | ICD-10-CM | POA: Diagnosis not present

## 2017-09-03 DIAGNOSIS — H2513 Age-related nuclear cataract, bilateral: Secondary | ICD-10-CM | POA: Diagnosis not present

## 2017-09-03 DIAGNOSIS — H52203 Unspecified astigmatism, bilateral: Secondary | ICD-10-CM | POA: Diagnosis not present

## 2017-09-03 DIAGNOSIS — E113291 Type 2 diabetes mellitus with mild nonproliferative diabetic retinopathy without macular edema, right eye: Secondary | ICD-10-CM | POA: Diagnosis not present

## 2017-09-03 DIAGNOSIS — H25041 Posterior subcapsular polar age-related cataract, right eye: Secondary | ICD-10-CM | POA: Diagnosis not present

## 2017-12-28 DIAGNOSIS — Z23 Encounter for immunization: Secondary | ICD-10-CM | POA: Diagnosis not present

## 2018-02-15 ENCOUNTER — Telehealth: Payer: Self-pay | Admitting: Cardiovascular Disease

## 2018-02-15 NOTE — Telephone Encounter (Signed)
Returned call to patient.Advised Dr.Berry is out of office next week.Stated he has appointment with Kerin Ransom PA 03/07/18.He will ask then.

## 2018-02-15 NOTE — Telephone Encounter (Signed)
New Message  Pt calling, states his primary physician is retiring and he wanted to know if Dr. Gwenlyn Found could recommend another pcp. Please call

## 2018-03-07 ENCOUNTER — Encounter: Payer: Self-pay | Admitting: Cardiology

## 2018-03-07 ENCOUNTER — Ambulatory Visit (INDEPENDENT_AMBULATORY_CARE_PROVIDER_SITE_OTHER): Payer: Medicare Other | Admitting: Cardiology

## 2018-03-07 VITALS — BP 128/72 | HR 60 | Ht 71.0 in | Wt 196.8 lb

## 2018-03-07 DIAGNOSIS — Z951 Presence of aortocoronary bypass graft: Secondary | ICD-10-CM

## 2018-03-07 DIAGNOSIS — I6381 Other cerebral infarction due to occlusion or stenosis of small artery: Secondary | ICD-10-CM

## 2018-03-07 DIAGNOSIS — I639 Cerebral infarction, unspecified: Secondary | ICD-10-CM

## 2018-03-07 DIAGNOSIS — E785 Hyperlipidemia, unspecified: Secondary | ICD-10-CM

## 2018-03-07 DIAGNOSIS — I10 Essential (primary) hypertension: Secondary | ICD-10-CM | POA: Diagnosis not present

## 2018-03-07 DIAGNOSIS — I4510 Unspecified right bundle-branch block: Secondary | ICD-10-CM

## 2018-03-07 NOTE — Assessment & Plan Note (Signed)
CABG 2001- Myoview low risk 2016- normal LVF by echo Dec 2018. ASA changed to Plavix for CVA

## 2018-03-07 NOTE — Progress Notes (Signed)
03/07/2018 [name]   10-Feb-1946  [id number]  Primary Physician Tisovec, Fransico Him, MD Primary Cardiologist: Dr Gwenlyn Found  HPI: [name] is a pleasant 72 year old male followed by Dr. Gwenlyn Found.  He has a history of coronary disease and had bypass grafting in 2001.  Myoview study in 2016 was low risk.  Echocardiogram in December 2018 showed normal LV function with an EF of 60 to 65%.  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  Patient lives in [place].  He runs a nursery.  He is active at work.  He denies any chest pain, unusual shortness of breath, or tachycardia.  Back in December 2018 he had a fairly significant gait issue.  He says this is 95% resolved with physical therapy.  He was placed on Plavix after his stroke in December 2018.  He is in the office today for a one-year checkup.  He has changed primary care providers, he will be seeing Dr. Osborne Casco, Dr. Nicki Reaper is retiring.   Current Outpatient Medications  Medication Sig Dispense Refill  . atorvastatin (LIPITOR) 40 MG tablet Take 1 tablet (40 mg total) by mouth daily. 90 tablet 3  . clopidogrel (PLAVIX) 75 MG tablet Take 1 tablet (75 mg total) by mouth daily. 30 tablet 0  . metFORMIN (GLUCOPHAGE) 1000 MG tablet Take 1 tablet by mouth daily.    . metoprolol succinate (TOPROL-XL) 50 MG 24 hr tablet TAKE 1 TABLET WITH OR IMMEDIATELY FOLLOWING A MEAL ONCE A DAY. 90 tablet 2  . pantoprazole (PROTONIX) 40 MG tablet Take 1 tablet (40 mg total) by mouth daily. 90 tablet 3  . ramipril (ALTACE) 5 MG capsule Take 5 mg by mouth 2 (two) times daily.    Marland Kitchen spironolactone-hydrochlorothiazide (ALDACTAZIDE) 25-25 MG tablet Take 0.5 tablets by mouth daily.     No current facility-administered medications for this visit.     No Known Allergies  Past Medical History:  Diagnosis Date  . CAD (coronary artery disease)   . Diabetes  mellitus without complication (HCC)    Type 2  . Family history of ASCVD   . History of stress test 01/2012   which was entirely normal  . Hx of CABG    x6 LIma to his LAD, a RIMA to his PDA, right radial to obtuse marginal branch, diagnonal, second marginal, & posteolateral branches  . Hyperlipemia   . Hypertension   . RBBB    chronic    Social History   Socioeconomic History  . Marital status: Married    Spouse name: Not on file  . Number of children: 1  . Years of education: Not on file  . Highest education level: Bachelor's degree (e.g., BA, AB, BS)  Occupational History    Comment: [employer]  Social Needs  . Financial resource strain: Not on file  . Food insecurity:    Worry: Not on file    Inability: Not on file  . Transportation needs:    Medical: Not on file    Non-medical: Not on file  Tobacco Use  . Smoking status: Never Smoker  . Smokeless tobacco: Never Used  Substance and Sexual Activity  . Alcohol use: Yes    Comment: minor  . Drug use: No  . Sexual activity: Not on file  Lifestyle  . Physical activity:    Days per week: Not on file    Minutes per session:  Not on file  . Stress: Not on file  Relationships  . Social connections:    Talks on phone: Not on file    Gets together: Not on file    Attends religious service: Not on file    Active member of club or organization: Not on file    Attends meetings of clubs or organizations: Not on file    Relationship status: Not on file  . Intimate partner violence:    Fear of current or ex partner: Not on file    Emotionally abused: Not on file    Physically abused: Not on file    Forced sexual activity: Not on file  Other Topics Concern  . Not on file  Social History Narrative   Lives at home with his wife   Right handed   Drinks at least 1 cup daily of caffeine     Family History  Problem Relation Age of Onset  . Heart attack Father 59       deceased, ASCVD  . Hypertension Father     . Heart disease Father   . Heart Problems Mother        ASCVD  . Hypertension Mother   . Heart attack Mother   . Heart attack Sister   . Hypertension Brother   . Hydrocephalus Brother      Review of Systems: General: negative for chills, fever, night sweats or weight changes.  Cardiovascular: negative for chest pain, dyspnea on exertion, edema, orthopnea, palpitations, paroxysmal nocturnal dyspnea or shortness of breath Dermatological: negative for rash Respiratory: negative for cough or wheezing Urologic: negative for hematuria Abdominal: negative for nausea, vomiting, diarrhea, bright red blood per rectum, melena, or hematemesis Neurologic: negative for visual changes, syncope, or dizziness All other systems reviewed and are otherwise negative except as noted above.    Blood pressure 128/72, pulse 60, height 5\' 11"  (1.803 m), weight 196 lb 12.8 oz (89.3 kg).  General appearance: alert, cooperative and no distress Neck: no carotid bruit and no JVD Lungs: clear to auscultation bilaterally Heart: regular rate and rhythm Extremities: no edema Skin: warm and dry Neurologic: Grossly normal  EKG normal sinus rhythm with a right bundle branch block, heart rate is 60  ASSESSMENT AND PLAN:   Hx of CABG CABG 2001- Myoview low risk 2016- normal LVF by echo Dec 2018  Acute ischemic stroke [facility]) CABG 2001- Myoview low risk 2016- normal LVF by echo Dec 2018. ASA changed to Plavix for CVA  Essential hypertension Controlled  Hyperlipidemia with target LDL less than 130 Due for lipids- will defer to PCP as he will be fasting for that appointment   PLAN  F/U Dr Gwenlyn Found in one year- no change in Rx  [place] PA-C 03/07/2018 11:06 AM

## 2018-03-07 NOTE — Patient Instructions (Addendum)
Medication Instructions:   NONE ORDERED  TODAY  If you need a refill on your cardiac medications before your next appointment, please call your pharmacy.   Lab work: NONE ORDERED  TODAY  If you have labs (blood work) drawn today and your tests are completely normal, you will receive your results only by: Marland Kitchen MyChart Message (if you have MyChart) OR . A paper copy in the mail If you have any lab test that is abnormal or we need to change your treatment, we will call you to review the results.  Testing/Procedures:  NONE ORDERED  TODAY   Follow-Up: At [facility], you and your health needs are our priority.  As part of our continuing mission to provide you with exceptional heart care, we have created designated Provider Care Teams.  These Care Teams include your primary Cardiologist (physician) and Advanced Practice Providers (APPs -  Physician Assistants and Nurse Practitioners) who all work together to provide you with the care you need, when you need it. You will need a follow up appointment in 1 years.  Please call our office 2 months in advance to schedule this appointment.  You may see Dr. Gwenlyn Found  or one of the following Advanced Practice Providers on your designated Care Team:   Kerin Ransom, PA-C Roby Lofts, [place] . Sande Rives, PA-C  Any Other Special Instructions Will Be Listed Below (If Applicable).

## 2018-03-07 NOTE — Assessment & Plan Note (Signed)
CABG 2001- Myoview low risk 2016- normal LVF by echo Dec 2018

## 2018-03-07 NOTE — Assessment & Plan Note (Signed)
Controlled.  

## 2018-03-07 NOTE — Assessment & Plan Note (Signed)
Due for lipids- will defer to PCP as he will be fasting for that appointment

## 2018-03-12 DIAGNOSIS — E1151 Type 2 diabetes mellitus with diabetic peripheral angiopathy without gangrene: Secondary | ICD-10-CM | POA: Insufficient documentation

## 2018-03-12 DIAGNOSIS — E7800 Pure hypercholesterolemia, unspecified: Secondary | ICD-10-CM | POA: Insufficient documentation

## 2018-03-12 DIAGNOSIS — I679 Cerebrovascular disease, unspecified: Secondary | ICD-10-CM | POA: Insufficient documentation

## 2018-03-12 DIAGNOSIS — I1310 Hypertensive heart and chronic kidney disease without heart failure, with stage 1 through stage 4 chronic kidney disease, or unspecified chronic kidney disease: Secondary | ICD-10-CM | POA: Insufficient documentation

## 2018-03-12 DIAGNOSIS — H269 Unspecified cataract: Secondary | ICD-10-CM | POA: Insufficient documentation

## 2018-04-17 DIAGNOSIS — I10 Essential (primary) hypertension: Secondary | ICD-10-CM | POA: Diagnosis not present

## 2018-04-17 DIAGNOSIS — Z1389 Encounter for screening for other disorder: Secondary | ICD-10-CM | POA: Diagnosis not present

## 2018-04-17 DIAGNOSIS — I25810 Atherosclerosis of coronary artery bypass graft(s) without angina pectoris: Secondary | ICD-10-CM | POA: Diagnosis not present

## 2018-04-17 DIAGNOSIS — E1165 Type 2 diabetes mellitus with hyperglycemia: Secondary | ICD-10-CM | POA: Diagnosis not present

## 2018-04-17 DIAGNOSIS — E1151 Type 2 diabetes mellitus with diabetic peripheral angiopathy without gangrene: Secondary | ICD-10-CM | POA: Diagnosis not present

## 2018-04-17 DIAGNOSIS — I6789 Other cerebrovascular disease: Secondary | ICD-10-CM | POA: Diagnosis not present

## 2018-04-17 DIAGNOSIS — E7800 Pure hypercholesterolemia, unspecified: Secondary | ICD-10-CM | POA: Diagnosis not present

## 2018-04-17 DIAGNOSIS — Z6827 Body mass index (BMI) 27.0-27.9, adult: Secondary | ICD-10-CM | POA: Diagnosis not present

## 2018-04-17 DIAGNOSIS — E663 Overweight: Secondary | ICD-10-CM | POA: Insufficient documentation

## 2018-04-17 DIAGNOSIS — Z0000 Encounter for general adult medical examination without abnormal findings: Secondary | ICD-10-CM | POA: Insufficient documentation

## 2018-05-14 DIAGNOSIS — Z951 Presence of aortocoronary bypass graft: Secondary | ICD-10-CM | POA: Diagnosis not present

## 2018-05-14 DIAGNOSIS — E119 Type 2 diabetes mellitus without complications: Secondary | ICD-10-CM | POA: Diagnosis not present

## 2018-05-17 DIAGNOSIS — Z951 Presence of aortocoronary bypass graft: Secondary | ICD-10-CM | POA: Diagnosis not present

## 2018-05-17 DIAGNOSIS — E119 Type 2 diabetes mellitus without complications: Secondary | ICD-10-CM | POA: Diagnosis not present

## 2018-05-31 DIAGNOSIS — Z951 Presence of aortocoronary bypass graft: Secondary | ICD-10-CM | POA: Diagnosis not present

## 2018-05-31 DIAGNOSIS — E119 Type 2 diabetes mellitus without complications: Secondary | ICD-10-CM | POA: Diagnosis not present

## 2018-07-16 DIAGNOSIS — I10 Essential (primary) hypertension: Secondary | ICD-10-CM | POA: Diagnosis not present

## 2018-07-16 DIAGNOSIS — E663 Overweight: Secondary | ICD-10-CM | POA: Diagnosis not present

## 2018-07-16 DIAGNOSIS — E7800 Pure hypercholesterolemia, unspecified: Secondary | ICD-10-CM | POA: Diagnosis not present

## 2018-07-16 DIAGNOSIS — E1165 Type 2 diabetes mellitus with hyperglycemia: Secondary | ICD-10-CM | POA: Diagnosis not present

## 2018-07-16 DIAGNOSIS — E1151 Type 2 diabetes mellitus with diabetic peripheral angiopathy without gangrene: Secondary | ICD-10-CM | POA: Diagnosis not present

## 2018-07-16 DIAGNOSIS — I25810 Atherosclerosis of coronary artery bypass graft(s) without angina pectoris: Secondary | ICD-10-CM | POA: Diagnosis not present

## 2018-07-16 DIAGNOSIS — I679 Cerebrovascular disease, unspecified: Secondary | ICD-10-CM | POA: Diagnosis not present

## 2018-07-24 IMAGING — MR MR HEAD W/O CM
8 of 10 series · 32 of 48 positions shown · non-contrast
Comparison: Prior CT from earlier the same day as well as recent
MRI from 02/28/2017.

CLINICAL DATA: Initial evaluation for slowly progressive ataxia 4
months.

EXAM:
MRI HEAD WITHOUT CONTRAST
MRI CERVICAL SPINE WITHOUT CONTRAST
TECHNIQUE: Multiplanar, multiecho pulse sequences of the brain and surrounding
structures, and cervical spine, to include the craniocervical
junction and cervicothoracic junction, were obtained without
intravenous contrast.

[Series 3: DWI · axial · 3.0mm · 0.94mm/px · z∈[-91,+62]mm · 9 of 104 slices shown (1 of 2)]
[im 1/104]
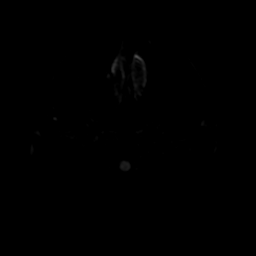
[im 13/104]
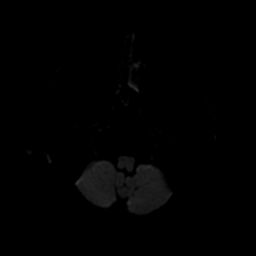
[im 26/104]
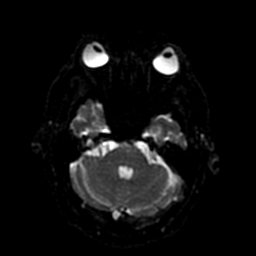
[im 39/104]
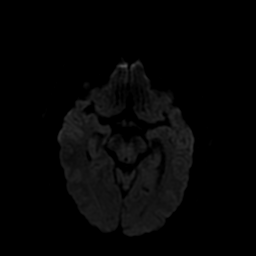
[im 52/104]
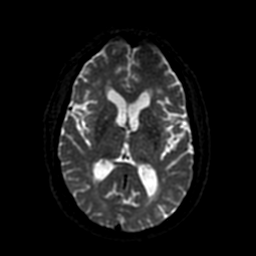
[im 65/104]
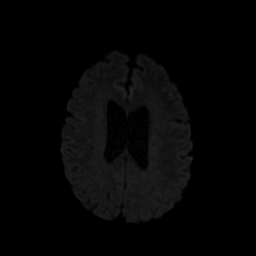
[im 78/104]
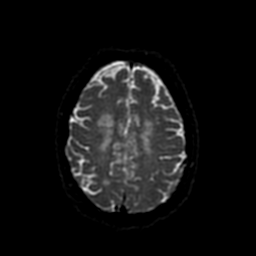
[im 91/104]
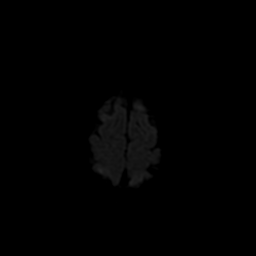
[im 104/104]
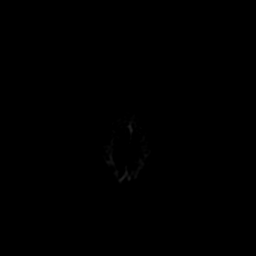

[Series 4: FLAIR · sagittal · 5.0mm · 0.47mm/px · 2 of 23 slices shown (1 of 2)]
[im 1/23]
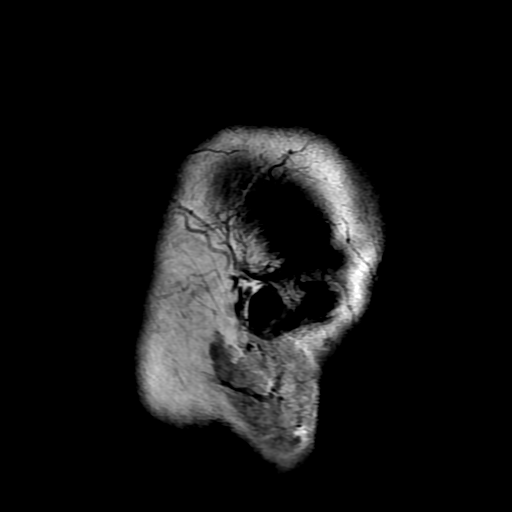
[im 23/23]
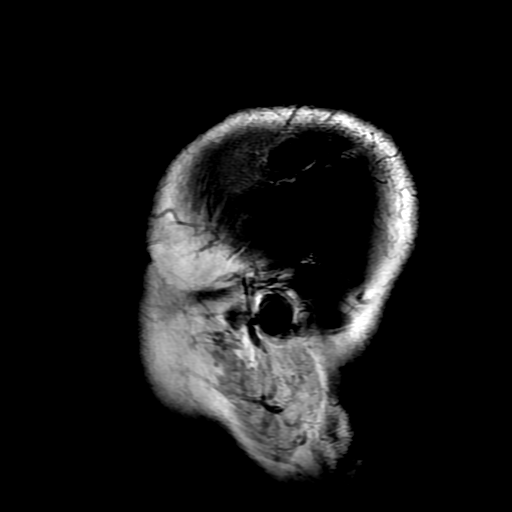

[Series 5: DWI · coronal · 4.0mm · 0.94mm/px · 7 of 72 slices shown (2 of 2)]
[im 1/72]
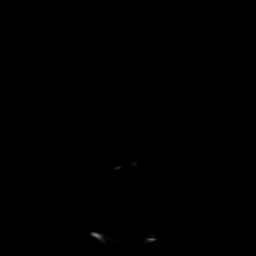
[im 12/72]
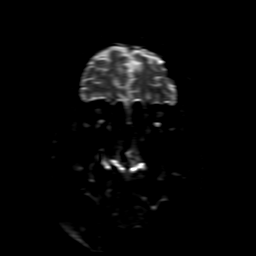
[im 24/72]
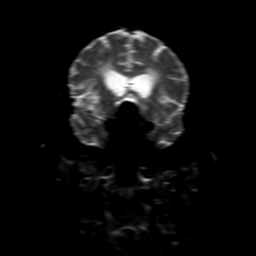
[im 36/72]
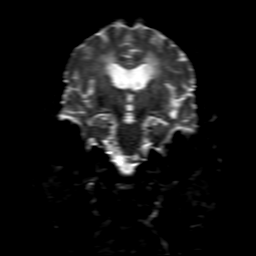
[im 48/72]
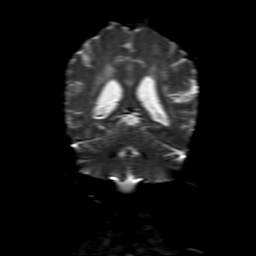
[im 60/72]
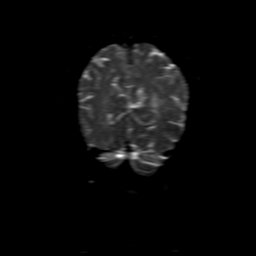
[im 72/72]
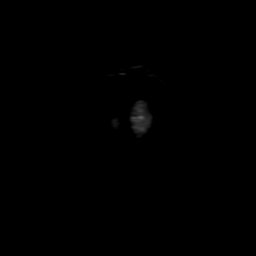

[Series 6: T2 · axial · 5.0mm · 0.43mm/px · z∈[-84,+66]mm · 2 of 26 slices shown (1 of 2)]
[im 1/26]
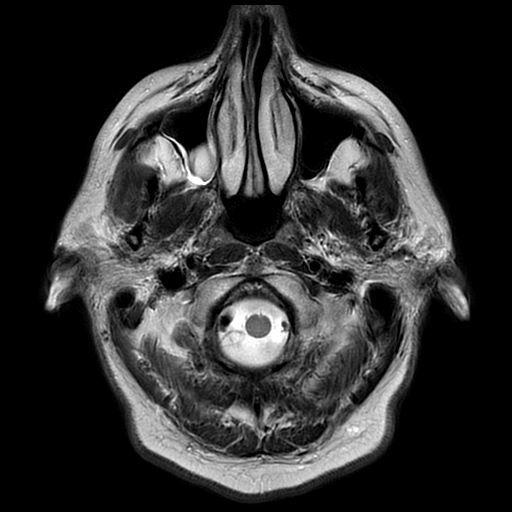
[im 26/26]
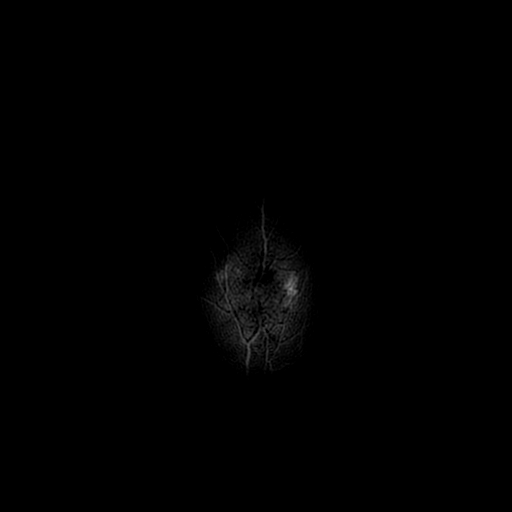

[Series 7: FLAIR · axial · 3.0mm · 0.43mm/px · z∈[-84,+66]mm · 2 of 26 slices shown (2 of 2)]
[im 1/26]
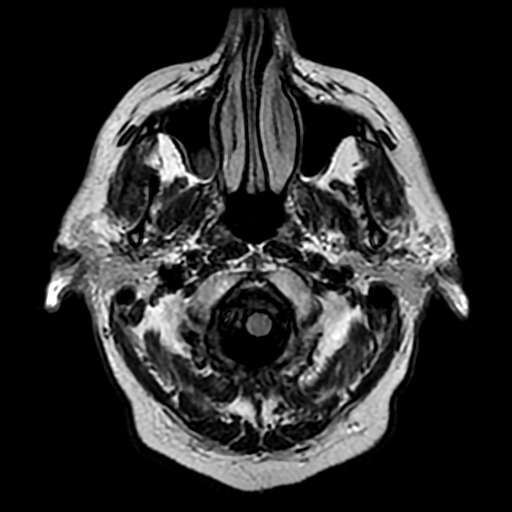
[im 26/26]
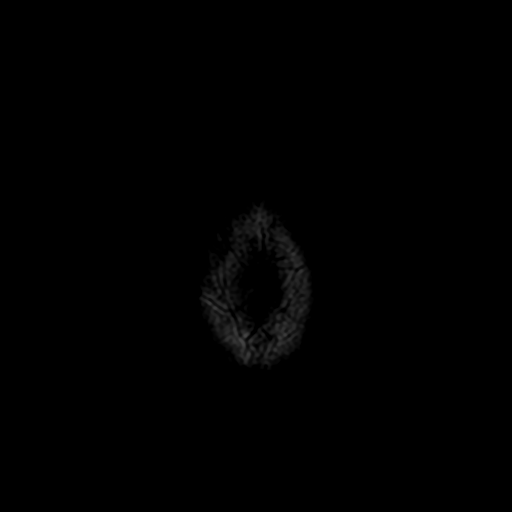

[Series 10: T2 · coronal · 5.0mm · 0.39mm/px · 2 of 30 slices shown (2 of 2)]
[im 1/30]
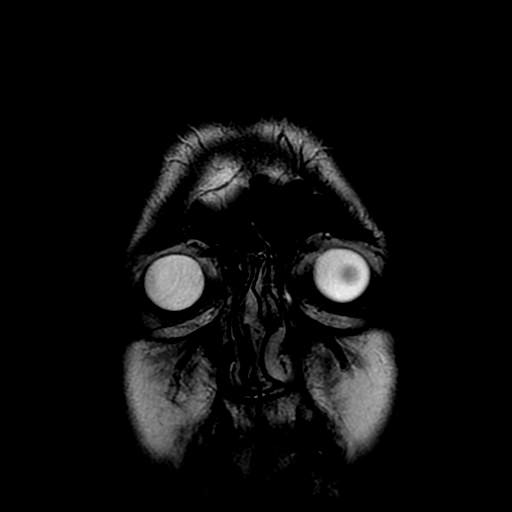
[im 15/30]
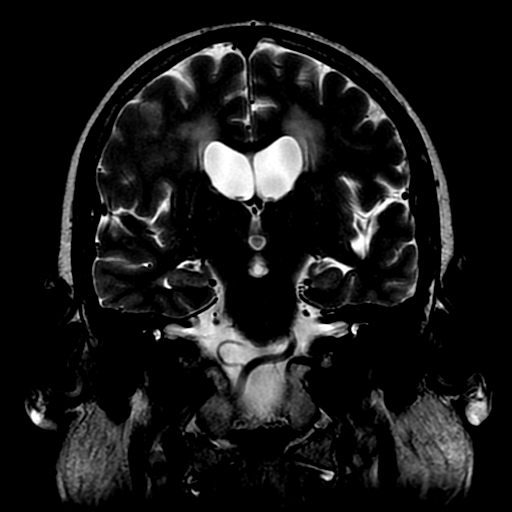

[Series 350: ADC · axial · 3.0mm · 0.94mm/px · z∈[-91,+62]mm · 5 of 52 slices shown (1 of 2)]
[im 1/52]
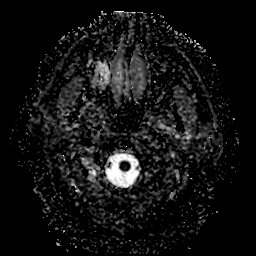
[im 13/52]
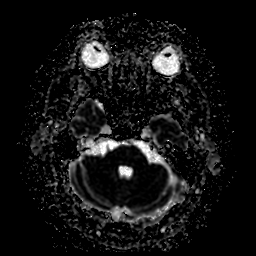
[im 26/52]
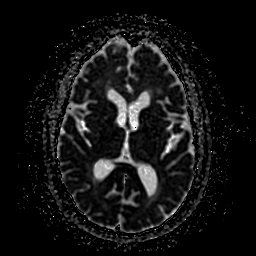
[im 39/52]
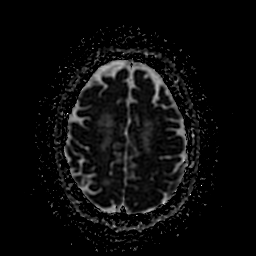
[im 52/52]
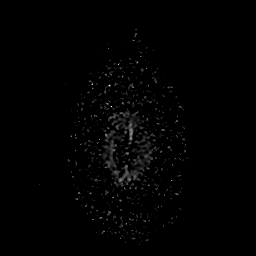

[Series 550: ADC · coronal · 4.0mm · 0.94mm/px · 3 of 36 slices shown (2 of 2)]
[im 1/36]
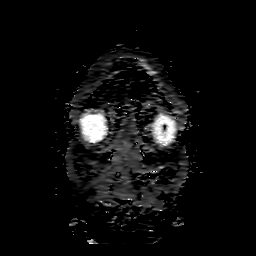
[im 18/36]
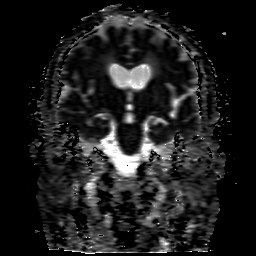
[im 36/36]
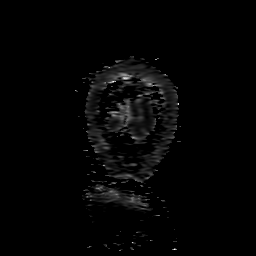

[32 of 48 positions shown; findings below may reference images not displayed]

FINDINGS: MRI HEAD FINDINGS

Brain: Generalized age related cerebral atrophy. Patchy and
confluent T2/FLAIR hyperintensity involving the periventricular deep
white matter both cerebral hemispheres, most consistent with chronic
small vessel ischemic disease, moderate nature.

Punctate 4 mm focus of diffusion abnormality seen involving the
subcortical white matter of the left parietal lobe (series 3, image
31). Corresponding signal loss seen on ADC map (series 350, image
31). This is also seen on coronal DWI sequence (series 5, image 8).
Finding is new relative to most recent MRI, and suspicious for a
tiny acute small vessel type infarct. No associated hemorrhage.

No other evidence for acute or subacute ischemia. Gray-white matter
differentiation otherwise maintained. No other evidence for chronic
infarction. No acute intracranial hemorrhage. Multiple subcentimeter
foci of susceptibility artifact seen throughout the brain, primarily
involving the cerebellum and deep gray nuclei, most consistent with
small chronic micro hemorrhages. Findings are suspected to be
related to underlying chronic hypertension.

No mass lesion, midline shift or mass effect. No hydrocephalus. No
extra-axial fluid collection. Major dural sinuses are grossly
patent.

Pituitary gland suprasellar region within normal limits. Midline
structures intact and normal.

Vascular: Major intracranial vascular flow voids are maintained.

Skull and upper cervical spine: Craniocervical junction normal. Bone
marrow signal intensity within normal limits. No scalp soft tissue
abnormality.

Sinuses/Orbits: Globes and orbital soft tissues within normal
limits. Mild scattered mucosal thickening within the ethmoidal air
cells and right maxillary sinus. Right maxillary sinus retention
cyst partially visualized. Paranasal sinuses are otherwise clear. No
mastoid effusion. Inner ear structures normal.

Other: None.

MRI CERVICAL SPINE FINDINGS

Alignment: Mild straightening of the normal cervical lordosis. Trace
retrolisthesis of C5 on C6.

Vertebrae: Vertebral body heights maintained without evidence for
acute or chronic fracture. Bone marrow signal intensity within
normal limits. No discrete or worrisome osseous lesions. The mild
reactive edema about the left C2-3 facet due to facet arthritis
(series 4, image 15).

Cord: Signal intensity within the cervical spinal cord is normal.

Posterior Fossa, vertebral arteries, paraspinal tissues:
Craniocervical junction normal. Paraspinous and prevertebral soft
tissues normal. Normal intravascular flow voids present within the
vertebral arteries bilaterally.

Disc levels:

C2-C3: No significant disc bulge. Advanced left-sided facet
degeneration with associated reactive edema. Asymmetric perfusion
within the left C2-3 facet. Resultant mild left C3 foraminal
stenosis. No significant canal or right foraminal encroachment.

C3-C4: Mild diffuse disc bulge. Right-sided uncovertebral
hypertrophy. No significant canal stenosis. Moderate right C4
foraminal narrowing. No significant left foraminal encroachment.

C4-C5: Mild diffuse disc bulge. Bilateral uncovertebral hypertrophy,
right greater than left. Bulging disc mildly flattens the ventral
thecal sac without significant spinal stenosis. Moderate right with
mild left C5 foraminal narrowing.

C5-C6: Diffuse circumferential disc osteophyte complex. Bilateral
uncovertebral spurring, left greater than right. Broad posterior
component flattens and partially faces the ventral degenerative
results in mild spinal stenosis. Superimposed mild ligamentum flavum
thickening. No significant cord deformity. Severe left with moderate
right C6 foraminal narrowing.

C6-C7: Diffuse disc bulge with bilateral uncovertebral hypertrophy.
Bulging disc mildly indents the ventral thecal sac with resultant
fairly mild spinal stenosis. Mild to moderate bilateral C7 foraminal
stenosis, slightly worse on the left.

C7-T1: Mild bilateral facet hypertrophy, slightly worse on the
right. No significant canal or foraminal stenosis.

Visualized upper thoracic spine within normal limits.
IMPRESSION: MRI HEAD IMPRESSION:

1. Punctate 4 mm acute ischemic nonhemorrhagic small vessel type
infarct involving the subcortical white matter of the left parietal
lobe.
2. No other acute intracranial abnormality.
3. Moderate chronic small vessel ischemic disease, stable.
4. Multiple scattered chronic micro hemorrhages throughout the
brain, similar to previous, again favored to reflect the sequelae of
chronic hypertension.

MRI CERVICAL SPINE IMPRESSION:

1. No acute abnormality within the cervical spine.
2. Mild cervical spondylolysis with resultant mild spinal stenosis
at C5-6 and C6-7.
3. Multifactorial degenerative changes with resultant multilevel
foraminal narrowing as above. Notable findings include moderate
right C4, C5, and C6 foraminal stenosis, severe left C6 foraminal
narrowing, with mild to moderate bilateral C7 foraminal stenosis.
4. Reactive edema about the left C2-3 facet due to facet arthritis.
Finding could serve as a source for neck pain.

## 2018-07-24 IMAGING — MR MR CERVICAL SPINE W/O CM
4 of 5 series · 16 of 48 positions shown · non-contrast
Comparison: Prior CT from earlier the same day as well as recent
MRI from 02/28/2017.

CLINICAL DATA: Initial evaluation for slowly progressive ataxia 4
months.

EXAM:
MRI HEAD WITHOUT CONTRAST
MRI CERVICAL SPINE WITHOUT CONTRAST
TECHNIQUE: Multiplanar, multiecho pulse sequences of the brain and surrounding
structures, and cervical spine, to include the craniocervical
junction and cervicothoracic junction, were obtained without
intravenous contrast.

[Series 2: T2 · sagittal · 3.0mm · 0.43mm/px · 6 of 16 slices shown (1 of 2)]
[im 1/16]
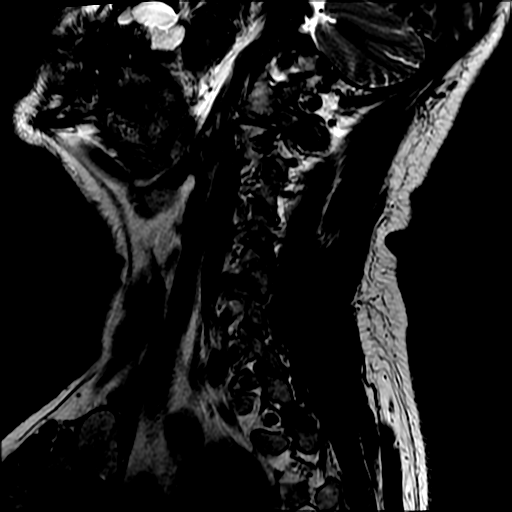
[im 4/16]
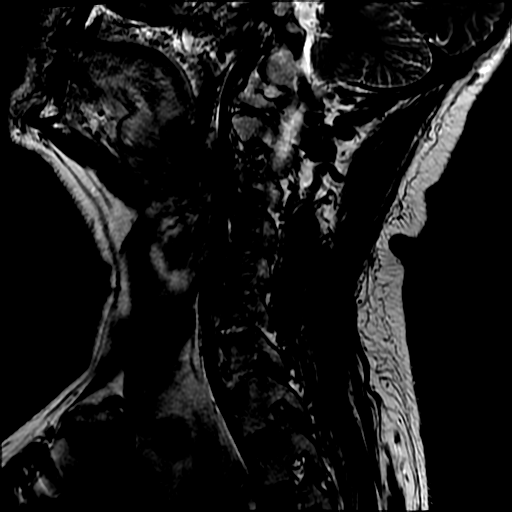
[im 7/16]
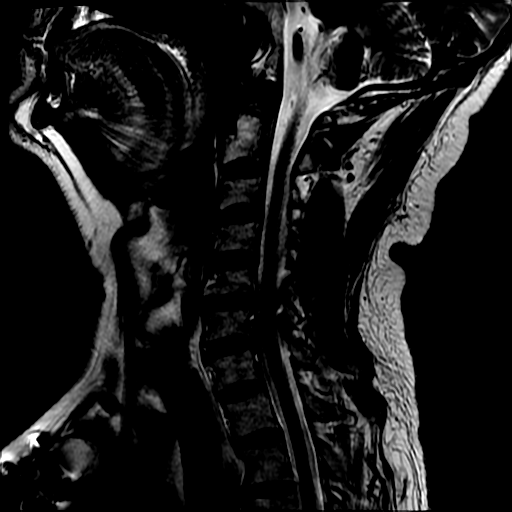
[im 10/16]
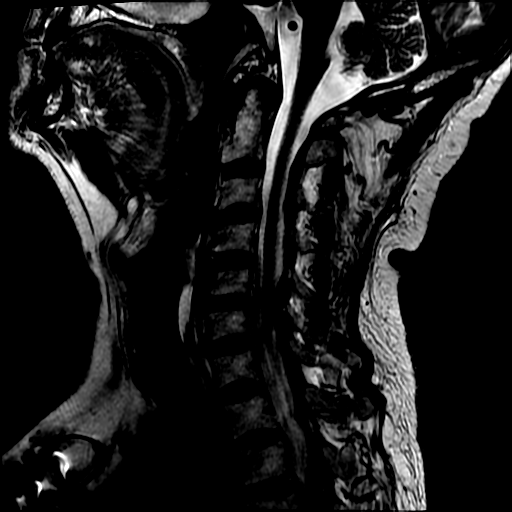
[im 13/16]
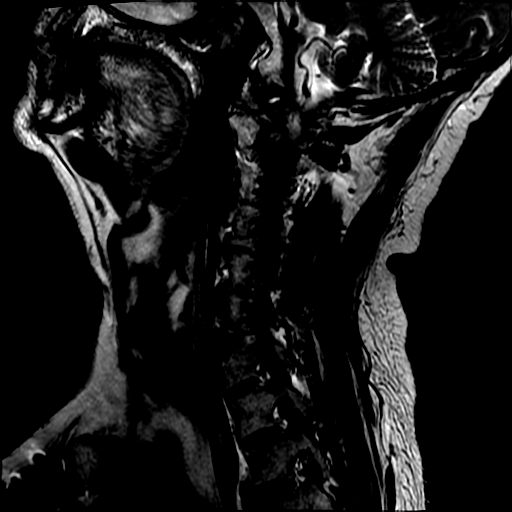
[im 16/16]
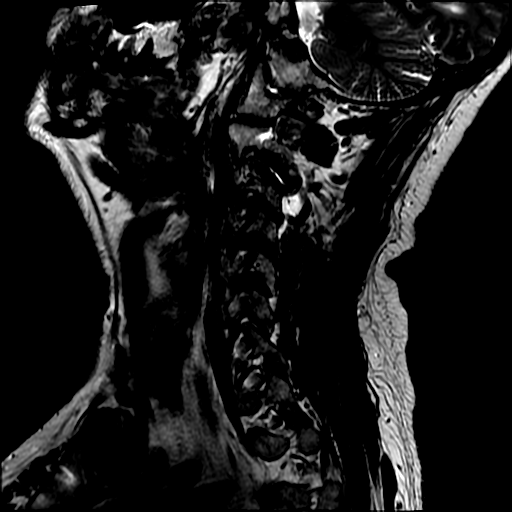

[Series 3: FLAIR · sagittal · 3.0mm · 0.43mm/px · 3 of 16 slices shown]
[im 4/16]
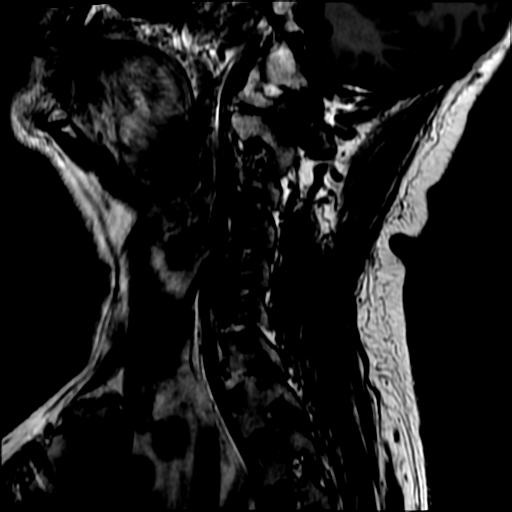
[im 10/16]
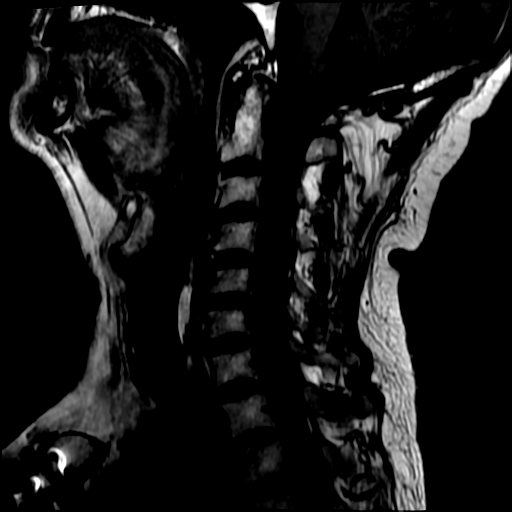
[im 16/16]
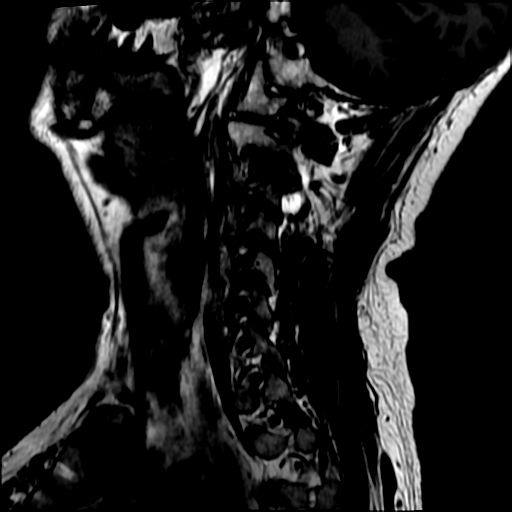

[Series 4: STIR · sagittal · 3.0mm · 0.43mm/px · 3 of 16 slices shown]
[im 4/16]
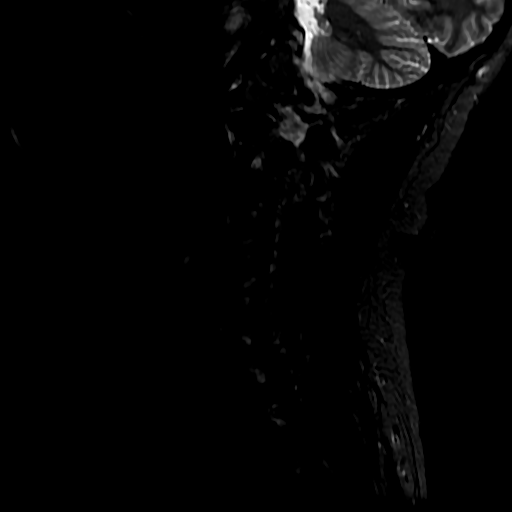
[im 10/16]
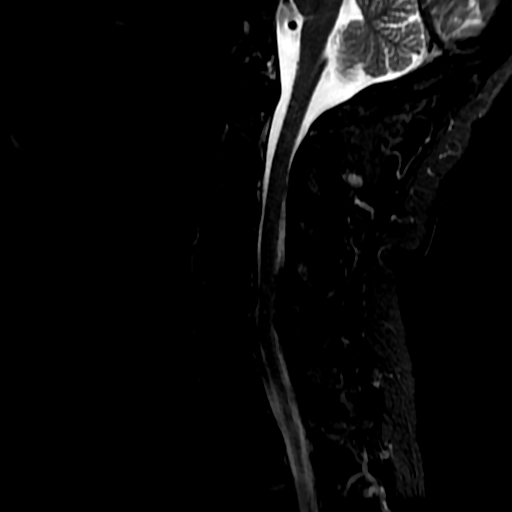
[im 16/16]
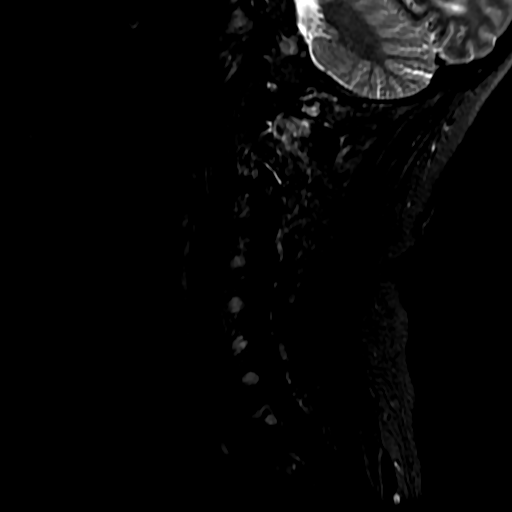

[Series 7: T2 · axial · 3.0mm · 0.35mm/px · z∈[-121,-14]mm · 4 of 40 slices shown (2 of 2)]
[im 1/40]
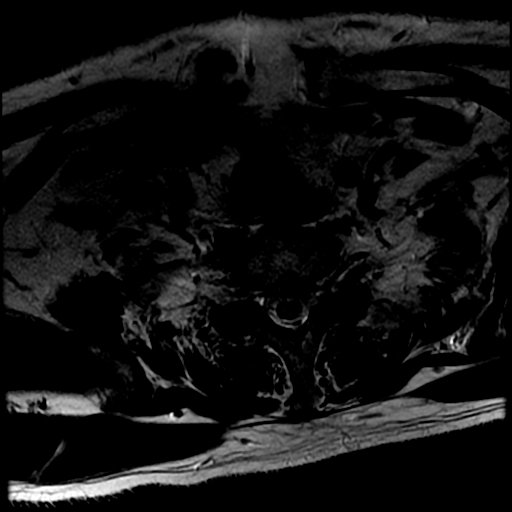
[im 6/40]
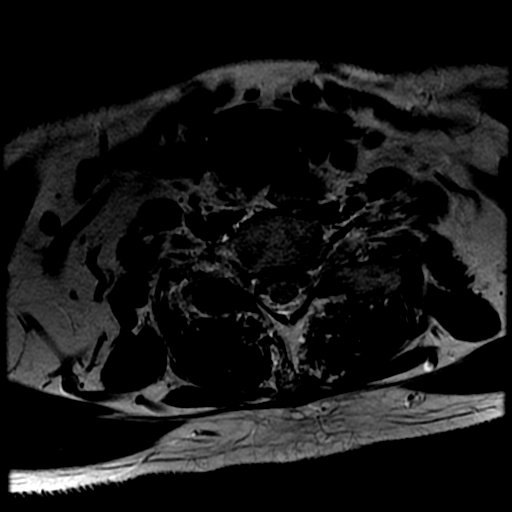
[im 20/40]
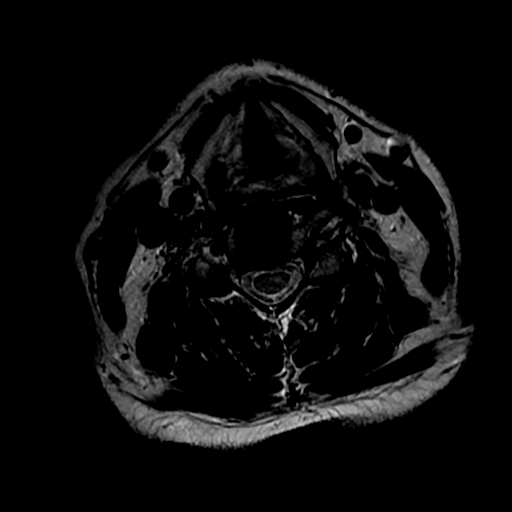
[im 34/40]
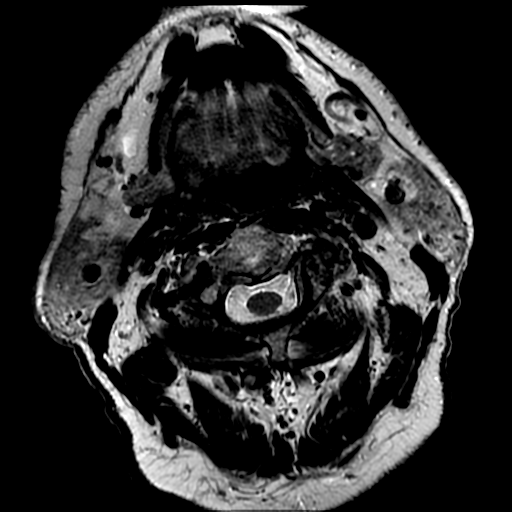

[16 of 48 positions shown; findings below may reference images not displayed]

FINDINGS: MRI HEAD FINDINGS

Brain: Generalized age related cerebral atrophy. Patchy and
confluent T2/FLAIR hyperintensity involving the periventricular deep
white matter both cerebral hemispheres, most consistent with chronic
small vessel ischemic disease, moderate nature.

Punctate 4 mm focus of diffusion abnormality seen involving the
subcortical white matter of the left parietal lobe (series 3, image
31). Corresponding signal loss seen on ADC map (series 350, image
31). This is also seen on coronal DWI sequence (series 5, image 8).
Finding is new relative to most recent MRI, and suspicious for a
tiny acute small vessel type infarct. No associated hemorrhage.

No other evidence for acute or subacute ischemia. Gray-white matter
differentiation otherwise maintained. No other evidence for chronic
infarction. No acute intracranial hemorrhage. Multiple subcentimeter
foci of susceptibility artifact seen throughout the brain, primarily
involving the cerebellum and deep gray nuclei, most consistent with
small chronic micro hemorrhages. Findings are suspected to be
related to underlying chronic hypertension.

No mass lesion, midline shift or mass effect. No hydrocephalus. No
extra-axial fluid collection. Major dural sinuses are grossly
patent.

Pituitary gland suprasellar region within normal limits. Midline
structures intact and normal.

Vascular: Major intracranial vascular flow voids are maintained.

Skull and upper cervical spine: Craniocervical junction normal. Bone
marrow signal intensity within normal limits. No scalp soft tissue
abnormality.

Sinuses/Orbits: Globes and orbital soft tissues within normal
limits. Mild scattered mucosal thickening within the ethmoidal air
cells and right maxillary sinus. Right maxillary sinus retention
cyst partially visualized. Paranasal sinuses are otherwise clear. No
mastoid effusion. Inner ear structures normal.

Other: None.

MRI CERVICAL SPINE FINDINGS

Alignment: Mild straightening of the normal cervical lordosis. Trace
retrolisthesis of C5 on C6.

Vertebrae: Vertebral body heights maintained without evidence for
acute or chronic fracture. Bone marrow signal intensity within
normal limits. No discrete or worrisome osseous lesions. The mild
reactive edema about the left C2-3 facet due to facet arthritis
(series 4, image 15).

Cord: Signal intensity within the cervical spinal cord is normal.

Posterior Fossa, vertebral arteries, paraspinal tissues:
Craniocervical junction normal. Paraspinous and prevertebral soft
tissues normal. Normal intravascular flow voids present within the
vertebral arteries bilaterally.

Disc levels:

C2-C3: No significant disc bulge. Advanced left-sided facet
degeneration with associated reactive edema. Asymmetric perfusion
within the left C2-3 facet. Resultant mild left C3 foraminal
stenosis. No significant canal or right foraminal encroachment.

C3-C4: Mild diffuse disc bulge. Right-sided uncovertebral
hypertrophy. No significant canal stenosis. Moderate right C4
foraminal narrowing. No significant left foraminal encroachment.

C4-C5: Mild diffuse disc bulge. Bilateral uncovertebral hypertrophy,
right greater than left. Bulging disc mildly flattens the ventral
thecal sac without significant spinal stenosis. Moderate right with
mild left C5 foraminal narrowing.

C5-C6: Diffuse circumferential disc osteophyte complex. Bilateral
uncovertebral spurring, left greater than right. Broad posterior
component flattens and partially faces the ventral degenerative
results in mild spinal stenosis. Superimposed mild ligamentum flavum
thickening. No significant cord deformity. Severe left with moderate
right C6 foraminal narrowing.

C6-C7: Diffuse disc bulge with bilateral uncovertebral hypertrophy.
Bulging disc mildly indents the ventral thecal sac with resultant
fairly mild spinal stenosis. Mild to moderate bilateral C7 foraminal
stenosis, slightly worse on the left.

C7-T1: Mild bilateral facet hypertrophy, slightly worse on the
right. No significant canal or foraminal stenosis.

Visualized upper thoracic spine within normal limits.
IMPRESSION: MRI HEAD IMPRESSION:

1. Punctate 4 mm acute ischemic nonhemorrhagic small vessel type
infarct involving the subcortical white matter of the left parietal
lobe.
2. No other acute intracranial abnormality.
3. Moderate chronic small vessel ischemic disease, stable.
4. Multiple scattered chronic micro hemorrhages throughout the
brain, similar to previous, again favored to reflect the sequelae of
chronic hypertension.

MRI CERVICAL SPINE IMPRESSION:

1. No acute abnormality within the cervical spine.
2. Mild cervical spondylolysis with resultant mild spinal stenosis
at C5-6 and C6-7.
3. Multifactorial degenerative changes with resultant multilevel
foraminal narrowing as above. Notable findings include moderate
right C4, C5, and C6 foraminal stenosis, severe left C6 foraminal
narrowing, with mild to moderate bilateral C7 foraminal stenosis.
4. Reactive edema about the left C2-3 facet due to facet arthritis.
Finding could serve as a source for neck pain.

## 2018-08-05 ENCOUNTER — Other Ambulatory Visit: Payer: Self-pay | Admitting: Cardiovascular Disease

## 2018-08-05 ENCOUNTER — Other Ambulatory Visit: Payer: Self-pay

## 2018-08-05 MED ORDER — RAMIPRIL 5 MG PO CAPS: 5.0000 mg | ORAL_CAPSULE | Freq: Two times a day (BID) | ORAL | 2 refills | Status: DC

## 2018-10-16 DIAGNOSIS — I25810 Atherosclerosis of coronary artery bypass graft(s) without angina pectoris: Secondary | ICD-10-CM | POA: Diagnosis not present

## 2018-10-16 DIAGNOSIS — E7800 Pure hypercholesterolemia, unspecified: Secondary | ICD-10-CM | POA: Diagnosis not present

## 2018-10-16 DIAGNOSIS — E663 Overweight: Secondary | ICD-10-CM | POA: Diagnosis not present

## 2018-10-16 DIAGNOSIS — E1151 Type 2 diabetes mellitus with diabetic peripheral angiopathy without gangrene: Secondary | ICD-10-CM | POA: Diagnosis not present

## 2018-10-16 DIAGNOSIS — I10 Essential (primary) hypertension: Secondary | ICD-10-CM | POA: Diagnosis not present

## 2018-10-16 DIAGNOSIS — E1165 Type 2 diabetes mellitus with hyperglycemia: Secondary | ICD-10-CM | POA: Diagnosis not present

## 2018-10-16 DIAGNOSIS — I679 Cerebrovascular disease, unspecified: Secondary | ICD-10-CM | POA: Diagnosis not present

## 2018-10-29 DIAGNOSIS — E1129 Type 2 diabetes mellitus with other diabetic kidney complication: Secondary | ICD-10-CM | POA: Diagnosis not present

## 2018-10-29 DIAGNOSIS — E1165 Type 2 diabetes mellitus with hyperglycemia: Secondary | ICD-10-CM | POA: Diagnosis not present

## 2018-10-29 DIAGNOSIS — N183 Chronic kidney disease, stage 3 unspecified: Secondary | ICD-10-CM | POA: Insufficient documentation

## 2018-10-29 DIAGNOSIS — E1121 Type 2 diabetes mellitus with diabetic nephropathy: Secondary | ICD-10-CM | POA: Insufficient documentation

## 2018-12-11 DIAGNOSIS — I25810 Atherosclerosis of coronary artery bypass graft(s) without angina pectoris: Secondary | ICD-10-CM | POA: Diagnosis not present

## 2018-12-11 DIAGNOSIS — N183 Chronic kidney disease, stage 3 (moderate): Secondary | ICD-10-CM | POA: Diagnosis not present

## 2018-12-11 DIAGNOSIS — I10 Essential (primary) hypertension: Secondary | ICD-10-CM | POA: Diagnosis not present

## 2018-12-11 DIAGNOSIS — E1129 Type 2 diabetes mellitus with other diabetic kidney complication: Secondary | ICD-10-CM | POA: Diagnosis not present

## 2018-12-12 DIAGNOSIS — Z23 Encounter for immunization: Secondary | ICD-10-CM | POA: Diagnosis not present

## 2018-12-16 ENCOUNTER — Other Ambulatory Visit: Payer: Self-pay | Admitting: Cardiovascular Disease

## 2018-12-25 ENCOUNTER — Telehealth: Payer: Self-pay | Admitting: Neurology

## 2018-12-25 NOTE — Telephone Encounter (Signed)
Wakes in the middle of the night being confused, he gets frustrated quickly, no focal deficits, difficulty speaking, difficulty with gait, focal weakness, sensory changes, imbalance or falls. No stroke-like symptoms. I recommended following up with primary care, he was last seen about 4 months ago, changes are not recent.

## 2018-12-25 NOTE — Telephone Encounter (Signed)
Patient's daughter Geralynn Rile, on DPR calling for advice. Patient is showing some symptoms that he previously had after his stroke. He is waking in the middle of the night confused & has had some personality changes. Please call her at [phone]

## 2019-01-10 DIAGNOSIS — E1165 Type 2 diabetes mellitus with hyperglycemia: Secondary | ICD-10-CM | POA: Diagnosis not present

## 2019-01-10 DIAGNOSIS — Z125 Encounter for screening for malignant neoplasm of prostate: Secondary | ICD-10-CM | POA: Diagnosis not present

## 2019-01-10 DIAGNOSIS — E7800 Pure hypercholesterolemia, unspecified: Secondary | ICD-10-CM | POA: Diagnosis not present

## 2019-01-17 DIAGNOSIS — I679 Cerebrovascular disease, unspecified: Secondary | ICD-10-CM | POA: Diagnosis not present

## 2019-01-17 DIAGNOSIS — I25810 Atherosclerosis of coronary artery bypass graft(s) without angina pectoris: Secondary | ICD-10-CM | POA: Diagnosis not present

## 2019-01-17 DIAGNOSIS — E1129 Type 2 diabetes mellitus with other diabetic kidney complication: Secondary | ICD-10-CM | POA: Diagnosis not present

## 2019-01-17 DIAGNOSIS — E663 Overweight: Secondary | ICD-10-CM | POA: Diagnosis not present

## 2019-01-17 DIAGNOSIS — E1151 Type 2 diabetes mellitus with diabetic peripheral angiopathy without gangrene: Secondary | ICD-10-CM | POA: Diagnosis not present

## 2019-01-17 DIAGNOSIS — I1310 Hypertensive heart and chronic kidney disease without heart failure, with stage 1 through stage 4 chronic kidney disease, or unspecified chronic kidney disease: Secondary | ICD-10-CM | POA: Diagnosis not present

## 2019-01-17 DIAGNOSIS — N1832 Chronic kidney disease, stage 3b: Secondary | ICD-10-CM | POA: Diagnosis not present

## 2019-01-17 DIAGNOSIS — E7800 Pure hypercholesterolemia, unspecified: Secondary | ICD-10-CM | POA: Diagnosis not present

## 2019-01-17 DIAGNOSIS — R82998 Other abnormal findings in urine: Secondary | ICD-10-CM | POA: Diagnosis not present

## 2019-01-17 DIAGNOSIS — Z1339 Encounter for screening examination for other mental health and behavioral disorders: Secondary | ICD-10-CM | POA: Diagnosis not present

## 2019-01-17 DIAGNOSIS — Z0000 Encounter for general adult medical examination without abnormal findings: Secondary | ICD-10-CM | POA: Diagnosis not present

## 2019-01-21 DIAGNOSIS — Z1212 Encounter for screening for malignant neoplasm of rectum: Secondary | ICD-10-CM | POA: Diagnosis not present

## 2019-04-22 ENCOUNTER — Ambulatory Visit (INDEPENDENT_AMBULATORY_CARE_PROVIDER_SITE_OTHER): Payer: Medicare Other | Admitting: Cardiovascular Disease

## 2019-04-22 ENCOUNTER — Encounter: Payer: Self-pay | Admitting: Cardiovascular Disease

## 2019-04-22 ENCOUNTER — Other Ambulatory Visit: Payer: Self-pay

## 2019-04-22 VITALS — BP 118/72 | HR 65 | Temp 95.5°F | Ht 72.0 in | Wt 182.0 lb

## 2019-04-22 DIAGNOSIS — Z951 Presence of aortocoronary bypass graft: Secondary | ICD-10-CM

## 2019-04-22 DIAGNOSIS — I4510 Unspecified right bundle-branch block: Secondary | ICD-10-CM

## 2019-04-22 DIAGNOSIS — I10 Essential (primary) hypertension: Secondary | ICD-10-CM

## 2019-04-22 DIAGNOSIS — E785 Hyperlipidemia, unspecified: Secondary | ICD-10-CM

## 2019-04-22 NOTE — Assessment & Plan Note (Signed)
History of hyperlipidemia on statin therapy with lipid profile performed 01/10/2019 revealed a total cholesterol of 50, LDL of 81 and HDL 39.

## 2019-04-22 NOTE — Patient Instructions (Signed)
Medication Instructions:  Your physician recommends that you continue on your current medications as directed. Please refer to the Current Medication list given to you today.  If you need a refill on your cardiac medications before your next appointment, please call your pharmacy.   Lab work: NONE  Testing/Procedures: NONE  Follow-Up: At [facility], you and your health needs are our priority.  As part of our continuing mission to provide you with exceptional heart care, we have created designated Provider Care Teams.  These Care Teams include your primary Cardiologist (physician) and Advanced Practice Providers (APPs -  Physician Assistants and Nurse Practitioners) who all work together to provide you with the care you need, when you need it. You may see Dr. Berry or one of the following Advanced Practice Providers on your designated Care Team:    Luke Kilroy, PA-C  Callie Goodrich, PA-C  Jesse Cleaver, FNP  Your physician wants you to follow-up in: 1 year with Dr. Berry       

## 2019-04-22 NOTE — Progress Notes (Signed)
04/22/2019 Joel Gonzales   1946-02-25  [id number]  Primary Physician Tisovec, Fransico Him, MD Primary Cardiologist: Lorretta Harp MD FACP, [place], [place], [place]  HPI:  Curlie Koza. is a 74 y.o.  mildly overweight Caucasian male, father of 1 child, who I last saw in the office 03/21/2017. He has a history of CAD status post coronary artery bypass surgery x6 in February 2001. He had a LIMA to his LAD, a RIMA to his PDA, right radial to the first obtuse marginal branch, diagonal branch, second marginal branch, and posterolateral branches. His other problems include treated hypertension, dyslipidemia, and chronic right bundle branch block. He denies chest pain or shortness of breath. Dr. Nicki Reaper has checked his lipid profile in the past. He had a Myoview stress test performed October 31, which was entirely normal. Since I saw him year ago he's been completely asymptomatic. He had a Myoview stress test performed in the office 05/15/14 which is entirely normal as was a 2-D echocardiogram.  He was admitted to the hospital with stroke type symptoms 03/04/17 and was seen by neurology. An MRI that showed a 4 mm punctate focus involving the subcortical white matter of the left parietal lobe. A 2-D echo was normal. He is currently wearing an event monitor. He complains of being unsteady on his feet with a shuffling gait and saw a gait disorder neurologist at [facility] .  Since I saw Mr. Overgaard a year ago he continues to do well.  He denies chest pain or shortness of breath.   Current Meds  Medication Sig  . atorvastatin (LIPITOR) 40 MG tablet Take 1 tablet (40 mg total) by mouth daily.  . clopidogrel (PLAVIX) 75 MG tablet Take 1 tablet (75 mg total) by mouth daily.  . Empagliflozin-metFORMIN HCl ER (SYNJARDY XR) 25-1000 MG TB24 Take 1 tablet by mouth daily.  . metoprolol succinate (TOPROL-XL) 50 MG 24 hr tablet TAKE 1 TABLET WITH OR IMMEDIATELY FOLLOWING A MEAL ONCE A DAY.  . pantoprazole (PROTONIX) 40 MG  tablet Take 1 tablet (40 mg total) by mouth daily.  . pioglitazone (ACTOS) 15 MG tablet Take 15 mg by mouth daily.  . ramipril (ALTACE) 5 MG capsule TAKE 1 CAPSULE BY MOUTH TWICE DAILY  . spironolactone-hydrochlorothiazide (ALDACTAZIDE) 25-25 MG tablet Take 0.5 tablets by mouth daily.     No Known Allergies  Social History   Socioeconomic History  . Marital status: Married    Spouse name: Not on file  . Number of children: 1  . Years of education: Not on file  . Highest education level: Bachelor's degree (e.g., BA, AB, BS)  Occupational History    Comment: [employer]  Tobacco Use  . Smoking status: Never Smoker  . Smokeless tobacco: Never Used  Substance and Sexual Activity  . Alcohol use: Yes    Comment: minor  . Drug use: No  . Sexual activity: Not on file  Other Topics Concern  . Not on file  Social History Narrative   Lives at home with his wife   Right handed   Drinks at least 1 cup daily of caffeine   Social Determinants of Health   Financial Resource Strain:   . Difficulty of Paying Living Expenses: Not on file  Food Insecurity:   . Worried About Charity fundraiser in the Last Year: Not on file  . Ran Out of Food in the Last Year: Not on file  Transportation Needs:   .  Lack of Transportation (Medical): Not on file  . Lack of Transportation (Non-Medical): Not on file  Physical Activity:   . Days of Exercise per Week: Not on file  . Minutes of Exercise per Session: Not on file  Stress:   . Feeling of Stress : Not on file  Social Connections:   . Frequency of Communication with Friends and Family: Not on file  . Frequency of Social Gatherings with Friends and Family: Not on file  . Attends Religious Services: Not on file  . Active Member of Clubs or Organizations: Not on file  . Attends Archivist Meetings: Not on file  . Marital Status: Not on file  Intimate Partner Violence:   . Fear of Current or Ex-Partner: Not on file  .  Emotionally Abused: Not on file  . Physically Abused: Not on file  . Sexually Abused: Not on file     Review of Systems: General: negative for chills, fever, night sweats or weight changes.  Cardiovascular: negative for chest pain, dyspnea on exertion, edema, orthopnea, palpitations, paroxysmal nocturnal dyspnea or shortness of breath Dermatological: negative for rash Respiratory: negative for cough or wheezing Urologic: negative for hematuria Abdominal: negative for nausea, vomiting, diarrhea, bright red blood per rectum, melena, or hematemesis Neurologic: negative for visual changes, syncope, or dizziness All other systems reviewed and are otherwise negative except as noted above.    Blood pressure 118/72, pulse 65, temperature (!) 95.5 F (35.3 C), height 6' (1.829 m), weight 182 lb (82.6 kg).  General appearance: alert and no distress Neck: no adenopathy, no carotid bruit, no JVD, supple, symmetrical, trachea midline and thyroid not enlarged, symmetric, no tenderness/mass/nodules Lungs: clear to auscultation bilaterally Heart: regular rate and rhythm, S1, S2 normal, no murmur, click, rub or gallop Extremities: extremities normal, atraumatic, no cyanosis or edema Pulses: 2+ and symmetric Skin: Skin color, texture, turgor normal. No rashes or lesions Neurologic: Alert and oriented X 3, normal strength and tone. Normal symmetric reflexes. Normal coordination and gait  EKG sinus rhythm 65 with right bundle branch block.  I personally reviewed this EKG.  ASSESSMENT AND PLAN:   Essential hypertension History of essential hypertension with blood pressure measured today 118/72.  He is on metoprolol and allopurinol as well as spironolactone and hydrochlorothiazide.  Hyperlipidemia with target LDL less than 130 History of hyperlipidemia on statin therapy with lipid profile performed 01/10/2019 revealed a total cholesterol of 50, LDL of 81 and HDL 39.  Hx of CABG History of CAD status  post CABG times 10 May 1999.  He had a LIMA to his LAD, RIMA to the PDA.  Right radial to the first obtuse marginal branch, diagonal branch, second marginal branch and posterior lateral branches.  His last Myoview performed 05/15/2014 was nonischemic.  He denies chest pain or shortness of breath.  Right bundle branch block Chronic      Lorretta Harp MD [facility], [facility] 04/22/2019 11:23 AM

## 2019-04-22 NOTE — Assessment & Plan Note (Signed)
History of CAD status post CABG times 10 May 1999.  He had a LIMA to his LAD, RIMA to the PDA.  Right radial to the first obtuse marginal branch, diagonal branch, second marginal branch and posterior lateral branches.  His last Myoview performed 05/15/2014 was nonischemic.  He denies chest pain or shortness of breath.

## 2019-04-22 NOTE — Assessment & Plan Note (Signed)
Chronic. 

## 2019-04-22 NOTE — Assessment & Plan Note (Signed)
History of essential hypertension with blood pressure measured today 118/72.  He is on metoprolol and allopurinol as well as spironolactone and hydrochlorothiazide.

## 2019-04-30 DIAGNOSIS — E663 Overweight: Secondary | ICD-10-CM | POA: Diagnosis not present

## 2019-04-30 DIAGNOSIS — I25810 Atherosclerosis of coronary artery bypass graft(s) without angina pectoris: Secondary | ICD-10-CM | POA: Diagnosis not present

## 2019-04-30 DIAGNOSIS — I679 Cerebrovascular disease, unspecified: Secondary | ICD-10-CM | POA: Diagnosis not present

## 2019-04-30 DIAGNOSIS — Z1331 Encounter for screening for depression: Secondary | ICD-10-CM | POA: Diagnosis not present

## 2019-04-30 DIAGNOSIS — E1151 Type 2 diabetes mellitus with diabetic peripheral angiopathy without gangrene: Secondary | ICD-10-CM | POA: Diagnosis not present

## 2019-04-30 DIAGNOSIS — N1832 Chronic kidney disease, stage 3b: Secondary | ICD-10-CM | POA: Diagnosis not present

## 2019-04-30 DIAGNOSIS — E1129 Type 2 diabetes mellitus with other diabetic kidney complication: Secondary | ICD-10-CM | POA: Diagnosis not present

## 2019-04-30 DIAGNOSIS — I1310 Hypertensive heart and chronic kidney disease without heart failure, with stage 1 through stage 4 chronic kidney disease, or unspecified chronic kidney disease: Secondary | ICD-10-CM | POA: Diagnosis not present

## 2019-04-30 DIAGNOSIS — E7800 Pure hypercholesterolemia, unspecified: Secondary | ICD-10-CM | POA: Diagnosis not present

## 2019-05-08 ENCOUNTER — Other Ambulatory Visit: Payer: Self-pay | Admitting: Cardiovascular Disease

## 2019-05-08 NOTE — Telephone Encounter (Signed)
*  STAT* If patient is at the pharmacy, call can be transferred to refill team.   1. Which medications need to be refilled? (please list name of each medication and dose if known) spironolactone-hydrochlorothiazide (ALDACTAZIDE) 25-25 MG tablet  2. Which pharmacy/location (including street and city if local pharmacy) is medication to be sent to? [place], [place]  3. Do they need a 30 day or 90 day supply? 90 day supply

## 2019-07-07 DIAGNOSIS — Z20828 Contact with and (suspected) exposure to other viral communicable diseases: Secondary | ICD-10-CM | POA: Diagnosis not present

## 2019-08-04 DIAGNOSIS — E7800 Pure hypercholesterolemia, unspecified: Secondary | ICD-10-CM | POA: Diagnosis not present

## 2019-08-04 DIAGNOSIS — N1832 Chronic kidney disease, stage 3b: Secondary | ICD-10-CM | POA: Diagnosis not present

## 2019-08-04 DIAGNOSIS — E1151 Type 2 diabetes mellitus with diabetic peripheral angiopathy without gangrene: Secondary | ICD-10-CM | POA: Diagnosis not present

## 2019-08-04 DIAGNOSIS — E1129 Type 2 diabetes mellitus with other diabetic kidney complication: Secondary | ICD-10-CM | POA: Diagnosis not present

## 2019-08-04 DIAGNOSIS — I1310 Hypertensive heart and chronic kidney disease without heart failure, with stage 1 through stage 4 chronic kidney disease, or unspecified chronic kidney disease: Secondary | ICD-10-CM | POA: Diagnosis not present

## 2019-08-04 DIAGNOSIS — I679 Cerebrovascular disease, unspecified: Secondary | ICD-10-CM | POA: Diagnosis not present

## 2019-08-04 DIAGNOSIS — E1165 Type 2 diabetes mellitus with hyperglycemia: Secondary | ICD-10-CM | POA: Diagnosis not present

## 2019-08-04 DIAGNOSIS — I25810 Atherosclerosis of coronary artery bypass graft(s) without angina pectoris: Secondary | ICD-10-CM | POA: Diagnosis not present

## 2019-08-04 DIAGNOSIS — E663 Overweight: Secondary | ICD-10-CM | POA: Diagnosis not present

## 2019-08-11 ENCOUNTER — Other Ambulatory Visit: Payer: Self-pay | Admitting: Cardiovascular Disease

## 2019-08-18 DIAGNOSIS — E1165 Type 2 diabetes mellitus with hyperglycemia: Secondary | ICD-10-CM | POA: Diagnosis not present

## 2019-08-18 DIAGNOSIS — N1832 Chronic kidney disease, stage 3b: Secondary | ICD-10-CM | POA: Diagnosis not present

## 2020-01-15 DIAGNOSIS — E7800 Pure hypercholesterolemia, unspecified: Secondary | ICD-10-CM | POA: Diagnosis not present

## 2020-01-15 DIAGNOSIS — E1129 Type 2 diabetes mellitus with other diabetic kidney complication: Secondary | ICD-10-CM | POA: Diagnosis not present

## 2020-01-15 DIAGNOSIS — Z125 Encounter for screening for malignant neoplasm of prostate: Secondary | ICD-10-CM | POA: Diagnosis not present

## 2020-01-20 DIAGNOSIS — I679 Cerebrovascular disease, unspecified: Secondary | ICD-10-CM | POA: Diagnosis not present

## 2020-01-20 DIAGNOSIS — R82998 Other abnormal findings in urine: Secondary | ICD-10-CM | POA: Diagnosis not present

## 2020-01-20 DIAGNOSIS — E1129 Type 2 diabetes mellitus with other diabetic kidney complication: Secondary | ICD-10-CM | POA: Diagnosis not present

## 2020-01-20 DIAGNOSIS — H269 Unspecified cataract: Secondary | ICD-10-CM | POA: Diagnosis not present

## 2020-01-20 DIAGNOSIS — N1832 Chronic kidney disease, stage 3b: Secondary | ICD-10-CM | POA: Diagnosis not present

## 2020-01-20 DIAGNOSIS — I25810 Atherosclerosis of coronary artery bypass graft(s) without angina pectoris: Secondary | ICD-10-CM | POA: Diagnosis not present

## 2020-01-20 DIAGNOSIS — E1151 Type 2 diabetes mellitus with diabetic peripheral angiopathy without gangrene: Secondary | ICD-10-CM | POA: Diagnosis not present

## 2020-01-20 DIAGNOSIS — E1165 Type 2 diabetes mellitus with hyperglycemia: Secondary | ICD-10-CM | POA: Diagnosis not present

## 2020-01-20 DIAGNOSIS — E663 Overweight: Secondary | ICD-10-CM | POA: Diagnosis not present

## 2020-01-20 DIAGNOSIS — E7800 Pure hypercholesterolemia, unspecified: Secondary | ICD-10-CM | POA: Diagnosis not present

## 2020-01-20 DIAGNOSIS — I1310 Hypertensive heart and chronic kidney disease without heart failure, with stage 1 through stage 4 chronic kidney disease, or unspecified chronic kidney disease: Secondary | ICD-10-CM | POA: Diagnosis not present

## 2020-01-20 DIAGNOSIS — Z0000 Encounter for general adult medical examination without abnormal findings: Secondary | ICD-10-CM | POA: Diagnosis not present

## 2020-01-20 DIAGNOSIS — Z23 Encounter for immunization: Secondary | ICD-10-CM | POA: Diagnosis not present

## 2020-02-20 DIAGNOSIS — D225 Melanocytic nevi of trunk: Secondary | ICD-10-CM | POA: Diagnosis not present

## 2020-02-20 DIAGNOSIS — D0462 Carcinoma in situ of skin of left upper limb, including shoulder: Secondary | ICD-10-CM | POA: Diagnosis not present

## 2020-02-20 DIAGNOSIS — C44329 Squamous cell carcinoma of skin of other parts of face: Secondary | ICD-10-CM | POA: Diagnosis not present

## 2020-02-20 DIAGNOSIS — Z85828 Personal history of other malignant neoplasm of skin: Secondary | ICD-10-CM | POA: Diagnosis not present

## 2020-02-20 DIAGNOSIS — L821 Other seborrheic keratosis: Secondary | ICD-10-CM | POA: Diagnosis not present

## 2020-02-20 DIAGNOSIS — D1801 Hemangioma of skin and subcutaneous tissue: Secondary | ICD-10-CM | POA: Diagnosis not present

## 2020-02-20 DIAGNOSIS — L570 Actinic keratosis: Secondary | ICD-10-CM | POA: Diagnosis not present

## 2020-03-16 DIAGNOSIS — C44319 Basal cell carcinoma of skin of other parts of face: Secondary | ICD-10-CM | POA: Diagnosis not present

## 2020-03-16 DIAGNOSIS — Z85828 Personal history of other malignant neoplasm of skin: Secondary | ICD-10-CM | POA: Diagnosis not present

## 2020-03-18 DIAGNOSIS — Z1212 Encounter for screening for malignant neoplasm of rectum: Secondary | ICD-10-CM | POA: Diagnosis not present

## 2020-03-23 DIAGNOSIS — Z85828 Personal history of other malignant neoplasm of skin: Secondary | ICD-10-CM | POA: Diagnosis not present

## 2020-03-23 DIAGNOSIS — D0462 Carcinoma in situ of skin of left upper limb, including shoulder: Secondary | ICD-10-CM | POA: Diagnosis not present

## 2020-04-27 ENCOUNTER — Ambulatory Visit (INDEPENDENT_AMBULATORY_CARE_PROVIDER_SITE_OTHER): Payer: Medicare Other | Admitting: Cardiovascular Disease

## 2020-04-27 ENCOUNTER — Other Ambulatory Visit: Payer: Self-pay

## 2020-04-27 ENCOUNTER — Encounter: Payer: Self-pay | Admitting: Cardiovascular Disease

## 2020-04-27 VITALS — BP 126/70 | HR 76 | Ht 71.0 in | Wt 194.0 lb

## 2020-04-27 DIAGNOSIS — I10 Essential (primary) hypertension: Secondary | ICD-10-CM | POA: Diagnosis not present

## 2020-04-27 DIAGNOSIS — Z951 Presence of aortocoronary bypass graft: Secondary | ICD-10-CM | POA: Diagnosis not present

## 2020-04-27 DIAGNOSIS — I4510 Unspecified right bundle-branch block: Secondary | ICD-10-CM

## 2020-04-27 DIAGNOSIS — E785 Hyperlipidemia, unspecified: Secondary | ICD-10-CM

## 2020-04-27 LAB — HEPATIC FUNCTION PANEL
ALT: 17 IU/L (ref 0–44)
AST: 22 IU/L (ref 0–40)
Albumin: 4.8 g/dL — ABNORMAL HIGH (ref 3.7–4.7)
Alkaline Phosphatase: 104 IU/L (ref 44–121)
Bilirubin Total: 0.6 mg/dL (ref 0.0–1.2)
Bilirubin, Direct: 0.15 mg/dL (ref 0.00–0.40)
Total Protein: 7.8 g/dL (ref 6.0–8.5)

## 2020-04-27 LAB — LIPID PANEL
Chol/HDL Ratio: 3.6 ratio (ref 0.0–5.0)
Cholesterol, Total: 167 mg/dL (ref 100–199)
HDL: 46 mg/dL (ref >39)
LDL Chol Calc (NIH): 90 mg/dL (ref 0–99)
Triglycerides: 179 mg/dL — ABNORMAL HIGH (ref 0–149)
VLDL Cholesterol Cal: 31 mg/dL (ref 5–40)

## 2020-04-27 NOTE — Progress Notes (Signed)
04/27/2020 Joel Gonzales   09/24/1945  [id number]  Primary Physician Tisovec, Fransico Him, MD Primary Cardiologist: Lorretta Harp MD FACP, [place], [place], [place]  HPI:  Joel Gonzales. is a 75 y.o.   mildly overweight Caucasian male, father of 1 child, who I last sawin the office  04/22/2019.  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  He has a history of CAD status post coronary artery bypass surgery x6 in February 2001. He had a LIMA to his LAD, a RIMA to his PDA, right radial to the first obtuse marginal branch, diagonal branch, second marginal branch, and posterolateral branches. His other problems include treated hypertension, dyslipidemia, and chronic right bundle branch block. He denies chest pain or shortness of breath. Dr. Nicki Reaper has checked his lipid profile in the past. He had a Myoview stress test performed October 31, which was entirely normal. Since I saw him year ago he's been completely asymptomatic. He had a Myoview stress test performed in the office 05/15/14 which is entirely normal as was a 2-D echocardiogram.  He was admitted to the hospital with stroke type symptoms 03/04/17 and was seen by neurology. An MRI that showed a 4 mm punctate focus involving the subcortical white matter of the left parietal lobe. A 2-D echo was normal. He is currently wearing an event monitor. He complains of being unsteady on his feet with a shuffling gait and saw a gait disorder neurologist at [facility] .  Since I saw [name] a year ago he continues to do well.  He denies chest pain or shortness of breath.    Current Meds  Medication Sig  . atorvastatin (LIPITOR) 40 MG tablet Take 1 tablet (40 mg total) by mouth daily.  . clopidogrel (PLAVIX) 75 MG tablet Take 1 tablet (75 mg total) by mouth daily.  . Empagliflozin-metFORMIN HCl ER (SYNJARDY XR) 25-1000 MG TB24 Take 1 tablet by mouth daily.  . metoprolol succinate (TOPROL-XL) 50 MG 24 hr tablet  TAKE 1 TABLET WITH OR IMMEDIATELY FOLLOWING A MEAL ONCE A DAY.  . pantoprazole (PROTONIX) 40 MG tablet Take 1 tablet (40 mg total) by mouth daily.  . pioglitazone (ACTOS) 15 MG tablet Take 15 mg by mouth daily.  . ramipril (ALTACE) 5 MG capsule TAKE 1 CAPSULE BY MOUTH TWICE DAILY  . spironolactone-hydrochlorothiazide (ALDACTAZIDE) 25-25 MG tablet TAKE 1/2 TABLET BY MOUTH DAILY     No Known Allergies  Social History   Socioeconomic History  . Marital status: Married    Spouse name: Not on file  . Number of children: 1  . Years of education: Not on file  . Highest education level: Bachelor's degree (e.g., BA, AB, BS)  Occupational History    Comment: [employer]  Tobacco Use  . Smoking status: Never Smoker  . Smokeless tobacco: Never Used  Vaping Use  . Vaping Use: Never used  Substance and Sexual Activity  . Alcohol use: Yes    Comment: minor  . Drug use: No  . Sexual activity: Not on file  Other Topics Concern  . Not on file  Social History Narrative   Lives at home with his wife   Right handed   Drinks at least 1 cup daily of caffeine   Social Determinants of Health   Financial Resource Strain: Not on file  Food Insecurity: Not on file  Transportation Needs: Not on file  Physical Activity: Not on file  Stress: Not on file  Social Connections: Not on file  Intimate Partner Violence: Not on file     Review of Systems: General: negative for chills, fever, night sweats or weight changes.  Cardiovascular: negative for chest pain, dyspnea on exertion, edema, orthopnea, palpitations, paroxysmal nocturnal dyspnea or shortness of breath Dermatological: negative for rash Respiratory: negative for cough or wheezing Urologic: negative for hematuria Abdominal: negative for nausea, vomiting, diarrhea, bright red blood per rectum, melena, or hematemesis Neurologic: negative for visual changes, syncope, or dizziness All other systems reviewed and are otherwise  negative except as noted above.    Blood pressure 126/70, pulse 76, height 5\' 11"  (1.803 m), weight 194 lb (88 kg).  General appearance: alert and no distress Neck: no adenopathy, no carotid bruit, no JVD, supple, symmetrical, trachea midline and thyroid not enlarged, symmetric, no tenderness/mass/nodules Lungs: clear to auscultation bilaterally Heart: regular rate and rhythm, S1, S2 normal, no murmur, click, rub or gallop Extremities: extremities normal, atraumatic, no cyanosis or edema Pulses: 2+ and symmetric Skin: Skin color, texture, turgor normal. No rashes or lesions Neurologic: Alert and oriented X 3, normal strength and tone. Normal symmetric reflexes. Normal coordination and gait  EKG sinus rhythm at 76 with right bundle branch block.  I personally reviewed this EKG.  ASSESSMENT AND PLAN:   Essential hypertension History of essential hypertension a blood pressure measured today 126/70.  He is on Toprol, ramipril and Aldactone/hydrochlorothiazide.  Hyperlipidemia with target LDL less than 130 History of hyperlipidemia on statin therapy.  We will recheck a lipid liver profile this morning.  Hx of CABG History of CAD status post CABG in 2001 with a negative Myoview in 2016 and normal LV function by echo 2018.  He denies chest pain or shortness of breath.  Right bundle branch block Chronic      Lorretta Harp MD [facility], [facility] 04/27/2020 10:44 AM

## 2020-04-27 NOTE — Assessment & Plan Note (Signed)
Chronic. 

## 2020-04-27 NOTE — Assessment & Plan Note (Signed)
History of CAD status post CABG in 2001 with a negative Myoview in 2016 and normal LV function by echo 2018.  He denies chest pain or shortness of breath.

## 2020-04-27 NOTE — Assessment & Plan Note (Signed)
History of essential hypertension a blood pressure measured today 126/70.  He is on Toprol, ramipril and Aldactone/hydrochlorothiazide.

## 2020-04-27 NOTE — Assessment & Plan Note (Signed)
History of hyperlipidemia on statin therapy.  We will recheck a lipid liver profile this morning. 

## 2020-04-27 NOTE — Patient Instructions (Signed)

## 2020-05-05 ENCOUNTER — Telehealth: Payer: Self-pay

## 2020-05-05 NOTE — Telephone Encounter (Signed)
-----   Message from Lorretta Harp, MD sent at 04/28/2020  6:10 PM EST ----- Not at goal for secondary prevention. Goal LDL <70. Increase Atorva to 80 mg/day and recheck 2 months

## 2020-06-09 ENCOUNTER — Telehealth: Payer: Self-pay

## 2020-06-09 NOTE — Telephone Encounter (Signed)
Unable to leave voicemail, not set up. Called to discuss lipid panel and medication change.

## 2020-06-16 ENCOUNTER — Telehealth: Payer: Self-pay

## 2020-06-16 NOTE — Telephone Encounter (Signed)
Attempted to call pt. Unable to leave VM because mailbox not set up.

## 2020-07-19 ENCOUNTER — Other Ambulatory Visit: Payer: Self-pay | Admitting: Cardiovascular Disease

## 2020-07-19 NOTE — Telephone Encounter (Signed)
Rx has been sent to the pharmacy electronically. ° °

## 2020-07-21 DIAGNOSIS — E7800 Pure hypercholesterolemia, unspecified: Secondary | ICD-10-CM | POA: Diagnosis not present

## 2020-07-21 DIAGNOSIS — E663 Overweight: Secondary | ICD-10-CM | POA: Diagnosis not present

## 2020-07-21 DIAGNOSIS — E1165 Type 2 diabetes mellitus with hyperglycemia: Secondary | ICD-10-CM | POA: Diagnosis not present

## 2020-07-21 DIAGNOSIS — I679 Cerebrovascular disease, unspecified: Secondary | ICD-10-CM | POA: Diagnosis not present

## 2020-07-21 DIAGNOSIS — E1129 Type 2 diabetes mellitus with other diabetic kidney complication: Secondary | ICD-10-CM | POA: Diagnosis not present

## 2020-07-21 DIAGNOSIS — E1151 Type 2 diabetes mellitus with diabetic peripheral angiopathy without gangrene: Secondary | ICD-10-CM | POA: Diagnosis not present

## 2020-07-21 DIAGNOSIS — H269 Unspecified cataract: Secondary | ICD-10-CM | POA: Diagnosis not present

## 2020-07-21 DIAGNOSIS — I1310 Hypertensive heart and chronic kidney disease without heart failure, with stage 1 through stage 4 chronic kidney disease, or unspecified chronic kidney disease: Secondary | ICD-10-CM | POA: Diagnosis not present

## 2020-07-21 DIAGNOSIS — N1832 Chronic kidney disease, stage 3b: Secondary | ICD-10-CM | POA: Diagnosis not present

## 2020-07-21 DIAGNOSIS — I25810 Atherosclerosis of coronary artery bypass graft(s) without angina pectoris: Secondary | ICD-10-CM | POA: Diagnosis not present

## 2020-07-26 ENCOUNTER — Other Ambulatory Visit: Payer: Self-pay | Admitting: Cardiovascular Disease

## 2020-10-11 DIAGNOSIS — H43813 Vitreous degeneration, bilateral: Secondary | ICD-10-CM | POA: Diagnosis not present

## 2020-10-11 DIAGNOSIS — H2513 Age-related nuclear cataract, bilateral: Secondary | ICD-10-CM | POA: Diagnosis not present

## 2020-10-11 DIAGNOSIS — E113291 Type 2 diabetes mellitus with mild nonproliferative diabetic retinopathy without macular edema, right eye: Secondary | ICD-10-CM | POA: Diagnosis not present

## 2020-10-11 DIAGNOSIS — H524 Presbyopia: Secondary | ICD-10-CM | POA: Diagnosis not present

## 2020-10-14 ENCOUNTER — Telehealth: Payer: Self-pay | Admitting: Cardiovascular Disease

## 2020-10-14 NOTE — Telephone Encounter (Signed)
Pt is calling requesting the dr write a prescription so he can do cardiac rehab

## 2020-10-14 NOTE — Telephone Encounter (Signed)
Called patient, he states that he would now like to try the Cardiac Rehab. He would like a referral here. I advised I did not see anything in the note to do this or another referral. I would send a message to Dr.Berry if okay to send him to Cardiac Rehab.  Patient verbalized understanding, thankful for call back.

## 2020-10-18 ENCOUNTER — Other Ambulatory Visit: Payer: Self-pay

## 2020-10-18 NOTE — Telephone Encounter (Signed)
Called patient, advised of message.  Cardiac rehab ordered.  Advised patient to let us know if we needed to send it anywhere.  Patient verbalized understanding.

## 2020-10-18 NOTE — Telephone Encounter (Signed)
Called patient, and advised of message of MD that it was not needed. He just states that he likes that better and it was more of a maintenance thing, he likes going there and likes being monitored.  He would like to go [facility] area. He knows that he will have to pay, but he has to have the doctors okay for him to go here.  I advised I would send back another message and let him know what was suggested.  Thanks!

## 2020-10-19 DIAGNOSIS — E119 Type 2 diabetes mellitus without complications: Secondary | ICD-10-CM | POA: Diagnosis not present

## 2020-10-19 DIAGNOSIS — I10 Essential (primary) hypertension: Secondary | ICD-10-CM | POA: Diagnosis not present

## 2020-10-19 DIAGNOSIS — Z951 Presence of aortocoronary bypass graft: Secondary | ICD-10-CM | POA: Diagnosis not present

## 2020-10-20 DIAGNOSIS — E119 Type 2 diabetes mellitus without complications: Secondary | ICD-10-CM | POA: Diagnosis not present

## 2020-10-20 DIAGNOSIS — I10 Essential (primary) hypertension: Secondary | ICD-10-CM | POA: Diagnosis not present

## 2020-10-20 DIAGNOSIS — Z951 Presence of aortocoronary bypass graft: Secondary | ICD-10-CM | POA: Diagnosis not present

## 2020-10-25 DIAGNOSIS — I10 Essential (primary) hypertension: Secondary | ICD-10-CM | POA: Diagnosis not present

## 2020-10-25 DIAGNOSIS — Z951 Presence of aortocoronary bypass graft: Secondary | ICD-10-CM | POA: Diagnosis not present

## 2020-10-25 DIAGNOSIS — E119 Type 2 diabetes mellitus without complications: Secondary | ICD-10-CM | POA: Diagnosis not present

## 2020-10-27 DIAGNOSIS — I10 Essential (primary) hypertension: Secondary | ICD-10-CM | POA: Diagnosis not present

## 2020-10-27 DIAGNOSIS — E119 Type 2 diabetes mellitus without complications: Secondary | ICD-10-CM | POA: Diagnosis not present

## 2020-10-27 DIAGNOSIS — Z951 Presence of aortocoronary bypass graft: Secondary | ICD-10-CM | POA: Diagnosis not present

## 2020-11-08 DIAGNOSIS — Z951 Presence of aortocoronary bypass graft: Secondary | ICD-10-CM | POA: Diagnosis not present

## 2020-11-08 DIAGNOSIS — E119 Type 2 diabetes mellitus without complications: Secondary | ICD-10-CM | POA: Diagnosis not present

## 2020-11-08 DIAGNOSIS — I10 Essential (primary) hypertension: Secondary | ICD-10-CM | POA: Diagnosis not present

## 2021-01-07 DIAGNOSIS — S0093XA Contusion of unspecified part of head, initial encounter: Secondary | ICD-10-CM | POA: Diagnosis not present

## 2021-01-07 DIAGNOSIS — S2249XA Multiple fractures of ribs, unspecified side, initial encounter for closed fracture: Secondary | ICD-10-CM | POA: Diagnosis not present

## 2021-01-07 DIAGNOSIS — S51012A Laceration without foreign body of left elbow, initial encounter: Secondary | ICD-10-CM | POA: Diagnosis not present

## 2021-01-07 DIAGNOSIS — M47812 Spondylosis without myelopathy or radiculopathy, cervical region: Secondary | ICD-10-CM | POA: Diagnosis not present

## 2021-01-07 DIAGNOSIS — S51812A Laceration without foreign body of left forearm, initial encounter: Secondary | ICD-10-CM | POA: Diagnosis not present

## 2021-01-07 DIAGNOSIS — W1830XA Fall on same level, unspecified, initial encounter: Secondary | ICD-10-CM | POA: Diagnosis not present

## 2021-01-07 DIAGNOSIS — R262 Difficulty in walking, not elsewhere classified: Secondary | ICD-10-CM | POA: Diagnosis not present

## 2021-01-07 DIAGNOSIS — S0990XA Unspecified injury of head, initial encounter: Secondary | ICD-10-CM | POA: Diagnosis not present

## 2021-01-07 DIAGNOSIS — Z7901 Long term (current) use of anticoagulants: Secondary | ICD-10-CM | POA: Diagnosis not present

## 2021-01-07 DIAGNOSIS — S199XXA Unspecified injury of neck, initial encounter: Secondary | ICD-10-CM | POA: Diagnosis not present

## 2021-01-07 DIAGNOSIS — S0191XA Laceration without foreign body of unspecified part of head, initial encounter: Secondary | ICD-10-CM | POA: Diagnosis not present

## 2021-01-07 DIAGNOSIS — Z9181 History of falling: Secondary | ICD-10-CM | POA: Diagnosis not present

## 2021-01-07 DIAGNOSIS — S2241XA Multiple fractures of ribs, right side, initial encounter for closed fracture: Secondary | ICD-10-CM | POA: Diagnosis not present

## 2021-01-07 DIAGNOSIS — M542 Cervicalgia: Secondary | ICD-10-CM | POA: Diagnosis not present

## 2021-01-07 DIAGNOSIS — S59901A Unspecified injury of right elbow, initial encounter: Secondary | ICD-10-CM | POA: Diagnosis not present

## 2021-01-07 DIAGNOSIS — S51011A Laceration without foreign body of right elbow, initial encounter: Secondary | ICD-10-CM | POA: Diagnosis not present

## 2021-01-07 DIAGNOSIS — Z23 Encounter for immunization: Secondary | ICD-10-CM | POA: Diagnosis not present

## 2021-01-08 DIAGNOSIS — M47812 Spondylosis without myelopathy or radiculopathy, cervical region: Secondary | ICD-10-CM | POA: Diagnosis not present

## 2021-01-08 DIAGNOSIS — Z7901 Long term (current) use of anticoagulants: Secondary | ICD-10-CM | POA: Diagnosis not present

## 2021-01-08 DIAGNOSIS — S51011A Laceration without foreign body of right elbow, initial encounter: Secondary | ICD-10-CM | POA: Diagnosis not present

## 2021-01-08 DIAGNOSIS — S2249XA Multiple fractures of ribs, unspecified side, initial encounter for closed fracture: Secondary | ICD-10-CM | POA: Diagnosis not present

## 2021-01-08 DIAGNOSIS — S51012A Laceration without foreign body of left elbow, initial encounter: Secondary | ICD-10-CM | POA: Diagnosis not present

## 2021-01-08 DIAGNOSIS — Y939 Activity, unspecified: Secondary | ICD-10-CM | POA: Diagnosis not present

## 2021-01-08 DIAGNOSIS — Y92009 Unspecified place in unspecified non-institutional (private) residence as the place of occurrence of the external cause: Secondary | ICD-10-CM | POA: Diagnosis not present

## 2021-01-08 DIAGNOSIS — W108XXA Fall (on) (from) other stairs and steps, initial encounter: Secondary | ICD-10-CM | POA: Diagnosis not present

## 2021-01-08 DIAGNOSIS — R262 Difficulty in walking, not elsewhere classified: Secondary | ICD-10-CM | POA: Diagnosis not present

## 2021-01-08 DIAGNOSIS — S0191XA Laceration without foreign body of unspecified part of head, initial encounter: Secondary | ICD-10-CM | POA: Diagnosis not present

## 2021-01-08 DIAGNOSIS — M542 Cervicalgia: Secondary | ICD-10-CM | POA: Diagnosis not present

## 2021-01-08 DIAGNOSIS — S51812A Laceration without foreign body of left forearm, initial encounter: Secondary | ICD-10-CM | POA: Diagnosis not present

## 2021-01-09 DIAGNOSIS — S51812A Laceration without foreign body of left forearm, initial encounter: Secondary | ICD-10-CM | POA: Diagnosis not present

## 2021-01-09 DIAGNOSIS — S2249XA Multiple fractures of ribs, unspecified side, initial encounter for closed fracture: Secondary | ICD-10-CM | POA: Diagnosis not present

## 2021-01-09 DIAGNOSIS — M542 Cervicalgia: Secondary | ICD-10-CM | POA: Diagnosis not present

## 2021-01-09 DIAGNOSIS — Y92009 Unspecified place in unspecified non-institutional (private) residence as the place of occurrence of the external cause: Secondary | ICD-10-CM | POA: Diagnosis not present

## 2021-01-09 DIAGNOSIS — S51011A Laceration without foreign body of right elbow, initial encounter: Secondary | ICD-10-CM | POA: Diagnosis not present

## 2021-01-09 DIAGNOSIS — W108XXA Fall (on) (from) other stairs and steps, initial encounter: Secondary | ICD-10-CM | POA: Diagnosis not present

## 2021-01-09 DIAGNOSIS — S0191XA Laceration without foreign body of unspecified part of head, initial encounter: Secondary | ICD-10-CM | POA: Diagnosis not present

## 2021-01-09 DIAGNOSIS — R262 Difficulty in walking, not elsewhere classified: Secondary | ICD-10-CM | POA: Diagnosis not present

## 2021-01-09 DIAGNOSIS — Z7901 Long term (current) use of anticoagulants: Secondary | ICD-10-CM | POA: Diagnosis not present

## 2021-01-09 DIAGNOSIS — S51012A Laceration without foreign body of left elbow, initial encounter: Secondary | ICD-10-CM | POA: Diagnosis not present

## 2021-01-09 DIAGNOSIS — M47812 Spondylosis without myelopathy or radiculopathy, cervical region: Secondary | ICD-10-CM | POA: Diagnosis not present

## 2021-01-09 DIAGNOSIS — Y939 Activity, unspecified: Secondary | ICD-10-CM | POA: Diagnosis not present

## 2021-02-02 DIAGNOSIS — I1310 Hypertensive heart and chronic kidney disease without heart failure, with stage 1 through stage 4 chronic kidney disease, or unspecified chronic kidney disease: Secondary | ICD-10-CM | POA: Diagnosis not present

## 2021-02-02 DIAGNOSIS — Z125 Encounter for screening for malignant neoplasm of prostate: Secondary | ICD-10-CM | POA: Diagnosis not present

## 2021-02-02 DIAGNOSIS — E1165 Type 2 diabetes mellitus with hyperglycemia: Secondary | ICD-10-CM | POA: Diagnosis not present

## 2021-02-02 DIAGNOSIS — E7800 Pure hypercholesterolemia, unspecified: Secondary | ICD-10-CM | POA: Diagnosis not present

## 2021-02-09 DIAGNOSIS — E113299 Type 2 diabetes mellitus with mild nonproliferative diabetic retinopathy without macular edema, unspecified eye: Secondary | ICD-10-CM | POA: Diagnosis not present

## 2021-02-09 DIAGNOSIS — Z0000 Encounter for general adult medical examination without abnormal findings: Secondary | ICD-10-CM | POA: Diagnosis not present

## 2021-02-09 DIAGNOSIS — E663 Overweight: Secondary | ICD-10-CM | POA: Diagnosis not present

## 2021-02-09 DIAGNOSIS — I1310 Hypertensive heart and chronic kidney disease without heart failure, with stage 1 through stage 4 chronic kidney disease, or unspecified chronic kidney disease: Secondary | ICD-10-CM | POA: Diagnosis not present

## 2021-02-09 DIAGNOSIS — Z1331 Encounter for screening for depression: Secondary | ICD-10-CM | POA: Diagnosis not present

## 2021-02-09 DIAGNOSIS — E7800 Pure hypercholesterolemia, unspecified: Secondary | ICD-10-CM | POA: Diagnosis not present

## 2021-02-09 DIAGNOSIS — Z1212 Encounter for screening for malignant neoplasm of rectum: Secondary | ICD-10-CM | POA: Diagnosis not present

## 2021-02-09 DIAGNOSIS — I679 Cerebrovascular disease, unspecified: Secondary | ICD-10-CM | POA: Diagnosis not present

## 2021-02-09 DIAGNOSIS — R82998 Other abnormal findings in urine: Secondary | ICD-10-CM | POA: Diagnosis not present

## 2021-02-09 DIAGNOSIS — N1832 Chronic kidney disease, stage 3b: Secondary | ICD-10-CM | POA: Diagnosis not present

## 2021-02-09 DIAGNOSIS — Z1339 Encounter for screening examination for other mental health and behavioral disorders: Secondary | ICD-10-CM | POA: Diagnosis not present

## 2021-02-09 DIAGNOSIS — I25810 Atherosclerosis of coronary artery bypass graft(s) without angina pectoris: Secondary | ICD-10-CM | POA: Diagnosis not present

## 2021-02-09 DIAGNOSIS — E1165 Type 2 diabetes mellitus with hyperglycemia: Secondary | ICD-10-CM | POA: Diagnosis not present

## 2021-02-09 DIAGNOSIS — E1151 Type 2 diabetes mellitus with diabetic peripheral angiopathy without gangrene: Secondary | ICD-10-CM | POA: Diagnosis not present

## 2021-02-09 DIAGNOSIS — E1129 Type 2 diabetes mellitus with other diabetic kidney complication: Secondary | ICD-10-CM | POA: Diagnosis not present

## 2021-02-09 DIAGNOSIS — H269 Unspecified cataract: Secondary | ICD-10-CM | POA: Diagnosis not present

## 2021-02-17 ENCOUNTER — Inpatient Hospital Stay (HOSPITAL_COMMUNITY)
Admission: EM | Admit: 2021-02-17 | Discharge: 2021-02-22 | DRG: 522 | Disposition: A | Discharge status: Home Health | Payer: Medicare Other | Attending: Internal Medicine | Admitting: Internal Medicine

## 2021-02-17 ENCOUNTER — Emergency Department (HOSPITAL_COMMUNITY): Payer: Medicare Other

## 2021-02-17 ENCOUNTER — Other Ambulatory Visit: Payer: Self-pay

## 2021-02-17 ENCOUNTER — Encounter (HOSPITAL_COMMUNITY): Payer: Self-pay

## 2021-02-17 DIAGNOSIS — Z8249 Family history of ischemic heart disease and other diseases of the circulatory system: Secondary | ICD-10-CM | POA: Diagnosis not present

## 2021-02-17 DIAGNOSIS — S72091A Other fracture of head and neck of right femur, initial encounter for closed fracture: Principal | ICD-10-CM | POA: Diagnosis present

## 2021-02-17 DIAGNOSIS — Y92008 Other place in unspecified non-institutional (private) residence as the place of occurrence of the external cause: Secondary | ICD-10-CM | POA: Diagnosis not present

## 2021-02-17 DIAGNOSIS — Z79899 Other long term (current) drug therapy: Secondary | ICD-10-CM

## 2021-02-17 DIAGNOSIS — N179 Acute kidney failure, unspecified: Secondary | ICD-10-CM | POA: Diagnosis present

## 2021-02-17 DIAGNOSIS — M7989 Other specified soft tissue disorders: Secondary | ICD-10-CM | POA: Diagnosis not present

## 2021-02-17 DIAGNOSIS — W19XXXA Unspecified fall, initial encounter: Secondary | ICD-10-CM | POA: Diagnosis not present

## 2021-02-17 DIAGNOSIS — N182 Chronic kidney disease, stage 2 (mild): Secondary | ICD-10-CM | POA: Diagnosis present

## 2021-02-17 DIAGNOSIS — Z419 Encounter for procedure for purposes other than remedying health state, unspecified: Secondary | ICD-10-CM

## 2021-02-17 DIAGNOSIS — D62 Acute posthemorrhagic anemia: Secondary | ICD-10-CM | POA: Diagnosis not present

## 2021-02-17 DIAGNOSIS — S72001A Fracture of unspecified part of neck of right femur, initial encounter for closed fracture: Secondary | ICD-10-CM | POA: Diagnosis not present

## 2021-02-17 DIAGNOSIS — S72009A Fracture of unspecified part of neck of unspecified femur, initial encounter for closed fracture: Secondary | ICD-10-CM

## 2021-02-17 DIAGNOSIS — Z7902 Long term (current) use of antithrombotics/antiplatelets: Secondary | ICD-10-CM

## 2021-02-17 DIAGNOSIS — I129 Hypertensive chronic kidney disease with stage 1 through stage 4 chronic kidney disease, or unspecified chronic kidney disease: Secondary | ICD-10-CM | POA: Diagnosis present

## 2021-02-17 DIAGNOSIS — Z96641 Presence of right artificial hip joint: Secondary | ICD-10-CM | POA: Diagnosis not present

## 2021-02-17 DIAGNOSIS — Z9049 Acquired absence of other specified parts of digestive tract: Secondary | ICD-10-CM

## 2021-02-17 DIAGNOSIS — M47812 Spondylosis without myelopathy or radiculopathy, cervical region: Secondary | ICD-10-CM | POA: Diagnosis not present

## 2021-02-17 DIAGNOSIS — E785 Hyperlipidemia, unspecified: Secondary | ICD-10-CM | POA: Diagnosis present

## 2021-02-17 DIAGNOSIS — Y9301 Activity, walking, marching and hiking: Secondary | ICD-10-CM | POA: Diagnosis present

## 2021-02-17 DIAGNOSIS — S199XXA Unspecified injury of neck, initial encounter: Secondary | ICD-10-CM | POA: Diagnosis not present

## 2021-02-17 DIAGNOSIS — I951 Orthostatic hypotension: Secondary | ICD-10-CM | POA: Diagnosis not present

## 2021-02-17 DIAGNOSIS — Z6825 Body mass index (BMI) 25.0-25.9, adult: Secondary | ICD-10-CM | POA: Diagnosis not present

## 2021-02-17 DIAGNOSIS — Z471 Aftercare following joint replacement surgery: Secondary | ICD-10-CM | POA: Diagnosis not present

## 2021-02-17 DIAGNOSIS — E1122 Type 2 diabetes mellitus with diabetic chronic kidney disease: Secondary | ICD-10-CM | POA: Diagnosis present

## 2021-02-17 DIAGNOSIS — S0990XA Unspecified injury of head, initial encounter: Secondary | ICD-10-CM | POA: Diagnosis not present

## 2021-02-17 DIAGNOSIS — W108XXA Fall (on) (from) other stairs and steps, initial encounter: Secondary | ICD-10-CM | POA: Diagnosis present

## 2021-02-17 DIAGNOSIS — Z20822 Contact with and (suspected) exposure to covid-19: Secondary | ICD-10-CM | POA: Diagnosis present

## 2021-02-17 DIAGNOSIS — I10 Essential (primary) hypertension: Secondary | ICD-10-CM | POA: Diagnosis not present

## 2021-02-17 DIAGNOSIS — E1165 Type 2 diabetes mellitus with hyperglycemia: Secondary | ICD-10-CM | POA: Diagnosis present

## 2021-02-17 DIAGNOSIS — Z955 Presence of coronary angioplasty implant and graft: Secondary | ICD-10-CM

## 2021-02-17 DIAGNOSIS — E1169 Type 2 diabetes mellitus with other specified complication: Secondary | ICD-10-CM

## 2021-02-17 DIAGNOSIS — M12551 Traumatic arthropathy, right hip: Secondary | ICD-10-CM | POA: Diagnosis not present

## 2021-02-17 DIAGNOSIS — R531 Weakness: Secondary | ICD-10-CM | POA: Diagnosis not present

## 2021-02-17 DIAGNOSIS — Z951 Presence of aortocoronary bypass graft: Secondary | ICD-10-CM

## 2021-02-17 DIAGNOSIS — I672 Cerebral atherosclerosis: Secondary | ICD-10-CM | POA: Diagnosis not present

## 2021-02-17 DIAGNOSIS — I2510 Atherosclerotic heart disease of native coronary artery without angina pectoris: Secondary | ICD-10-CM | POA: Diagnosis not present

## 2021-02-17 DIAGNOSIS — E440 Moderate protein-calorie malnutrition: Secondary | ICD-10-CM | POA: Diagnosis not present

## 2021-02-17 DIAGNOSIS — E871 Hypo-osmolality and hyponatremia: Secondary | ICD-10-CM | POA: Diagnosis present

## 2021-02-17 DIAGNOSIS — Z7984 Long term (current) use of oral hypoglycemic drugs: Secondary | ICD-10-CM | POA: Diagnosis not present

## 2021-02-17 DIAGNOSIS — I4510 Unspecified right bundle-branch block: Secondary | ICD-10-CM | POA: Diagnosis not present

## 2021-02-17 DIAGNOSIS — M25551 Pain in right hip: Secondary | ICD-10-CM | POA: Diagnosis not present

## 2021-02-17 DIAGNOSIS — S7290XA Unspecified fracture of unspecified femur, initial encounter for closed fracture: Secondary | ICD-10-CM

## 2021-02-17 DIAGNOSIS — S2241XA Multiple fractures of ribs, right side, initial encounter for closed fracture: Secondary | ICD-10-CM | POA: Diagnosis not present

## 2021-02-17 DIAGNOSIS — J984 Other disorders of lung: Secondary | ICD-10-CM | POA: Diagnosis not present

## 2021-02-17 LAB — CBC WITH DIFFERENTIAL/PLATELET
Abs Immature Granulocytes: 0.04 10*3/uL (ref 0.00–0.07)
Basophils Absolute: 0.1 10*3/uL (ref 0.0–0.1)
Basophils Relative: 1 %
Eosinophils Absolute: 0.1 10*3/uL (ref 0.0–0.5)
Eosinophils Relative: 2 %
HCT: 41.4 % (ref 39.0–52.0)
Hemoglobin: 13.8 g/dL (ref 13.0–17.0)
Immature Granulocytes: 1 %
Lymphocytes Relative: 17 %
Lymphs Abs: 1.4 10*3/uL (ref 0.7–4.0)
MCH: 31.1 pg (ref 26.0–34.0)
MCHC: 33.3 g/dL (ref 30.0–36.0)
MCV: 93.2 fL (ref 80.0–100.0)
Monocytes Absolute: 0.7 10*3/uL (ref 0.1–1.0)
Monocytes Relative: 8 %
Neutro Abs: 6 10*3/uL (ref 1.7–7.7)
Neutrophils Relative %: 71 %
Platelets: 305 10*3/uL (ref 150–400)
RBC: 4.44 MIL/uL (ref 4.22–5.81)
RDW: 13.3 % (ref 11.5–15.5)
WBC: 8.4 10*3/uL (ref 4.0–10.5)
nRBC: 0 % (ref 0.0–0.2)

## 2021-02-17 LAB — HEMOGLOBIN A1C
Hgb A1c MFr Bld: 6.9 % — ABNORMAL HIGH (ref 4.8–5.6)
Mean Plasma Glucose: 151.33 mg/dL

## 2021-02-17 LAB — COMPREHENSIVE METABOLIC PANEL
ALT: 21 U/L (ref 0–44)
AST: 20 U/L (ref 15–41)
Albumin: 4.1 g/dL (ref 3.5–5.0)
Alkaline Phosphatase: 119 U/L (ref 38–126)
Anion gap: 13 (ref 5–15)
BUN: 25 mg/dL — ABNORMAL HIGH (ref 8–23)
CO2: 23 mmol/L (ref 22–32)
Calcium: 9.5 mg/dL (ref 8.9–10.3)
Chloride: 96 mmol/L — ABNORMAL LOW (ref 98–111)
Creatinine, Ser: 1.51 mg/dL — ABNORMAL HIGH (ref 0.61–1.24)
GFR, Estimated: 48 mL/min — ABNORMAL LOW (ref >60)
Glucose, Bld: 172 mg/dL — ABNORMAL HIGH (ref 70–99)
Potassium: 3.8 mmol/L (ref 3.5–5.1)
Sodium: 132 mmol/L — ABNORMAL LOW (ref 135–145)
Total Bilirubin: 0.8 mg/dL (ref 0.3–1.2)
Total Protein: 7.2 g/dL (ref 6.5–8.1)

## 2021-02-17 LAB — RESP PANEL BY RT-PCR (FLU A&B, COVID) ARPGX2
Influenza A by PCR: NEGATIVE
Influenza B by PCR: NEGATIVE
SARS Coronavirus 2 by RT PCR: NEGATIVE

## 2021-02-17 LAB — SODIUM, URINE, RANDOM: Sodium, Ur: 57 mmol/L

## 2021-02-17 LAB — GLUCOSE, CAPILLARY: Glucose-Capillary: 150 mg/dL — ABNORMAL HIGH (ref 70–99)

## 2021-02-17 LAB — CREATININE, URINE, RANDOM: Creatinine, Urine: 70.41 mg/dL

## 2021-02-17 MED ORDER — HYDRALAZINE HCL 25 MG PO TABS: 25.0000 mg | ORAL_TABLET | Freq: Four times a day (QID) | ORAL | Status: DC | PRN

## 2021-02-17 MED ORDER — ONDANSETRON HCL 4 MG/2ML IJ SOLN: 4.0000 mg | Freq: Four times a day (QID) | INTRAMUSCULAR | Status: DC | PRN

## 2021-02-17 MED ORDER — OXYCODONE-ACETAMINOPHEN 5-325 MG PO TABS
1.0000 | ORAL_TABLET | Freq: Once | ORAL | Status: AC
  Administered 2021-02-17: 14:00:00 1 via ORAL
  Filled 2021-02-17: qty 1

## 2021-02-17 MED ORDER — PANTOPRAZOLE SODIUM 40 MG PO TBEC
40.0000 mg | DELAYED_RELEASE_TABLET | Freq: Every day | ORAL | Status: DC
  Administered 2021-02-17 – 2021-02-22 (×6): 40 mg via ORAL
  Filled 2021-02-17 (×6): qty 1

## 2021-02-17 MED ORDER — HYDROMORPHONE HCL 1 MG/ML IJ SOLN
0.5000 mg | INTRAMUSCULAR | Status: DC | PRN
  Administered 2021-02-17 – 2021-02-18 (×6): 0.5 mg via INTRAVENOUS
  Filled 2021-02-17 (×5): qty 0.5
  Filled 2021-02-17: qty 1

## 2021-02-17 MED ORDER — ATORVASTATIN CALCIUM 80 MG PO TABS
80.0000 mg | ORAL_TABLET | Freq: Every day | ORAL | Status: DC
  Administered 2021-02-17 – 2021-02-21 (×5): 80 mg via ORAL
  Filled 2021-02-17 (×5): qty 1

## 2021-02-17 MED ORDER — METOPROLOL SUCCINATE ER 50 MG PO TB24
50.0000 mg | ORAL_TABLET | Freq: Every day | ORAL | Status: DC
  Administered 2021-02-18 – 2021-02-22 (×5): 50 mg via ORAL
  Filled 2021-02-17 (×5): qty 1

## 2021-02-17 MED ORDER — OXYCODONE HCL 5 MG PO TABS
5.0000 mg | ORAL_TABLET | ORAL | Status: DC | PRN
  Filled 2021-02-17: qty 2

## 2021-02-17 MED ORDER — ENOXAPARIN SODIUM 40 MG/0.4ML IJ SOSY: 40.0000 mg | PREFILLED_SYRINGE | INTRAMUSCULAR | Status: DC

## 2021-02-17 MED ORDER — BISACODYL 5 MG PO TBEC
5.0000 mg | DELAYED_RELEASE_TABLET | Freq: Every day | ORAL | Status: DC | PRN
  Administered 2021-02-21 – 2021-02-22 (×2): 5 mg via ORAL
  Filled 2021-02-17 (×2): qty 1

## 2021-02-17 MED ORDER — INSULIN ASPART 100 UNIT/ML IJ SOLN
0.0000 [IU] | Freq: Three times a day (TID) | INTRAMUSCULAR | Status: DC
  Administered 2021-02-19 (×2): 3 [IU] via SUBCUTANEOUS
  Administered 2021-02-19: 5 [IU] via SUBCUTANEOUS
  Administered 2021-02-20 (×2): 3 [IU] via SUBCUTANEOUS
  Administered 2021-02-20: 5 [IU] via SUBCUTANEOUS
  Administered 2021-02-21 (×2): 2 [IU] via SUBCUTANEOUS
  Administered 2021-02-21 – 2021-02-22 (×2): 3 [IU] via SUBCUTANEOUS
  Administered 2021-02-22: 2 [IU] via SUBCUTANEOUS

## 2021-02-17 NOTE — ED Notes (Signed)
Pt transported to CT by TRN 

## 2021-02-17 NOTE — ED Provider Notes (Signed)
[facility] EMERGENCY DEPARTMENT Provider Note   CSN: [id number] Arrival date & time: 02/17/21  1142     History Chief Complaint  Patient presents with   level 2 fall on thinners    Joel Gonzales. is a 75 y.o. male with PMH CAD status post CABG, HLD, HTN, DM who presents the emergency department as a level 2 trauma alert for evaluation of a fall.  Positive head strike with no loss of consciousness.  Patient is on Plavix.  Patient states that he was walking down the stairs and slipped on the stairs falling down approximately 3 stairs.  He had immediate onset right hip pain and denies chest pain, shortness of breath, abdominal pain or nausea, vomiting, numbness, tingling or any other systemic symptoms or traumatic symptoms.  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[place])    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   Essential hypertension 02/13/2013   Hyperlipidemia with target LDL less than 130 02/13/2013   Right bundle branch block 02/13/2013    Past Surgical History:  Procedure Laterality Date   CHOLECYSTECTOMY  2002   CORONARY ARTERY BYPASS GRAFT     x6 LIma to his LAD, a RIMA to his PDA, right radial to obtuse marginal branch, diagnonal, second marginal, & posteolateral branches   NOSE SURGERY  02/2013   carcinoma   TONSILLECTOMY     as a child       Family History  Problem Relation Age of Onset   Heart attack  Father 49       deceased, ASCVD   Hypertension Father    Heart disease Father    Heart Problems Mother        ASCVD   Hypertension Mother    Heart attack Mother    Heart attack Sister    Hypertension Brother    Hydrocephalus Brother     Social History   Tobacco Use   Smoking status: Never   Smokeless tobacco: Never  Vaping Use   Vaping Use: Never used  Substance Use Topics   Alcohol use: Yes    Comment: minor   Drug use: No    Home Medications Prior to Admission medications   Medication Sig Start Date End Date Taking? Authorizing Provider  atorvastatin (LIPITOR) 80 MG tablet Take 80 mg by mouth daily. 02/09/21   [provider]  clopidogrel (PLAVIX) 75 MG tablet Take 1 tablet (75 mg total) by mouth daily. 03/06/17   Debbe Odea, MD  Empagliflozin-metFORMIN HCl ER (SYNJARDY XR) 25-1000 MG TB24 Take 1 tablet by mouth daily.    [provider]  metoprolol succinate (TOPROL-XL) 50 MG 24 hr tablet TAKE 1 TABLET WITH OR IMMEDIATELY FOLLOWING A MEAL ONCE A DAY. Patient taking differently: 50 mg daily. 08/02/17   Gwenlyn Found,  Pearletha Forge, MD  pantoprazole (PROTONIX) 40 MG tablet Take 1 tablet (40 mg total) by mouth daily. 02/23/15   Lorretta Harp, MD  pioglitazone (ACTOS) 15 MG tablet Take 15 mg by mouth daily.    [provider]  ramipril (ALTACE) 5 MG capsule TAKE 1 CAPSULE BY MOUTH TWICE DAILY Patient taking differently: Take 5 mg by mouth 2 (two) times daily. 07/26/20   Lorretta Harp, MD  spironolactone-hydrochlorothiazide (ALDACTAZIDE) 25-25 MG tablet TAKE 1/2 TABLET BY MOUTH DAILY Patient taking differently: 0.5 tablets daily. 07/19/20   Lorretta Harp, MD    Allergies    Patient has no known allergies.  Review of Systems   Review of Systems  Constitutional:  Negative for chills and fever.  HENT:  Negative for ear pain and sore throat.   Eyes:  Negative for pain and visual disturbance.  Respiratory:  Negative for cough and shortness of  breath.   Cardiovascular:  Negative for chest pain and palpitations.  Gastrointestinal:  Negative for abdominal pain and vomiting.  Genitourinary:  Negative for dysuria and hematuria.  Musculoskeletal:  Positive for arthralgias. Negative for back pain.  Skin:  Negative for color change and rash.  Neurological:  Negative for seizures and syncope.  All other systems reviewed and are negative.  Physical Exam Updated Vital Signs BP 108/70   Pulse 67   Temp 97.9 F (36.6 C) (Oral)   Resp 18   Ht 5\' 11"  (1.803 m)   Wt 81.6 kg   SpO2 98%   BMI 25.10 kg/m   Physical Exam Vitals and nursing note reviewed.  Constitutional:      General: He is not in acute distress.    Appearance: He is well-developed.  HENT:     Head: Normocephalic and atraumatic.  Eyes:     Conjunctiva/sclera: Conjunctivae normal.  Cardiovascular:     Rate and Rhythm: Normal rate and regular rhythm.     Heart sounds: No murmur heard. Pulmonary:     Effort: Pulmonary effort is normal. No respiratory distress.     Breath sounds: Normal breath sounds.  Abdominal:     Palpations: Abdomen is soft.     Tenderness: There is no abdominal tenderness.  Musculoskeletal:        General: Swelling and tenderness (Right hip) present.     Cervical back: Neck supple.  Skin:    General: Skin is warm and dry.     Capillary Refill: Capillary refill takes less than 2 seconds.  Neurological:     Mental Status: He is alert.     Cranial Nerves: No cranial nerve deficit.     Sensory: No sensory deficit.     Motor: No weakness.  Psychiatric:        Mood and Affect: Mood normal.    ED Results / Procedures / Treatments   Labs (all labs ordered are listed, but only abnormal results are displayed) Labs Reviewed  COMPREHENSIVE METABOLIC PANEL - Abnormal; Notable for the following components:      Result Value   Sodium 132 (*)    Chloride 96 (*)    Glucose, Bld 172 (*)    BUN 25 (*)    Creatinine, Ser 1.51 (*)    GFR,  Estimated 48 (*)    All other components within normal limits  RESP PANEL BY RT-PCR (FLU A&B, COVID) ARPGX2  CBC WITH DIFFERENTIAL/PLATELET    EKG None  Radiology CT Head Wo Contrast  Result Date: 02/17/2021 CLINICAL DATA:  Head trauma, minor. EXAM: CT HEAD WITHOUT CONTRAST TECHNIQUE: Contiguous axial images were obtained from the base of the skull through the vertex without intravenous contrast. COMPARISON:  03/04/2017 FINDINGS: Brain: Mild age related volume loss. Moderate chronic small-vessel ischemic changes of the cerebral hemispheric white matter. No sign of acute infarction, mass lesion, hemorrhage, hydrocephalus or extra-axial collection. Vascular: There is atherosclerotic calcification of the major vessels at the base of the brain. Skull: Negative Sinuses/Orbits: Clear/normal Other: None IMPRESSION: No acute or traumatic finding. Mild generalized atrophy. Moderate chronic small-vessel ischemic changes of the hemispheric white matter. Cerebral Atrophy (ICD10-G31.9). Electronically Signed   By: Nelson Chimes M.D.   On: 02/17/2021 12:32   CT Cervical Spine Wo Contrast  Result Date: 02/17/2021 CLINICAL DATA:  Head and neck trauma. EXAM: CT CERVICAL SPINE WITHOUT CONTRAST TECHNIQUE: Multidetector CT imaging of the cervical spine was performed without intravenous contrast. Multiplanar CT image reconstructions were also generated. COMPARISON:  03/04/2017 FINDINGS: Alignment: Normal Skull base and vertebrae: No regional fracture or focal bone finding. Soft tissues and spinal canal: Negative Disc levels: No significant disc level pathology. No stenosis of the canal. Mild bony foraminal narrowing at C3-4, C4-5 and C5-6. No facet arthropathy on the right. On the left there is facet osteoarthritis at C2-3. Upper chest: Pleural and parenchymal scarring at the left apex. Other: None IMPRESSION: No acute or traumatic finding. Spondylosis with mild bony foraminal narrowing at C3-4, C4-5 and C5-6.  Electronically Signed   By: Nelson Chimes M.D.   On: 02/17/2021 12:34   DG Chest Portable 1 View  Result Date: 02/17/2021 CLINICAL DATA:  Fall EXAM: PORTABLE CHEST 1 VIEW COMPARISON:  None. FINDINGS: Median sternotomy wires and mediastinal surgical clips are noted. The heart is at the upper limits of normal for size. The upper mediastinal contours are prominent, likely exaggerated by AP technique. There is no focal consolidation or pulmonary edema. There is no pleural effusion or pneumothorax. There are mildly displaced fractures of the right fifth through ninth ribs. IMPRESSION: Mildly displaced fractures of the right fifth through ninth ribs. Electronically Signed   By: Valetta Mole M.D.   On: 02/17/2021 12:15   DG Hip Unilat W or Wo Pelvis 2-3 Views Right  Result Date: 02/17/2021 CLINICAL DATA:  Fall, right hip pain EXAM: DG HIP (WITH OR WITHOUT PELVIS) 2-3V RIGHT COMPARISON:  None. FINDINGS: Acute right femoral neck fracture with mild impaction and varus angulation. Femur is externally rotated. Femoroacetabular joint intact without dislocation. Mild joint space narrowing of both hips. Diffuse soft tissue swelling about the hip. IMPRESSION: Acute right femoral neck fracture with mild impaction and varus angulation. Electronically Signed   By: Davina Poke D.O.   On: 02/17/2021 12:13    Procedures .Critical Care Performed by: Teressa Lower, MD Authorized by: Teressa Lower, MD   Critical care provider statement:    Critical care time (minutes):  30   Critical care was necessary to treat or prevent imminent or life-threatening deterioration of the following conditions:  Trauma   Critical care was time spent personally by me on the following activities:  Development of treatment plan with patient or surrogate, discussions with consultants, evaluation of patient's response to treatment, examination of patient, ordering and review of laboratory studies, ordering and review of radiographic  studies, ordering and performing treatments and interventions, pulse oximetry, re-evaluation of patient's condition and review of old charts   Medications Ordered in ED Medications  oxyCODONE-acetaminophen (PERCOCET/ROXICET) 5-325 MG per tablet 1 tablet (has no  administration in time range)    ED Course  I have reviewed the triage vital signs and the nursing notes.  Pertinent labs & imaging results that were available during my care of the patient were reviewed by me and considered in my medical decision making (see chart for details).    MDM Rules/Calculators/A&P                           Patient seen emergency department for a fall on blood thinners and right hip pain as a level 2 trauma.  Primary survey unremarkable.  Secondary survey with tenderness over the right hip.  Laboratory evaluation unremarkable.  Trauma imaging with right femoral neck fracture.  No evidence of intracranial hemorrhage or C-spine injury.  Orthopedics consulted recommending medical admission and ultimate surgical repair tomorrow.  Patient then admitted to medicine. Final Clinical Impression(s) / ED Diagnoses Final diagnoses:  None    Rx / DC Orders ED Discharge Orders     None        Bryceton Hantz, MD 02/17/21 1324

## 2021-02-17 NOTE — ED Triage Notes (Signed)
Pt BIB EMS for a fall on thinners. Pt reports walking down the stairs of his carport when he lost his balance and fell backwards. Pt reports R hip and leg pain. Pt is on Plavix.   VSS w/ EMS.

## 2021-02-17 NOTE — Plan of Care (Signed)
  Problem: Education: Goal: Knowledge of General Education information will improve Description: Including pain rating scale, medication(s)/side effects and non-pharmacologic comfort measures Outcome: Progressing   Problem: Health Behavior/Discharge Planning: Goal: Ability to manage health-related needs will improve Outcome: Progressing   Problem: Pain Managment: Goal: General experience of comfort will improve Outcome: Progressing   

## 2021-02-17 NOTE — ED Notes (Signed)
Pt back in room from CT 

## 2021-02-17 NOTE — ED Notes (Signed)
Trauma Response Nurse Note-  Reason for Call / Reason for Trauma activation:   - L2 fall on Plavix  Initial Focused Assessment (If applicable, or please see trauma documentation):  - GCS 15 - Pupils 3, equal and brisk - c/o R hip pain - no visible external trauma  Interventions:  - R arm PIV 20G started - CMP and CBC drawn - Chest, pelvic and right hip xrays - Head CT and c-spine  Plan of Care as of this note:  - Consulting ortho - Pending admit  Event Summary:   - Pt was walking down stairs into carport and fell backwards hitting the back of his head.  No LOC. He is having right hip pain.  Pt takes Plavix.  Louann TRN [phone]

## 2021-02-17 NOTE — Consult Note (Signed)
Reason for Consult:Right hip fx Referring Physician: Wynetta Fines Time called: 1227 Time at bedside: [name] is an 75 y.o. male.  HPI: Joel Gonzales was carrying some things to the car and tripped over the concrete and he fell. He had immediate right hip pain and could not get up. He was brought to the ED where x-rays showed a right hip fx and orthopedic surgery was consulted. 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  Past Medical History:  Diagnosis Date   CAD (coronary artery disease)    Diabetes mellitus without complication ([place])    Type 2   Family history of ASCVD    History of stress test 01/2012   which was entirely normal   Hx of CABG    x6 LIma to his LAD, a RIMA to his PDA, right radial to obtuse marginal branch, diagnonal, second marginal, & posteolateral branches   Hyperlipemia    Hypertension    RBBB    chronic    Past Surgical History:  Procedure Laterality Date   CHOLECYSTECTOMY  2002   CORONARY ARTERY BYPASS GRAFT     x6 LIma to his LAD, a RIMA to his PDA, right radial to obtuse marginal branch, diagnonal, second marginal, & posteolateral branches   NOSE SURGERY  02/2013   carcinoma   TONSILLECTOMY     as a child    Family History  Problem Relation Age of Onset   Heart attack Father 30       deceased, ASCVD   Hypertension Father    Heart disease Father    Heart Problems Mother        ASCVD   Hypertension Mother    Heart attack Mother    Heart attack Sister    Hypertension Brother    Hydrocephalus Brother     Social History:  reports that he has never smoked. He has never used smokeless tobacco. He reports current alcohol use. He reports that he does not use drugs.  Allergies: No Known Allergies  Medications: I have reviewed the patient's current medications.  Results for orders placed or performed during the hospital encounter of 02/17/21 (from the past 48 hour(s))   Comprehensive metabolic panel     Status: Abnormal   Collection Time: 02/17/21 11:49 AM  Result Value Ref Range   Sodium 132 (L) 135 - 145 mmol/L   Potassium 3.8 3.5 - 5.1 mmol/L   Chloride 96 (L) 98 - 111 mmol/L   CO2 23 22 - 32 mmol/L   Glucose, Bld 172 (H) 70 - 99 mg/dL    Comment: Glucose reference range applies only to samples taken after fasting for at least 8 hours.   BUN 25 (H) 8 - 23 mg/dL   Creatinine, Ser 1.51 (H) 0.61 - 1.24 mg/dL   Calcium 9.5 8.9 - 10.3 mg/dL   Total Protein 7.2 6.5 - 8.1 g/dL   Albumin 4.1 3.5 - 5.0 g/dL   AST 20 15 - 41 U/L   ALT 21 0 - 44 U/L   Alkaline Phosphatase 119 38 - 126 U/L   Total Bilirubin 0.8 0.3 - 1.2 mg/dL   GFR, Estimated 48 (L) >60 mL/min    Comment: (NOTE) Calculated using the CKD-EPI Creatinine Equation (2021)    Anion gap 13 5 - 15    Comment: Performed at [facility] [address] [place], [place] [address]  CBC with Differential     Status: None   Collection Time: 02/17/21 11:49 AM  Result Value Ref Range   WBC 8.4 4.0 - 10.5 K/uL   RBC 4.44 4.22 - 5.81 MIL/uL   Hemoglobin 13.8 13.0 - 17.0 g/dL   HCT 41.4 39.0 - 52.0 %   MCV 93.2 80.0 - 100.0 fL   MCH 31.1 26.0 - 34.0 pg   MCHC 33.3 30.0 - 36.0 g/dL   RDW 13.3 11.5 - 15.5 %   Platelets 305 150 - 400 K/uL   nRBC 0.0 0.0 - 0.2 %   Neutrophils Relative % 71 %   Neutro Abs 6.0 1.7 - 7.7 K/uL   Lymphocytes Relative 17 %   Lymphs Abs 1.4 0.7 - 4.0 K/uL   Monocytes Relative 8 %   Monocytes Absolute 0.7 0.1 - 1.0 K/uL   Eosinophils Relative 2 %   Eosinophils Absolute 0.1 0.0 - 0.5 K/uL   Basophils Relative 1 %   Basophils Absolute 0.1 0.0 - 0.1 K/uL   Immature Granulocytes 1 %   Abs Immature Granulocytes 0.04 0.00 - 0.07 K/uL    Comment: Performed at [facility], [address] [place], [place] [address]  Resp Panel by RT-PCR (Flu A&B, Covid) Nasopharyngeal Swab     Status: None   Collection Time: 02/17/21 12:32 PM   Specimen: Nasopharyngeal Swab;  Nasopharyngeal(NP) swabs in vial transport medium  Result Value Ref Range   SARS Coronavirus 2 by RT PCR NEGATIVE NEGATIVE    Comment: (NOTE) SARS-CoV-2 target nucleic acids are NOT DETECTED.  The SARS-CoV-2 RNA is generally detectable in upper respiratory specimens during the acute phase of infection. The lowest concentration of SARS-CoV-2 viral copies this assay can detect is 138 copies/mL. A negative result does not preclude SARS-Cov-2 infection and should not be used as the sole basis for treatment or other patient management decisions. A negative result may occur with  improper specimen collection/handling, submission of specimen other than nasopharyngeal swab, presence of viral mutation(s) within the areas targeted by this assay, and inadequate number of viral copies(<138 copies/mL). A negative result must be combined with clinical observations, patient history, and epidemiological information. The expected result is Negative.  Fact Sheet for Patients:  EntrepreneurPulse.com.au  Fact Sheet for Healthcare Providers:  IncredibleEmployment.be  This test is no t yet approved or cleared by the [place] FDA and  has been authorized for detection and/or diagnosis of SARS-CoV-2 by FDA under an Emergency Use Authorization (EUA). This EUA will remain  in effect (meaning this test can be used) for the duration of the COVID-19 declaration under Section 564(b)(1) of the Act, 21 U.S.C.section 360bbb-3(b)(1), unless the authorization is terminated  or revoked sooner.       Influenza A by PCR NEGATIVE NEGATIVE   Influenza B by PCR NEGATIVE NEGATIVE    Comment: (NOTE) The Xpert Xpress SARS-CoV-2/FLU/RSV plus assay is intended as an aid in the diagnosis of influenza from Nasopharyngeal swab specimens and should not be used as a sole basis for treatment. Nasal washings and aspirates are unacceptable for Xpert Xpress  SARS-CoV-2/FLU/RSV testing.  Fact Sheet for Patients: EntrepreneurPulse.com.au  Fact Sheet for Healthcare Providers: IncredibleEmployment.be  This test is not yet approved or cleared by the [place] FDA and has been authorized for detection and/or diagnosis of SARS-CoV-2 by FDA under an Emergency Use Authorization (EUA). This EUA will remain in effect (meaning this test can be used) for the duration of the COVID-19 declaration  under Section 564(b)(1) of the Act, 21 U.S.C. section 360bbb-3(b)(1), unless the authorization is terminated or revoked.  Performed at [facility], [place] [address], [place], [place] [address]     CT Head Wo Contrast  Result Date: 02/17/2021 CLINICAL DATA:  Head trauma, minor. EXAM: CT HEAD WITHOUT CONTRAST TECHNIQUE: Contiguous axial images were obtained from the base of the skull through the vertex without intravenous contrast. COMPARISON:  03/04/2017 FINDINGS: Brain: Mild age related volume loss. Moderate chronic small-vessel ischemic changes of the cerebral hemispheric white matter. No sign of acute infarction, mass lesion, hemorrhage, hydrocephalus or extra-axial collection. Vascular: There is atherosclerotic calcification of the major vessels at the base of the brain. Skull: Negative Sinuses/Orbits: Clear/normal Other: None IMPRESSION: No acute or traumatic finding. Mild generalized atrophy. Moderate chronic small-vessel ischemic changes of the hemispheric white matter. Cerebral Atrophy (ICD10-G31.9). Electronically Signed   By: Nelson Chimes M.D.   On: 02/17/2021 12:32   CT Cervical Spine Wo Contrast  Result Date: 02/17/2021 CLINICAL DATA:  Head and neck trauma. EXAM: CT CERVICAL SPINE WITHOUT CONTRAST TECHNIQUE: Multidetector CT imaging of the cervical spine was performed without intravenous contrast. Multiplanar CT image reconstructions were also generated. COMPARISON:  03/04/2017 FINDINGS: Alignment: Normal  Skull base and vertebrae: No regional fracture or focal bone finding. Soft tissues and spinal canal: Negative Disc levels: No significant disc level pathology. No stenosis of the canal. Mild bony foraminal narrowing at C3-4, C4-5 and C5-6. No facet arthropathy on the right. On the left there is facet osteoarthritis at C2-3. Upper chest: Pleural and parenchymal scarring at the left apex. Other: None IMPRESSION: No acute or traumatic finding. Spondylosis with mild bony foraminal narrowing at C3-4, C4-5 and C5-6. Electronically Signed   By: Nelson Chimes M.D.   On: 02/17/2021 12:34   DG Chest Portable 1 View  Result Date: 02/17/2021 CLINICAL DATA:  Fall EXAM: PORTABLE CHEST 1 VIEW COMPARISON:  None. FINDINGS: Median sternotomy wires and mediastinal surgical clips are noted. The heart is at the upper limits of normal for size. The upper mediastinal contours are prominent, likely exaggerated by AP technique. There is no focal consolidation or pulmonary edema. There is no pleural effusion or pneumothorax. There are mildly displaced fractures of the right fifth through ninth ribs. IMPRESSION: Mildly displaced fractures of the right fifth through ninth ribs. Electronically Signed   By: Valetta Mole M.D.   On: 02/17/2021 12:15   DG Hip Unilat W or Wo Pelvis 2-3 Views Right  Result Date: 02/17/2021 CLINICAL DATA:  Fall, right hip pain EXAM: DG HIP (WITH OR WITHOUT PELVIS) 2-3V RIGHT COMPARISON:  None. FINDINGS: Acute right femoral neck fracture with mild impaction and varus angulation. Femur is externally rotated. Femoroacetabular joint intact without dislocation. Mild joint space narrowing of both hips. Diffuse soft tissue swelling about the hip. IMPRESSION: Acute right femoral neck fracture with mild impaction and varus angulation. Electronically Signed   By: Davina Poke D.O.   On: 02/17/2021 12:13    Review of Systems  HENT:  Negative for ear discharge, ear pain, hearing loss and tinnitus.   Eyes:   Negative for photophobia and pain.  Respiratory:  Negative for cough and shortness of breath.   Cardiovascular:  Negative for chest pain.  Gastrointestinal:  Negative for abdominal pain, nausea and vomiting.  Genitourinary:  Negative for dysuria, flank pain, frequency and urgency.  Musculoskeletal:  Positive for arthralgias (Right hip). Negative for back pain, myalgias and neck pain.  Neurological:  Negative  for dizziness and headaches.  Hematological:  Does not bruise/bleed easily.  Psychiatric/Behavioral:  The patient is not nervous/anxious.   Blood pressure 117/63, pulse 73, temperature 97.9 F (36.6 C), temperature source Oral, resp. rate 16, height 5\' 11"  (1.803 m), weight 81.6 kg, SpO2 96 %. Physical Exam Constitutional:      General: He is not in acute distress.    Appearance: He is well-developed. He is not diaphoretic.  HENT:     Head: Normocephalic and atraumatic.  Eyes:     General: No scleral icterus.       Right eye: No discharge.        Left eye: No discharge.     Conjunctiva/sclera: Conjunctivae normal.  Cardiovascular:     Rate and Rhythm: Normal rate and regular rhythm.  Pulmonary:     Effort: Pulmonary effort is normal. No respiratory distress.  Musculoskeletal:     Cervical back: Normal range of motion.     Comments: RLE No traumatic wounds, ecchymosis, or rash  Mild TTP hip  No knee or ankle effusion  Knee stable to varus/ valgus and anterior/posterior stress  Sens DPN, SPN, TN intact  Motor EHL, ext, flex, evers 5/5  DP 2+, PT 1+, No significant edema  Skin:    General: Skin is warm and dry.  Neurological:     Mental Status: He is alert.  Psychiatric:        Mood and Affect: Mood normal.        Behavior: Behavior normal.    Assessment/Plan: Right hip fx -- Plan THA, surgeon and timing to be determined. Please keep NPO after MN tonight in case we can accommodate tomorrow.    Lisette Abu, PA-C Orthopedic Surgery [phone] 02/17/2021,  2:57 PM
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
## 2021-02-18 ENCOUNTER — Encounter (HOSPITAL_COMMUNITY): Payer: Self-pay | Admitting: Internal Medicine

## 2021-02-18 ENCOUNTER — Inpatient Hospital Stay (HOSPITAL_COMMUNITY): Payer: Medicare Other

## 2021-02-18 ENCOUNTER — Inpatient Hospital Stay (HOSPITAL_COMMUNITY): Payer: Medicare Other | Admitting: Anesthesiology

## 2021-02-18 ENCOUNTER — Encounter (HOSPITAL_COMMUNITY)
Admission: EM | Disposition: A | Discharge status: Home Health | Payer: Self-pay | Source: Home / Self Care | Attending: Internal Medicine

## 2021-02-18 DIAGNOSIS — S72001A Fracture of unspecified part of neck of right femur, initial encounter for closed fracture: Secondary | ICD-10-CM | POA: Diagnosis not present

## 2021-02-18 DIAGNOSIS — M12551 Traumatic arthropathy, right hip: Secondary | ICD-10-CM

## 2021-02-18 HISTORY — PX: TOTAL HIP ARTHROPLASTY: SHX124

## 2021-02-18 LAB — SURGICAL PCR SCREEN
MRSA, PCR: NEGATIVE
Staphylococcus aureus: NEGATIVE

## 2021-02-18 LAB — GLUCOSE, CAPILLARY
Glucose-Capillary: 117 mg/dL — ABNORMAL HIGH (ref 70–99)
Glucose-Capillary: 130 mg/dL — ABNORMAL HIGH (ref 70–99)
Glucose-Capillary: 117 mg/dL — ABNORMAL HIGH (ref 70–99)
Glucose-Capillary: 161 mg/dL — ABNORMAL HIGH (ref 70–99)
Glucose-Capillary: 168 mg/dL — ABNORMAL HIGH (ref 70–99)

## 2021-02-18 LAB — HEMOGLOBIN AND HEMATOCRIT, BLOOD
HCT: 39.2 % (ref 39.0–52.0)
Hemoglobin: 13.6 g/dL (ref 13.0–17.0)

## 2021-02-18 LAB — BASIC METABOLIC PANEL
Anion gap: 9 (ref 5–15)
BUN: 25 mg/dL — ABNORMAL HIGH (ref 8–23)
CO2: 25 mmol/L (ref 22–32)
Calcium: 8.9 mg/dL (ref 8.9–10.3)
Chloride: 95 mmol/L — ABNORMAL LOW (ref 98–111)
Creatinine, Ser: 1.41 mg/dL — ABNORMAL HIGH (ref 0.61–1.24)
GFR, Estimated: 52 mL/min — ABNORMAL LOW (ref >60)
Glucose, Bld: 130 mg/dL — ABNORMAL HIGH (ref 70–99)
Potassium: 3.8 mmol/L (ref 3.5–5.1)
Sodium: 129 mmol/L — ABNORMAL LOW (ref 135–145)

## 2021-02-18 SURGERY — ARTHROPLASTY, HIP, TOTAL, ANTERIOR APPROACH
Anesthesia: General | Site: Hip | Laterality: Right

## 2021-02-18 MED ORDER — ACETAMINOPHEN 160 MG/5ML PO SOLN: 325.0000 mg | ORAL | Status: DC | PRN

## 2021-02-18 MED ORDER — PROPOFOL 10 MG/ML IV BOLUS
INTRAVENOUS | Status: DC | PRN
  Administered 2021-02-18: 120 mg via INTRAVENOUS

## 2021-02-18 MED ORDER — ONDANSETRON HCL 4 MG PO TABS: 4.0000 mg | ORAL_TABLET | Freq: Four times a day (QID) | ORAL | Status: DC | PRN

## 2021-02-18 MED ORDER — FENTANYL CITRATE (PF) 100 MCG/2ML IJ SOLN
INTRAMUSCULAR | Status: DC | PRN
  Administered 2021-02-18: 100 ug via INTRAVENOUS
  Administered 2021-02-18: 50 ug via INTRAVENOUS
  Administered 2021-02-18 (×2): 100 ug via INTRAVENOUS

## 2021-02-18 MED ORDER — FENTANYL CITRATE (PF) 100 MCG/2ML IJ SOLN
INTRAMUSCULAR | Status: AC
  Filled 2021-02-18: qty 2

## 2021-02-18 MED ORDER — METOCLOPRAMIDE HCL 5 MG/ML IJ SOLN: 5.0000 mg | Freq: Three times a day (TID) | INTRAMUSCULAR | Status: DC | PRN

## 2021-02-18 MED ORDER — METHOCARBAMOL 500 MG PO TABS
500.0000 mg | ORAL_TABLET | Freq: Four times a day (QID) | ORAL | Status: DC | PRN
  Administered 2021-02-18: 500 mg via ORAL
  Filled 2021-02-18: qty 1

## 2021-02-18 MED ORDER — CEFAZOLIN SODIUM-DEXTROSE 2-4 GM/100ML-% IV SOLN
INTRAVENOUS | Status: AC
  Filled 2021-02-18: qty 100

## 2021-02-18 MED ORDER — LACTATED RINGERS IV SOLN: INTRAVENOUS | Status: DC

## 2021-02-18 MED ORDER — FENTANYL CITRATE (PF) 250 MCG/5ML IJ SOLN
INTRAMUSCULAR | Status: AC
  Filled 2021-02-18: qty 5

## 2021-02-18 MED ORDER — METHOCARBAMOL 1000 MG/10ML IJ SOLN
500.0000 mg | Freq: Four times a day (QID) | INTRAVENOUS | Status: DC | PRN
  Filled 2021-02-18: qty 5

## 2021-02-18 MED ORDER — DEXAMETHASONE SODIUM PHOSPHATE 4 MG/ML IJ SOLN
INTRAMUSCULAR | Status: DC | PRN
  Administered 2021-02-18: 8 mg via INTRAVENOUS

## 2021-02-18 MED ORDER — LIDOCAINE 2% (20 MG/ML) 5 ML SYRINGE
INTRAMUSCULAR | Status: DC | PRN
  Administered 2021-02-18: 100 mg via INTRAVENOUS

## 2021-02-18 MED ORDER — OXYCODONE HCL 5 MG PO TABS: 5.0000 mg | ORAL_TABLET | Freq: Once | ORAL | Status: DC | PRN

## 2021-02-18 MED ORDER — ORAL CARE MOUTH RINSE: 15.0000 mL | Freq: Once | OROMUCOSAL | Status: AC

## 2021-02-18 MED ORDER — CEFAZOLIN SODIUM-DEXTROSE 1-4 GM/50ML-% IV SOLN
1.0000 g | Freq: Four times a day (QID) | INTRAVENOUS | Status: AC
  Administered 2021-02-18 – 2021-02-19 (×2): 1 g via INTRAVENOUS
  Filled 2021-02-18 (×2): qty 50

## 2021-02-18 MED ORDER — OXYCODONE HCL 5 MG PO TABS
5.0000 mg | ORAL_TABLET | ORAL | Status: DC | PRN
  Administered 2021-02-19 – 2021-02-22 (×7): 10 mg via ORAL
  Filled 2021-02-18 (×8): qty 2

## 2021-02-18 MED ORDER — DIPHENHYDRAMINE HCL 12.5 MG/5ML PO ELIX: 12.5000 mg | ORAL_SOLUTION | ORAL | Status: DC | PRN

## 2021-02-18 MED ORDER — ROCURONIUM BROMIDE 10 MG/ML (PF) SYRINGE
PREFILLED_SYRINGE | INTRAVENOUS | Status: DC | PRN
  Administered 2021-02-18: 60 mg via INTRAVENOUS

## 2021-02-18 MED ORDER — DEXAMETHASONE SODIUM PHOSPHATE 10 MG/ML IJ SOLN
INTRAMUSCULAR | Status: AC
  Filled 2021-02-18: qty 1

## 2021-02-18 MED ORDER — MENTHOL 3 MG MT LOZG: 1.0000 | LOZENGE | OROMUCOSAL | Status: DC | PRN

## 2021-02-18 MED ORDER — 0.9 % SODIUM CHLORIDE (POUR BTL) OPTIME
TOPICAL | Status: DC | PRN
  Administered 2021-02-18: 1000 mL

## 2021-02-18 MED ORDER — OXYCODONE HCL 5 MG/5ML PO SOLN: 5.0000 mg | Freq: Once | ORAL | Status: DC | PRN

## 2021-02-18 MED ORDER — ASPIRIN 81 MG PO CHEW
81.0000 mg | CHEWABLE_TABLET | Freq: Every day | ORAL | Status: DC
  Administered 2021-02-19 – 2021-02-22 (×4): 81 mg via ORAL
  Filled 2021-02-18 (×4): qty 1

## 2021-02-18 MED ORDER — ACETAMINOPHEN 325 MG PO TABS: 325.0000 mg | ORAL_TABLET | ORAL | Status: DC | PRN

## 2021-02-18 MED ORDER — DOCUSATE SODIUM 100 MG PO CAPS
100.0000 mg | ORAL_CAPSULE | Freq: Two times a day (BID) | ORAL | Status: DC
  Administered 2021-02-18 – 2021-02-22 (×8): 100 mg via ORAL
  Filled 2021-02-18 (×8): qty 1

## 2021-02-18 MED ORDER — SODIUM CHLORIDE 0.9 % IV SOLN: INTRAVENOUS | Status: DC

## 2021-02-18 MED ORDER — PHENOL 1.4 % MT LIQD: 1.0000 | OROMUCOSAL | Status: DC | PRN

## 2021-02-18 MED ORDER — CLOPIDOGREL BISULFATE 75 MG PO TABS
75.0000 mg | ORAL_TABLET | Freq: Every day | ORAL | Status: DC
  Administered 2021-02-19 – 2021-02-22 (×4): 75 mg via ORAL
  Filled 2021-02-18 (×4): qty 1

## 2021-02-18 MED ORDER — METOCLOPRAMIDE HCL 5 MG PO TABS: 5.0000 mg | ORAL_TABLET | Freq: Three times a day (TID) | ORAL | Status: DC | PRN

## 2021-02-18 MED ORDER — SUGAMMADEX SODIUM 200 MG/2ML IV SOLN
INTRAVENOUS | Status: DC | PRN
  Administered 2021-02-18: 163.2 mg via INTRAVENOUS

## 2021-02-18 MED ORDER — CHLORHEXIDINE GLUCONATE 0.12 % MT SOLN
OROMUCOSAL | Status: AC
  Filled 2021-02-18: qty 15

## 2021-02-18 MED ORDER — ALUM & MAG HYDROXIDE-SIMETH 200-200-20 MG/5ML PO SUSP: 30.0000 mL | ORAL | Status: DC | PRN

## 2021-02-18 MED ORDER — HYDROMORPHONE HCL 1 MG/ML IJ SOLN
0.5000 mg | INTRAMUSCULAR | Status: DC | PRN
  Administered 2021-02-18: 1 mg via INTRAVENOUS
  Administered 2021-02-19: 0.5 mg via INTRAVENOUS
  Filled 2021-02-18 (×2): qty 1

## 2021-02-18 MED ORDER — OXYCODONE HCL 5 MG PO TABS
10.0000 mg | ORAL_TABLET | ORAL | Status: DC | PRN
  Administered 2021-02-19 – 2021-02-21 (×2): 10 mg via ORAL
  Filled 2021-02-18: qty 2

## 2021-02-18 MED ORDER — ONDANSETRON HCL 4 MG/2ML IJ SOLN: 4.0000 mg | Freq: Four times a day (QID) | INTRAMUSCULAR | Status: DC | PRN

## 2021-02-18 MED ORDER — ONDANSETRON HCL 4 MG/2ML IJ SOLN
INTRAMUSCULAR | Status: AC
  Filled 2021-02-18: qty 2

## 2021-02-18 MED ORDER — CEFAZOLIN SODIUM-DEXTROSE 2-3 GM-%(50ML) IV SOLR
INTRAVENOUS | Status: DC | PRN
  Administered 2021-02-18: 2 g via INTRAVENOUS

## 2021-02-18 MED ORDER — ACETAMINOPHEN 10 MG/ML IV SOLN
INTRAVENOUS | Status: DC | PRN
  Administered 2021-02-18: 1000 mg via INTRAVENOUS

## 2021-02-18 MED ORDER — ONDANSETRON HCL 4 MG/2ML IJ SOLN
4.0000 mg | Freq: Once | INTRAMUSCULAR | Status: AC | PRN
  Administered 2021-02-18: 4 mg via INTRAVENOUS

## 2021-02-18 MED ORDER — FENTANYL CITRATE (PF) 100 MCG/2ML IJ SOLN
25.0000 ug | INTRAMUSCULAR | Status: DC | PRN
  Administered 2021-02-18: 50 ug via INTRAVENOUS

## 2021-02-18 MED ORDER — TRANEXAMIC ACID-NACL 1000-0.7 MG/100ML-% IV SOLN
1000.0000 mg | INTRAVENOUS | Status: AC
  Administered 2021-02-18: 1000 mg via INTRAVENOUS
  Filled 2021-02-18: qty 100

## 2021-02-18 MED ORDER — ACETAMINOPHEN 325 MG PO TABS: 325.0000 mg | ORAL_TABLET | Freq: Four times a day (QID) | ORAL | Status: DC | PRN

## 2021-02-18 MED ORDER — PANTOPRAZOLE SODIUM 40 MG PO TBEC: 40.0000 mg | DELAYED_RELEASE_TABLET | Freq: Every day | ORAL | Status: DC

## 2021-02-18 MED ORDER — ONDANSETRON HCL 4 MG/2ML IJ SOLN
INTRAMUSCULAR | Status: DC | PRN
  Administered 2021-02-18: 4 mg via INTRAVENOUS

## 2021-02-18 MED ORDER — SODIUM CHLORIDE 0.9 % IR SOLN
Status: DC | PRN
  Administered 2021-02-18: 1000 mL

## 2021-02-18 MED ORDER — MEPERIDINE HCL 25 MG/ML IJ SOLN
6.2500 mg | INTRAMUSCULAR | Status: DC | PRN
Start: 2021-02-18 — End: 2021-02-18

## 2021-02-18 MED ORDER — LIDOCAINE 2% (20 MG/ML) 5 ML SYRINGE
INTRAMUSCULAR | Status: AC
  Filled 2021-02-18: qty 5

## 2021-02-18 MED ORDER — CHLORHEXIDINE GLUCONATE 0.12 % MT SOLN
15.0000 mL | Freq: Once | OROMUCOSAL | Status: AC
  Administered 2021-02-18: 15 mL via OROMUCOSAL

## 2021-02-18 SURGICAL SUPPLY — 56 items
APL SKNCLS STERI-STRIP NONHPOA (GAUZE/BANDAGES/DRESSINGS) ×1
BAG COUNTER SPONGE SURGICOUNT (BAG) ×2 IMPLANT
BAG SPNG CNTER NS LX DISP (BAG) ×1
BALL HIP ARTICU EZE 36 8.5 (Hips) IMPLANT
BENZOIN TINCTURE PRP APPL 2/3 (GAUZE/BANDAGES/DRESSINGS) ×2 IMPLANT
BLADE CLIPPER SURG (BLADE) IMPLANT
BLADE SAW SGTL 18X1.27X75 (BLADE) ×2 IMPLANT
COVER SURGICAL LIGHT HANDLE (MISCELLANEOUS) ×2 IMPLANT
CUP SECTOR GRIPTON 58MM (Orthopedic Implant) ×1 IMPLANT
DRAPE C-ARM 42X72 X-RAY (DRAPES) ×2 IMPLANT
DRAPE STERI IOBAN 125X83 (DRAPES) ×2 IMPLANT
DRAPE U-SHAPE 47X51 STRL (DRAPES) ×6 IMPLANT
DRSG AQUACEL AG ADV 3.5X10 (GAUZE/BANDAGES/DRESSINGS) ×2 IMPLANT
DURAPREP 26ML APPLICATOR (WOUND CARE) ×2 IMPLANT
ELECT BLADE 4.0 EZ CLEAN MEGAD (MISCELLANEOUS) ×2
ELECT BLADE 6.5 EXT (BLADE) IMPLANT
ELECT REM PT RETURN 9FT ADLT (ELECTROSURGICAL) ×2
ELECTRODE BLDE 4.0 EZ CLN MEGD (MISCELLANEOUS) ×1 IMPLANT
ELECTRODE REM PT RTRN 9FT ADLT (ELECTROSURGICAL) ×1 IMPLANT
FACESHIELD WRAPAROUND (MASK) ×4 IMPLANT
FACESHIELD WRAPAROUND OR TEAM (MASK) ×2 IMPLANT
GLOVE SRG 8 PF TXTR STRL LF DI (GLOVE) ×2 IMPLANT
GLOVE SURG LTX SZ8 (GLOVE) ×2 IMPLANT
GLOVE SURG ORTHO LTX SZ7.5 (GLOVE) ×4 IMPLANT
GLOVE SURG UNDER POLY LF SZ8 (GLOVE) ×4
GOWN STRL REUS W/ TWL LRG LVL3 (GOWN DISPOSABLE) ×2 IMPLANT
GOWN STRL REUS W/ TWL XL LVL3 (GOWN DISPOSABLE) ×2 IMPLANT
GOWN STRL REUS W/TWL LRG LVL3 (GOWN DISPOSABLE) ×4
GOWN STRL REUS W/TWL XL LVL3 (GOWN DISPOSABLE) ×4
HANDPIECE INTERPULSE COAX TIP (DISPOSABLE) ×2
HIP BALL ARTICU EZE 36 8.5 (Hips) ×2 IMPLANT
KIT BASIN OR (CUSTOM PROCEDURE TRAY) ×2 IMPLANT
KIT TURNOVER KIT B (KITS) ×2 IMPLANT
LINER NEUTRAL 52X36X58N (Liner) ×1 IMPLANT
MANIFOLD NEPTUNE II (INSTRUMENTS) ×2 IMPLANT
NS IRRIG 1000ML POUR BTL (IV SOLUTION) ×2 IMPLANT
PACK TOTAL JOINT (CUSTOM PROCEDURE TRAY) ×2 IMPLANT
PAD ARMBOARD 7.5X6 YLW CONV (MISCELLANEOUS) ×2 IMPLANT
SCREW 6.5MMX25MM (Screw) ×1 IMPLANT
SCREW 6.5MMX30MM (Screw) ×1 IMPLANT
SET HNDPC FAN SPRY TIP SCT (DISPOSABLE) ×1 IMPLANT
STAPLER VISISTAT 35W (STAPLE) IMPLANT
STEM FEMORAL SZ6 HIGH ACTIS (Stem) ×1 IMPLANT
STRIP CLOSURE SKIN 1/2X4 (GAUZE/BANDAGES/DRESSINGS) ×4 IMPLANT
SUT ETHIBOND NAB CT1 #1 30IN (SUTURE) ×2 IMPLANT
SUT MNCRL AB 4-0 PS2 18 (SUTURE) IMPLANT
SUT VIC AB 0 CT1 27 (SUTURE) ×2
SUT VIC AB 0 CT1 27XBRD ANBCTR (SUTURE) ×1 IMPLANT
SUT VIC AB 1 CT1 27 (SUTURE) ×2
SUT VIC AB 1 CT1 27XBRD ANBCTR (SUTURE) ×1 IMPLANT
SUT VIC AB 2-0 CT1 27 (SUTURE) ×2
SUT VIC AB 2-0 CT1 TAPERPNT 27 (SUTURE) ×1 IMPLANT
TOWEL GREEN STERILE (TOWEL DISPOSABLE) ×2 IMPLANT
TOWEL GREEN STERILE FF (TOWEL DISPOSABLE) ×2 IMPLANT
TRAY CATH 16FR W/PLASTIC CATH (SET/KITS/TRAYS/PACK) IMPLANT
WATER STERILE IRR 1000ML POUR (IV SOLUTION) ×4 IMPLANT

## 2021-02-18 NOTE — Brief Op Note (Signed)
02/17/2021 - 02/18/2021  5:34 PM  PATIENT:  Joel Gonzales  75 y.o. male  PRE-OPERATIVE DIAGNOSIS:  Right Hip Fracture  POST-OPERATIVE DIAGNOSIS:  Right Hip Fracture  PROCEDURE:  Procedure(s): TOTAL HIP ARTHROPLASTY ANTERIOR APPROACH (Right)  SURGEON:  Surgeon(s) and Role:    Mcarthur Rossetti, MD - Primary  PHYSICIAN ASSISTANT:  Benita Stabile, PA-C  ANESTHESIA:   general  EBL:  200 mL   COUNTS:  YES  DICTATION: .Other Dictation: Dictation Number [id number]  PLAN OF CARE: Admit to inpatient   PATIENT DISPOSITION:  PACU - hemodynamically stable.   Delay start of Pharmacological VTE agent (>24hrs) due to surgical blood loss or risk of bleeding: no

## 2021-02-18 NOTE — Progress Notes (Signed)
Initial Nutrition Assessment  DOCUMENTATION CODES:   Non-severe (moderate) malnutrition in context of acute illness/injury  INTERVENTION:  - Encourage PO intake  - Spoke with pt about importance of protein and adequate nutrition for healing and prevention of muscle losses during acute illness  NUTRITION DIAGNOSIS:   Moderate Malnutrition related to acute illness (rib fracture, hip fracture) as evidenced by mild fat depletion, mild muscle depletion.  GOAL:   Patient will meet greater than or equal to 90% of their needs  MONITOR:   PO intake, Diet advancement, Labs, Weight trends  REASON FOR ASSESSMENT:   Consult Assessment of nutrition requirement/status (hip fracture)  ASSESSMENT:   Pt admitted with R hip fracture. Pt also with previous fall 1 month ago sustaining 4 rib fracture on R side. PMH includes CAD s/p CABG about 20 years ago, HTN, T2DM, and HLD.  Per orthopedics, plan for total hip arthroplasty today.  Pt reports that he typically eats 3 meals per day. For breakfast he will eat cheerios with some milk and/or an egg biscuit, a salad for lunch and chicken for dinner. Pt reports wanting to lose more weight and explained the importance of protein and adequate nutrition to promote healing and prevention of muscle losses during hospital admission. He understands the importance and would prefer not to receive Ensure or Boost. Once his diet resumes, encouraged pt to eat and that we will reassess his needs as we obtain documentation of meal completions.  Pt states that his usual weight is 194 lbs and endorses a 10 lbs weight loss x 1 month since he last fell and fractured his ribs although he reports continuing to eat 3 meals per day despite the pain. D/t limited documentation of weight history unable to confirm this. Pt is noted to be 180 lbs this admission.   Medications: SSI, protonix  Labs: sodium 129, BUN 25, Cr 1.41, HgbA1c 6.9% CBG's: 117-150 x24 hours  NUTRITION -  FOCUSED PHYSICAL EXAM:  Flowsheet Row Most Recent Value  Orbital Region Mild depletion  Upper Arm Region Moderate depletion  Thoracic and Lumbar Region Mild depletion  Buccal Region Mild depletion  Temple Region No depletion  Clavicle Bone Region Mild depletion  Clavicle and Acromion Bone Region Mild depletion  Scapular Bone Region No depletion  Dorsal Hand Mild depletion  Patellar Region Mild depletion  [unable to assess R leg d/t pain]  Anterior Thigh Region Mild depletion  Posterior Calf Region Mild depletion  Edema (RD Assessment) None  Hair Reviewed  Eyes Reviewed  Mouth Reviewed  Skin Reviewed  Nails Reviewed      Diet Order:   Diet Order             Diet NPO time specified  Diet effective midnight                   EDUCATION NEEDS:   Education needs have been addressed  Skin:  Skin Assessment: Reviewed RN Assessment  Last BM:  02/17/21  Height:   Ht Readings from Last 1 Encounters:  02/17/21 5\' 11"  (1.803 m)    Weight:   Wt Readings from Last 1 Encounters:  02/17/21 81.6 kg    BMI:  Body mass index is 25.1 kg/m.  Estimated Nutritional Needs:   Kcal:  2000-2200  Protein:  100-110g  Fluid:  >2.0L  Clayborne Dana, RDN, LDN Clinical Nutrition

## 2021-02-18 NOTE — Transfer of Care (Signed)
Immediate Anesthesia Transfer of Care Note  Patient: Joel Gonzales.  Procedure(s) Performed: TOTAL HIP ARTHROPLASTY ANTERIOR APPROACH (Right: Hip)  Patient Location: PACU  Anesthesia Type:General  Level of Consciousness: sedated  Airway & Oxygen Therapy: Patient Spontanous Breathing  Post-op Assessment: Report given to RN and Post -op Vital signs reviewed and stable  Post vital signs: Reviewed and stable  Last Vitals:  Vitals Value Taken Time  BP 161/75 02/18/21 1750  Temp 36.8 C 02/18/21 1750  Pulse 82 02/18/21 1754  Resp 22 02/18/21 1754  SpO2 97 % 02/18/21 1754  Vitals shown include unvalidated device data.  Last Pain:  Vitals:   02/18/21 1545  TempSrc: Oral  PainSc: 0-No pain         Complications: No notable events documented.

## 2021-02-18 NOTE — TOC CAGE-AID Note (Signed)
Transition of Care [facility]) - CAGE-AID Screening   Patient Details  Name: Clayvon Parlett. MRN: [id number] Date of Birth: 04-04-1945  Transition of Care [facility]) CM/SW Contact:    Keimani Laufer C Tarpley-Carter, LCSWA Phone Number: 02/18/2021, 9:09 AM   Clinical Narrative: Pt participated in [place].  Pt stated he does not use substance or ETOH.  Pt was not offered resources, due to no usage of substance or ETOH.   Cedric Denison Tarpley-Carter, MSW, LCSW-A Pronouns:  She/Her/Hers [facility] HealthTransitions of Care Clinical Social Worker Direct Number:  [phone] [email or web address]    CAGE-AID Screening:    Have You Ever Felt You Ought to Cut Down on Your Drinking or Drug Use?: No Have People Annoyed You By [employer] Your Drinking Or Drug Use?: No Have You Felt Bad Or Guilty About Your Drinking Or Drug Use?: No Have You Ever Had a Drink or Used Drugs First Thing In The Morning to Steady Your Nerves or to Get Rid of a Hangover?: No CAGE-AID Score: 0  Substance Abuse Education Offered: No

## 2021-02-18 NOTE — Anesthesia Preprocedure Evaluation (Addendum)
Anesthesia Evaluation  Patient identified by MRN, date of birth, ID band Patient awake    Reviewed: Allergy & Precautions, H&P , NPO status , Patient's Chart, lab work & pertinent test results, reviewed documented beta blocker date and time   Airway Mallampati: II  TM Distance: >3 FB Neck ROM: full    Dental no notable dental hx. (+) Teeth Intact, Dental Advisory Given, Caps   Pulmonary neg pulmonary ROS,    Pulmonary exam normal breath sounds clear to auscultation       Cardiovascular Exercise Tolerance: Good hypertension, + CAD and + CABG  Normal cardiovascular exam+ dysrhythmias  Rhythm:regular Rate:Normal     Neuro/Psych negative neurological ROS  negative psych ROS   GI/Hepatic negative GI ROS, Neg liver ROS,   Endo/Other  negative endocrine ROSdiabetes  Renal/GU negative Renal ROS  negative genitourinary   Musculoskeletal   Abdominal   Peds  Hematology negative hematology ROS (+)   Anesthesia Other Findings   Reproductive/Obstetrics negative OB ROS                            Anesthesia Physical Anesthesia Plan  ASA: 3  Anesthesia Plan: General   Post-op Pain Management: Celebrex PO (pre-op) and Ofirmev IV (intra-op)   Induction: Intravenous  PONV Risk Score and Plan: 2  Airway Management Planned: Oral ETT and LMA  Additional Equipment: None  Intra-op Plan:   Post-operative Plan: Extubation in OR  Informed Consent: I have reviewed the patients History and Physical, chart, labs and discussed the procedure including the risks, benefits and alternatives for the proposed anesthesia with the patient or authorized representative who has indicated his/her understanding and acceptance.     Dental Advisory Given  Plan Discussed with: CRNA and Anesthesiologist  Anesthesia Plan Comments: (Good work tolerance and able to climb several flights of stars without problems.  Besides  recent hip fracture pt has suffered Right rib fractures one month ago and says tolerance is down some.   )       Anesthesia Quick Evaluation

## 2021-02-18 NOTE — Anesthesia Procedure Notes (Signed)
Procedure Name: Intubation Date/Time: 02/18/2021 4:30 PM Performed by: Minerva Ends, CRNA Pre-anesthesia Checklist: Patient identified, Emergency Drugs available, Suction available and Patient being monitored Patient Re-evaluated:Patient Re-evaluated prior to induction Oxygen Delivery Method: Circle system utilized Preoxygenation: Pre-oxygenation with 100% oxygen Induction Type: IV induction Ventilation: Mask ventilation with difficulty Laryngoscope Size: Mac and 3 Grade View: Grade II Tube type: Oral Tube size: 7.0 mm Number of attempts: 1 Airway Equipment and Method: Stylet Placement Confirmation: ETT inserted through vocal cords under direct vision, positive ETCO2 and breath sounds checked- equal and bilateral Secured at: 23 cm Tube secured with: Tape Dental Injury: Teeth and Oropharynx as per pre-operative assessment

## 2021-02-18 NOTE — Progress Notes (Signed)
Patient ID: Joel Gonzales., male   DOB: 01-Jan-1946, 75 y.o.   MRN: [id number] The patient understands that we are recommending a right total hip arthroplasty through an anterior approach to treat his acute right hip femoral neck fracture.  I have explained to him the rationale behind our recommendation.  We talked about nonoperative and operative treatment measures.  He is a very active 75 year old gentleman and this is appropriate surgery to recommend.  I described in detail the risk and benefits of the surgery.  Informed consent is obtained.  The right operative hip has been marked.

## 2021-02-18 NOTE — Progress Notes (Signed)
PROGRESS NOTE  Joel Gonzales.  DOB: 1945/06/14  PCP: Haywood Pao, MD WVP:[id number]  DOA: 02/17/2021  LOS: 1 day  Hospital Day: 2  Chief Complaint  Patient presents with   level 2 fall on thinners    Brief narrative: Joel Visser. is a 75 y.o. male with PMH significant for DM2, HTN, HLD, CAD s/p CABG about 20 years ago who lives at home with his wife and does not need any ambulatory assistance. Patient presented to the ED on 11/17 after mechanical fall leading to right hip pain.  He slipped and fell back hitting the right side of his body and hip while walking down the stairs.  Denies any head injury or loss of consciousness.  No premonition symptoms of lightheadedness or chest pain or shortness of breath. Of note, about a month ago he had 4 rib fractures on the right side after being pushed by his neighbors dog.  He states he recovered well from that injury.  Denies any gait imbalance or unsteadiness at baseline.  Imaging in the ED right sided femoral neck fracture.   Patient was admitted to hospitalist service Orthopedics consulted.  Subjective: Patient was seen and examined this morning.  Pleasant elderly Caucasian male.  Lying on bed.  Not in distress.  No pain at rest.  Denies any unsteadiness at baseline. Pending surgery this afternoon. Chart reviewed Hemodynamically stable Labs this morning with further drop in sodium to 129, creatinine slightly better at 1.41  Assessment/Plan: Right hip fracture secondary to mechanical fall -Seen by orthopedics.  Pending ORIF today. -Pain control and DVT prophylaxis per orthopedics.  AKI on CKD 2 -Presented with creatinine elevated 1.51, slightly better this morning.  Continue to monitor Recent Labs    02/17/21 1149 02/18/21 0202  BUN 25* 25*  CREATININE 1.51* 1.41*   Type 2 diabetes mellitus -A1c 6.9 in 11/17 -Home meds include Synjardy XR 25/1000 mg daily, Actos 15 mg daily -Currently oral meds on hold.  On  sliding scale insulin with Accu-Cheks. Recent Labs  Lab 02/17/21 1911 02/18/21 0837  GLUCAP 150* 117*   Essential hypertension -Home meds include metoprolol succinate 50 mg daily, ramipril 5 mg twice daily, spironolactone 12.5 mg daily and HCTZ 12.5 mg daily -Metoprolol continued.  Others on hold at this time. -Hydralazine IV as needed  CAD s/p CABG several years ago -Currently has no anginal symptoms.  No EKG changes.   Hyponatremia -Mild, euvolemic, hold HCTZ for now. Recent Labs  Lab 02/17/21 1149 02/18/21 0202  NA 132* 129*   Recent multiple ribs fracture -Recovered well after that.  Currently respiratory status stable.     Mobility: Encourage ambulation.  Needs PT eval postprocedure Living condition: Was living at home with wife Goals of care:   Code Status: Full Code  Nutritional status: Body mass index is 25.1 kg/m.     Diet:  Diet Order             Diet NPO time specified  Diet effective midnight                  DVT prophylaxis:  enoxaparin (LOVENOX) injection 40 mg Start: 02/18/21 1000   Antimicrobials: None Fluid: Start NS 75 perioperatively Consultants: Orthopedics Family Communication: None at bedside  Status is: Inpatient  Remains inpatient appropriate because: Pending surgery  Dispo: The patient is from: Home              Anticipated d/c is to: PT eval  postprocedure              Patient currently is not medically stable to d/c.   Difficult to place patient No     Infusions:   sodium chloride      Scheduled Meds:  atorvastatin  80 mg Oral QHS   enoxaparin (LOVENOX) injection  40 mg Subcutaneous Q24H   insulin aspart  0-15 Units Subcutaneous TID WC   metoprolol succinate  50 mg Oral Daily   pantoprazole  40 mg Oral Daily    PRN meds: bisacodyl, hydrALAZINE, HYDROmorphone (DILAUDID) injection, ondansetron (ZOFRAN) IV, oxyCODONE   Antimicrobials: Anti-infectives (From admission, onward)    None        Objective: Vitals:   02/18/21 0421 02/18/21 0802  BP: 135/83 140/70  Pulse: 75 79  Resp: 19 18  Temp: 97.8 F (36.6 C) 97.9 F (36.6 C)  SpO2: 98% 92%    Intake/Output Summary (Last 24 hours) at 02/18/2021 1058 Last data filed at 02/17/2021 1207 Gross per 24 hour  Intake 0 ml  Output 0 ml  Net 0 ml   Filed Weights   02/17/21 1204  Weight: 81.6 kg   Weight change:  Body mass index is 25.1 kg/m.   Physical Exam: General exam: Pleasant, elderly Caucasian male.  Not in physical distress Skin: No rashes, lesions or ulcers. HEENT: Atraumatic, normocephalic, no obvious bleeding Lungs: Clear to auscultation bilaterally CVS: Regular rate and rhythm, no murmur GI/Abd soft, nontender, nondistended, bowel sound present CNS: Alert, awake, oriented x3 Psychiatry: Mood appropriate Extremities: No pedal edema, no calf tenderness  Data Review: I have personally reviewed the laboratory data and studies available.  F/u labs ordered Unresulted Labs (From admission, onward)     Start     Ordered   02/19/21 0500  CBC with Differential/Platelet  Daily,   R      02/18/21 1058   02/19/21 3159  Basic metabolic panel  Daily,   R      02/18/21 1058   02/18/21 1044  Surgical pcr screen  Once,   R        02/18/21 1044            Signed, Terrilee Croak, MD [facility] 02/18/2021

## 2021-02-18 NOTE — Plan of Care (Signed)

## 2021-02-19 DIAGNOSIS — S72001A Fracture of unspecified part of neck of right femur, initial encounter for closed fracture: Secondary | ICD-10-CM | POA: Diagnosis not present

## 2021-02-19 DIAGNOSIS — E440 Moderate protein-calorie malnutrition: Secondary | ICD-10-CM | POA: Insufficient documentation

## 2021-02-19 LAB — CBC WITH DIFFERENTIAL/PLATELET
Abs Immature Granulocytes: 0.07 10*3/uL (ref 0.00–0.07)
Basophils Absolute: 0 10*3/uL (ref 0.0–0.1)
Basophils Relative: 0 %
Eosinophils Absolute: 0 10*3/uL (ref 0.0–0.5)
Eosinophils Relative: 0 %
HCT: 33.5 % — ABNORMAL LOW (ref 39.0–52.0)
Hemoglobin: 11.6 g/dL — ABNORMAL LOW (ref 13.0–17.0)
Immature Granulocytes: 1 %
Lymphocytes Relative: 4 %
Lymphs Abs: 0.5 10*3/uL — ABNORMAL LOW (ref 0.7–4.0)
MCH: 32.2 pg (ref 26.0–34.0)
MCHC: 34.6 g/dL (ref 30.0–36.0)
MCV: 93.1 fL (ref 80.0–100.0)
Monocytes Absolute: 0.4 10*3/uL (ref 0.1–1.0)
Monocytes Relative: 4 %
Neutro Abs: 11.2 10*3/uL — ABNORMAL HIGH (ref 1.7–7.7)
Neutrophils Relative %: 91 %
Platelets: 215 10*3/uL (ref 150–400)
RBC: 3.6 MIL/uL — ABNORMAL LOW (ref 4.22–5.81)
RDW: 13.3 % (ref 11.5–15.5)
WBC: 12.3 10*3/uL — ABNORMAL HIGH (ref 4.0–10.5)
nRBC: 0 % (ref 0.0–0.2)

## 2021-02-19 LAB — GLUCOSE, CAPILLARY
Glucose-Capillary: 157 mg/dL — ABNORMAL HIGH (ref 70–99)
Glucose-Capillary: 209 mg/dL — ABNORMAL HIGH (ref 70–99)
Glucose-Capillary: 183 mg/dL — ABNORMAL HIGH (ref 70–99)
Glucose-Capillary: 184 mg/dL — ABNORMAL HIGH (ref 70–99)

## 2021-02-19 LAB — BASIC METABOLIC PANEL
Anion gap: 12 (ref 5–15)
BUN: 27 mg/dL — ABNORMAL HIGH (ref 8–23)
CO2: 22 mmol/L (ref 22–32)
Calcium: 8.4 mg/dL — ABNORMAL LOW (ref 8.9–10.3)
Chloride: 94 mmol/L — ABNORMAL LOW (ref 98–111)
Creatinine, Ser: 1.5 mg/dL — ABNORMAL HIGH (ref 0.61–1.24)
GFR, Estimated: 48 mL/min — ABNORMAL LOW (ref >60)
Glucose, Bld: 208 mg/dL — ABNORMAL HIGH (ref 70–99)
Potassium: 4.4 mmol/L (ref 3.5–5.1)
Sodium: 128 mmol/L — ABNORMAL LOW (ref 135–145)

## 2021-02-19 LAB — OSMOLALITY, URINE: Osmolality, Ur: 483 mOsm/kg (ref 300–900)

## 2021-02-19 LAB — OSMOLALITY: Osmolality: 285 mOsm/kg (ref 275–295)

## 2021-02-19 NOTE — Progress Notes (Signed)
PROGRESS NOTE  Joel Gonzales.  DOB: Sep 25, 1945  PCP: Haywood Pao, MD JSE:[id number]  DOA: 02/17/2021  LOS: 2 days  Hospital Day: 3  Chief Complaint  Patient presents with   level 2 fall on thinners    Brief narrative: Joel Dames. is a 75 y.o. male with PMH significant for DM2, HTN, HLD, CAD s/p CABG about 20 years ago who lives at home with his wife and does not need any ambulatory assistance. Patient presented to the ED on 11/17 after mechanical fall leading to right hip pain.  He slipped and fell back hitting the right side of his body and hip while walking down the stairs.  Denies any head injury or loss of consciousness.  No premonition symptoms of lightheadedness or chest pain or shortness of breath. Of note, about a month ago he had 4 rib fractures on the right side after being pushed by his neighbors dog.  He states he recovered well from that injury.  Denies any gait imbalance or unsteadiness at baseline.  Imaging in the ED right sided femoral neck fracture.   Patient was admitted to hospitalist service Orthopedics consulted.  Subjective: Patient was seen and examined this morning.  Sitting up in chair.  Not in distress.  Pain controlled.  Wife at bedside.  Patient not sure what his baseline sodium level is.  He has blood work done with his PCP that he can bring up tomorrow.  Assessment/Plan: Right hip fracture secondary to mechanical fall -Seen by orthopedics.   -11/18, underwent right total hip arthroplasty. -Pain controlled  AKI on CKD 2 -Presented with creatinine elevated to 1.51.  Unclear baseline.  Creatinine remains stable at this time. Recent Labs    02/17/21 1149 02/18/21 0202 02/19/21 0135  BUN 25* 25* 27*  CREATININE 1.51* 1.41* 1.50*   Type 2 diabetes mellitus -A1c 6.9 in 11/17 -Home meds include Synjardy XR 25/1000 mg daily, Actos 15 mg daily -Currently oral meds on hold.  On sliding scale insulin with Accu-Cheks. Recent Labs  Lab  02/18/21 1542 02/18/21 1754 02/18/21 2009 02/19/21 0712 02/19/21 1113  GLUCAP 117* 161* 168* 157* 209*   Essential hypertension -Home meds include metoprolol succinate 50 mg daily, ramipril 5 mg twice daily, spironolactone 12.5 mg daily and HCTZ 12.5 mg daily -Metoprolol continued.  Others on hold at this time. -Hydralazine IV as needed  CAD s/p CABG several years ago History of stroke -Currently has no anginal symptoms.  No EKG changes. -Continue Plavix and statin   Hyponatremia -Mild, euvolemic, HCTZ on hold at this time. -Sodium level further low today.  Unclear etiology. Obtain serum osmolality and urine osmolality level. -Recheck sodium level tomorrow. Recent Labs  Lab 02/17/21 1149 02/18/21 0202 02/19/21 0135  NA 132* 129* 128*   Recent multiple ribs fracture -Recovered well after that.  Currently respiratory status stable.     Mobility: Encourage ambulation.  Needs PT eval postprocedure Living condition: Was living at home with wife Goals of care:   Code Status: Full Code  Nutritional status: Body mass index is 25.1 kg/m. Nutrition Problem: Moderate Malnutrition Etiology: acute illness (rib fracture, hip fracture) Signs/Symptoms: mild fat depletion, mild muscle depletion Diet:  Diet Order             Diet regular Room service appropriate? Yes; Fluid consistency: Thin  Diet effective now                  DVT prophylaxis:  SCDs Start:  02/18/21 1917 Place TED hose Start: 02/18/21 1917   Antimicrobials: None Fluid: Off IV fluid today Consultants: Orthopedics Family Communication: None at bedside  Status is: Inpatient  Remains inpatient appropriate because: POD1  Dispo: The patient is from: Home              Anticipated d/c is to: PT eval pending              Patient currently is not medically stable to d/c.   Difficult to place patient No     Infusions:   sodium chloride 75 mL/hr at 02/18/21 2136   methocarbamol (ROBAXIN) IV       Scheduled Meds:  aspirin  81 mg Oral Daily   atorvastatin  80 mg Oral QHS   clopidogrel  75 mg Oral Daily   docusate sodium  100 mg Oral BID   insulin aspart  0-15 Units Subcutaneous TID WC   metoprolol succinate  50 mg Oral Daily   pantoprazole  40 mg Oral Daily    PRN meds: acetaminophen, alum & mag hydroxide-simeth, bisacodyl, diphenhydrAMINE, hydrALAZINE, HYDROmorphone (DILAUDID) injection, menthol-cetylpyridinium **OR** phenol, methocarbamol **OR** methocarbamol (ROBAXIN) IV, metoCLOPramide **OR** metoCLOPramide (REGLAN) injection, ondansetron **OR** ondansetron (ZOFRAN) IV, oxyCODONE, oxyCODONE   Antimicrobials: Anti-infectives (From admission, onward)    Start     Dose/Rate Route Frequency Ordered Stop   02/18/21 2230  ceFAZolin (ANCEF) IVPB 1 g/50 mL premix        1 g 100 mL/hr over 30 Minutes Intravenous Every 6 hours 02/18/21 1917 02/19/21 1126   02/18/21 1610  ceFAZolin (ANCEF) 2-4 GM/100ML-% IVPB       Note to Pharmacy: Renda Rolls   : cabinet override      02/18/21 1610 02/19/21 0414       Objective: Vitals:   02/19/21 0746 02/19/21 1211  BP: 118/67 111/69  Pulse: 85 78  Resp: 17 17  Temp: 98.1 F (36.7 C) 97.9 F (36.6 C)  SpO2: 97% 92%    Intake/Output Summary (Last 24 hours) at 02/19/2021 1322 Last data filed at 02/19/2021 1151 Gross per 24 hour  Intake 544.91 ml  Output 2925 ml  Net -2380.09 ml   Filed Weights   02/17/21 1204  Weight: 81.6 kg   Weight change:  Body mass index is 25.1 kg/m.   Physical Exam: General exam: Pleasant, elderly Caucasian male.  Not in physical distress.  Pain controlled Skin: No rashes, lesions or ulcers. HEENT: Atraumatic, normocephalic, no obvious bleeding Lungs: Clear to auscultation bilaterally CVS: Regular rate and rhythm, no murmur GI/Abd soft, nontender, nondistended, bowel sound present CNS: Alert, awake, with x3 Psychiatry: Mood appropriate Extremities: No pedal edema, no calf  tenderness  Data Review: I have personally reviewed the laboratory data and studies available.  F/u labs ordered Unresulted Labs (From admission, onward)     Start     Ordered   02/19/21 0836  Osmolality  Add-on,   AD        02/19/21 0835   02/19/21 0836  Osmolality, urine  Once,   R        02/19/21 0835   02/19/21 0500  CBC with Differential/Platelet  Daily,   R      02/18/21 1058   02/19/21 3545  Basic metabolic panel  Daily,   R      02/18/21 1058            Signed, Terrilee Croak, MD [facility] 02/19/2021

## 2021-02-19 NOTE — Op Note (Signed)
NAMEALVY, ALSOP MEDICAL RECORD NO: [id number] ACCOUNT NO: [id number] DATE OF BIRTH: Oct 10, 1945 FACILITY: [facility] LOCATION: [facility] PHYSICIAN: Lind Guest. Ninfa Linden, MD  Operative Report   DATE OF PROCEDURE: 02/18/2021  PREOPERATIVE DIAGNOSIS:  Right hip traumatic displaced femoral neck fracture.  POSTOPERATIVE DIAGNOSIS:  Right hip traumatic displaced femoral neck fracture.  PROCEDURE:  Right total hip arthroplasty through direct anterior approach.  IMPLANTS:  [employer] Gription acetabular component, size 58 with a single screw, size 36+0 neutral polyethylene liner, size 6 ACTIS femoral component with high offset, size 36+8.5 metal hip ball.  SURGEON:  Lind Guest. Ninfa Linden, MD  ASSISTANT: Erskine Emery, PA-C.  ANESTHESIA:  General.  ANTIBIOTICS:  2 g IV Ancef.  ESTIMATED BLOOD LOSS:  124 mL  COMPLICATIONS:  None.  INDICATIONS:  The patient is a 75 year old gentleman who sustained an unfortunate mechanical fall yesterday injuring his right hip.  He was seen in the Emergency Room and found to have a right hip displaced femoral neck fracture.  He is very high  functioning 75 year old gentleman.  He is on Plavix and has had no previous right hip issues at all.  Given the displaced nature of the fracture, we have recommended total hip arthroplasty through direct anterior approach.  We had a long and thorough  discussion about the risks and benefits of the surgery and we talked about the risk of acute blood loss anemia, nerve or vessel injury, fracture, infection, dislocation, DVT, implant failure and skin and soft tissues issues as well as leg length  differences.  We talked about our goals being decreased pain, improve mobility and overall improve quality of life.  DESCRIPTION OF PROCEDURE:  After informed consent was obtained, appropriate right hip was marked. He was brought to the operating room where general anesthesia was obtained while he was on the stretcher.  Traction  boots were placed on both his feet.   Next, he was placed supine on the Hana fracture table, the perineal post in place and both legs in line skeletal traction device and no traction applied.  His right operative hip was prepped and draped with DuraPrep and sterile drapes.  A timeout was  called and he was identified as correct patient, correct right hip.  We then made an incision just inferior and posterior to the anterior superior iliac spine and carried this obliquely down the leg.  We dissected down tensor fascia lata muscle.  Tensor  fascia was then divided longitudinally to proceed with direct anterior approach to the hip.  We identified and cauterized circumflex vessels and identified the hip capsule, opened the hip capsule in L-type format finding a big hemarthrosis and a  displaced femoral neck fracture.  We then made a femoral neck cut with an oscillating saw just distal to the fracture or proximal to the lesser trochanter with an oscillating saw and completed this with an osteotome. We removed remnants of the fractured  femoral neck, which we did again found it completely displaced.  We placed a corkscrew guide in the femoral head and removed the femoral head in its entirety.  I then removed remnants of acetabular labrum and other debris and began reaming under direct  visualization from a size 43 reamer in stepwise increments going all the way up to a size 57 reamer with all reamers placed under direct visualization, the last reamer was also placed under direct fluoroscopy, so we could obtain our depth of reaming by  our inclination and anteversion.  I then placed a  Runner, broadcasting/film/video Gription acetabular component, size 58 and a single screw and I went with a 36+0 neutral polyethylene liner for that size acetabular component.  Attention was then turned to the femur.   With the leg externally rotated to 120 degrees and adducted, we were able to place a Mueller retractor medially and Hohman  retractor behind the greater trochanter.  We released lateral joint capsule and used a box cutting osteotome to enter the femoral  canal and a rongeur to lateralize, then began broaching using the ACTIS broaching system from a size 0 going all the way up to size 6. With the size 6 in place, we trialed a high offset femoral neck and a 36+1.5 head ball, reduced this in the acetabulum  and right away we knew we needed more leg length.  We dislocated the hip, removed the trial components.  We placed the real ACTIS femoral component with high offset size 6 and went with a 36+8.5 metal hip ball, reduced this in the acetabulum and it was  nice and tight.  We were pleased with leg length, offset, range of motion and stability assessed radiographically and mechanically.  We then irrigated the soft tissue with normal saline solution using pulsatile lavage.  We closed the joint capsule with  interrupted #1 Ethibond suture followed by #1 Vicryl to close the tensor fascia.  0 Vicryl was used to close the deep tissue and 2-0 Vicryl was used to close the subcutaneous tissue.  The skin was closed with staples.  An Aquacel dressing was applied.   He was taken off of the Hana table and taken to recovery room in stable condition with all final counts being correct.  No complications noted.  Of note, Benita Stabile, PA-C, did assist during the entire case and assistance was crucial for facilitating all aspects of this case.         PAA D: 02/18/2021 5:33:19 pm T: 02/19/2021 4:20:00 am  JOB: [id number]/ [id number]

## 2021-02-19 NOTE — Discharge Instructions (Signed)

## 2021-02-19 NOTE — Anesthesia Postprocedure Evaluation (Signed)
Anesthesia Post Note  Patient: Joel Gonzales.  Procedure(s) Performed: TOTAL HIP ARTHROPLASTY ANTERIOR APPROACH (Right: Hip)     Patient location during evaluation: PACU Anesthesia Type: General Level of consciousness: awake and alert Pain management: pain level controlled Vital Signs Assessment: post-procedure vital signs reviewed and stable Respiratory status: spontaneous breathing, nonlabored ventilation, respiratory function stable and patient connected to nasal cannula oxygen Cardiovascular status: blood pressure returned to baseline and stable Postop Assessment: no apparent nausea or vomiting Anesthetic complications: no   No notable events documented.               Effie Berkshire

## 2021-02-19 NOTE — Evaluation (Signed)
Physical Therapy Evaluation Patient Details Name: Joel Gonzales. MRN: [id number] DOB: [birth year] Today's Date: 02/19/2021  History of Present Illness  Patient is as a 75 yearold male S/P fall with subsequent hip fx. He underwent an anterior THA on 02/18/2021. He had a previous fall a month ago and suffered right sided rib fx. PMH CAD, DMII, CABGx6; cervical spondylosis,  Clinical Impression  Patient was able to ambulate 54' with a step to gait pattern. He reported stiffness but no significant pain. He required assist with bed mobility. He would benefit from skilled therapy to review going up and down steps. He has 4 steps into his house and his bedroom is on the second floor. Skilled therapy will continue to follow.      Recommendations for follow up therapy are one component of a multi-disciplinary discharge planning process, led by the attending physician.  Recommendations may be updated based on patient status, additional functional criteria and insurance authorization.  Follow Up Recommendations Follow physician's recommendations for discharge plan and follow up therapies    Assistance Recommended at Discharge Frequent or constant Supervision/Assistance  Functional Status Assessment Patient has had a recent decline in their functional status and demonstrates the ability to make significant improvements in function in a reasonable and predictable amount of time.  Equipment Recommendations  Rolling walker (2 wheels);BSC/3in1    Recommendations for Other Services Rehab consult     Precautions / Restrictions Precautions Precautions: Fall Restrictions Weight Bearing Restrictions: Yes RLE Weight Bearing: Weight bearing as tolerated      Mobility  Bed Mobility Overal bed mobility: Needs Assistance Bed Mobility: Supine to Sit     Supine to sit: Min assist     General bed mobility comments: min a to assist right LE and min a to sit up at the edge of the bed.     Transfers Overall transfer level: Needs assistance   Transfers: Sit to/from Stand Sit to Stand: Min guard           General transfer comment: min guard for intial balance    Ambulation/Gait Ambulation/Gait assistance: Min guard Gait Distance (Feet): 75 Feet Assistive device: Rolling walker (2 wheels) Gait Pattern/deviations: Step-to pattern Gait velocity: decreased Gait velocity interpretation: <1.31 ft/sec, indicative of household ambulator   General Gait Details: Patient reported stiffness in his hip but no pain  Stairs            Wheelchair Mobility    Modified Rankin (Stroke Patients Only)       Balance Overall balance assessment: Needs assistance Sitting-balance support: No upper extremity supported;Feet supported Sitting balance-Leahy Scale: Good     Standing balance support: Bilateral upper extremity supported Standing balance-Leahy Scale: Poor                               Pertinent Vitals/Pain Pain Assessment: Faces Faces Pain Scale: Hurts little more Pain Descriptors / Indicators: Aching Pain Intervention(s): Limited activity within patient's tolerance;Monitored during session;Repositioned    Home Living Family/patient expects to be discharged to:: Private residence Living Arrangements: Spouse/significant other Available Help at Discharge: Family Type of Home: House Home Access: Stairs to enter Entrance Stairs-Rails: Left Entrance Stairs-Number of Steps: 4 Alternate Level Stairs-Number of Steps: flight Home Layout: Two level;Bed/bath upstairs Home Equipment: None      Prior Function Prior Level of Function : Independent/Modified Independent             Mobility Comments:  Patient was active prior to his fall       Hand Dominance   Dominant Hand: Right    Extremity/Trunk Assessment   Upper Extremity Assessment Upper Extremity Assessment: Overall WFL for tasks assessed    Lower Extremity Assessment Lower  Extremity Assessment: RLE deficits/detail RLE Deficits / Details: required assist to move right LE RLE: Unable to fully assess due to pain RLE Sensation: WNL RLE Coordination: WNL    Cervical / Trunk Assessment Cervical / Trunk Assessment: Normal  Communication      Cognition Arousal/Alertness: Lethargic Behavior During Therapy: WFL for tasks assessed/performed Overall Cognitive Status: Within Functional Limits for tasks assessed                                          General Comments      Exercises Total Joint Exercises Ankle Circles/Pumps: 10 reps Quad Sets: 10 reps Gluteal Sets: 10 reps   Assessment/Plan    PT Assessment Patient needs continued PT services  PT Problem List Decreased strength;Decreased range of motion;Decreased activity tolerance;Decreased balance;Decreased mobility;Decreased knowledge of use of DME;Pain       PT Treatment Interventions Gait training;DME instruction;Stair training;Functional mobility training;Therapeutic activities;Therapeutic exercise;Neuromuscular re-education;Patient/family education;Manual techniques    PT Goals (Current goals can be found in the Care Plan section)  Acute Rehab PT Goals Patient Stated Goal: to go home PT Goal Formulation: With patient Time For Goal Achievement: 02/26/21 Potential to Achieve Goals: Good    Frequency 7X/week   Barriers to discharge        Co-evaluation               AM-PAC PT "6 Clicks" Mobility  Outcome Measure Help needed turning from your back to your side while in a flat bed without using bedrails?: A Lot Help needed moving from lying on your back to sitting on the side of a flat bed without using bedrails?: A Little Help needed moving to and from a bed to a chair (including a wheelchair)?: A Little Help needed standing up from a chair using your arms (e.g., wheelchair or bedside chair)?: A Little Help needed to walk in hospital room?: A Little Help needed  climbing 3-5 steps with a railing? : A Lot 6 Click Score: 16    End of Session Equipment Utilized During Treatment: Gait belt Activity Tolerance: Patient tolerated treatment well Patient left: in chair;with call bell/phone within reach Nurse Communication: Mobility status PT Visit Diagnosis: Unsteadiness on feet (R26.81);Other abnormalities of gait and mobility (R26.89);Muscle weakness (generalized) (M62.81);Pain Pain - Right/Left: Right Pain - part of body: Hip    Time: 0915-0950 PT Time Calculation (min) (ACUTE ONLY): 35 min   Charges:   PT Evaluation $PT Eval Moderate Complexity: 1 Mod            Carney Living PT DPT  02/19/2021, 10:47 AM

## 2021-02-20 DIAGNOSIS — S72001A Fracture of unspecified part of neck of right femur, initial encounter for closed fracture: Secondary | ICD-10-CM | POA: Diagnosis not present

## 2021-02-20 LAB — BASIC METABOLIC PANEL
Anion gap: 10 (ref 5–15)
BUN: 31 mg/dL — ABNORMAL HIGH (ref 8–23)
CO2: 25 mmol/L (ref 22–32)
Calcium: 8.6 mg/dL — ABNORMAL LOW (ref 8.9–10.3)
Chloride: 97 mmol/L — ABNORMAL LOW (ref 98–111)
Creatinine, Ser: 1.48 mg/dL — ABNORMAL HIGH (ref 0.61–1.24)
GFR, Estimated: 49 mL/min — ABNORMAL LOW (ref >60)
Glucose, Bld: 149 mg/dL — ABNORMAL HIGH (ref 70–99)
Potassium: 3.8 mmol/L (ref 3.5–5.1)
Sodium: 132 mmol/L — ABNORMAL LOW (ref 135–145)

## 2021-02-20 LAB — CBC WITH DIFFERENTIAL/PLATELET
Abs Immature Granulocytes: 0.06 10*3/uL (ref 0.00–0.07)
Basophils Absolute: 0 10*3/uL (ref 0.0–0.1)
Basophils Relative: 0 %
Eosinophils Absolute: 0.1 10*3/uL (ref 0.0–0.5)
Eosinophils Relative: 1 %
HCT: 31.3 % — ABNORMAL LOW (ref 39.0–52.0)
Hemoglobin: 10.9 g/dL — ABNORMAL LOW (ref 13.0–17.0)
Immature Granulocytes: 1 %
Lymphocytes Relative: 12 %
Lymphs Abs: 1.3 10*3/uL (ref 0.7–4.0)
MCH: 32.2 pg (ref 26.0–34.0)
MCHC: 34.8 g/dL (ref 30.0–36.0)
MCV: 92.6 fL (ref 80.0–100.0)
Monocytes Absolute: 0.7 10*3/uL (ref 0.1–1.0)
Monocytes Relative: 7 %
Neutro Abs: 8.5 10*3/uL — ABNORMAL HIGH (ref 1.7–7.7)
Neutrophils Relative %: 79 %
Platelets: 195 10*3/uL (ref 150–400)
RBC: 3.38 MIL/uL — ABNORMAL LOW (ref 4.22–5.81)
RDW: 13.5 % (ref 11.5–15.5)
WBC: 10.7 10*3/uL — ABNORMAL HIGH (ref 4.0–10.5)
nRBC: 0 % (ref 0.0–0.2)

## 2021-02-20 LAB — GLUCOSE, CAPILLARY
Glucose-Capillary: 249 mg/dL — ABNORMAL HIGH (ref 70–99)
Glucose-Capillary: 181 mg/dL — ABNORMAL HIGH (ref 70–99)
Glucose-Capillary: 160 mg/dL — ABNORMAL HIGH (ref 70–99)
Glucose-Capillary: 165 mg/dL — ABNORMAL HIGH (ref 70–99)

## 2021-02-20 MED ORDER — SODIUM CHLORIDE 0.9 % IV SOLN: INTRAVENOUS | Status: DC

## 2021-02-20 MED ORDER — PIOGLITAZONE HCL 15 MG PO TABS
15.0000 mg | ORAL_TABLET | Freq: Every day | ORAL | Status: DC
  Administered 2021-02-20 – 2021-02-22 (×3): 15 mg via ORAL
  Filled 2021-02-20 (×3): qty 1

## 2021-02-20 MED ORDER — EMPAGLIFLOZIN 25 MG PO TABS
25.0000 mg | ORAL_TABLET | Freq: Every day | ORAL | Status: DC
  Administered 2021-02-20 – 2021-02-22 (×3): 25 mg via ORAL
  Filled 2021-02-20 (×3): qty 1

## 2021-02-20 NOTE — Progress Notes (Signed)
Physical Therapy Treatment Patient Details Name: Joel Gonzales. MRN: [id number] DOB: 1946/03/01 Today's Date: 02/20/2021   History of Present Illness Patient is as a 75 yearold male S/P fall with subsequent hip fx. He underwent an anterior THA on 02/18/2021. He had a previous fall a month ago and suffered right sided rib fx. PMH CAD, DMII, CABGx6; cervical spondylosis,    PT Comments    Continuing work on functional mobility and activity tolerance;  Excellent participation in therex for the R hip, and showing good hip control; Initially plan was to get going on progressive amb, however pt reported not feeling well sitting up on EOB, and opted to get serial BPs as noted in previous note; DBP with a significant drop in initial standing, leading to a drop in MAP and incr in HR; this, simultaneous with reports of dizziness, feeling lousy, and stating he doesn't feel well enough to walk; We discussed factors that can lead to postural hypotension, and options to help; notified pt's RN and Dr. Pietro Cassis;  I anticipate good progress once his standing/upright activity tolerance improves     Recommendations for follow up therapy are one component of a multi-disciplinary discharge planning process, led by the attending physician.  Recommendations may be updated based on patient status, additional functional criteria and insurance authorization.  Follow Up Recommendations  Other (comment) (Will need to get his standing BPs and standing activity tolerance more stable, and at this moment, going home would not be safe; Noted CIR is considering him, and I value the rehab quality CIR porvides  Still, I do anticipate that once his BP is stabilized, he will progress quite quickly, and will likely be able to dc home with [facility] PT/OT follow up)     Assistance Recommended at Discharge Frequent or constant Supervision/Assistance  Equipment Recommendations  Rolling walker (2 wheels);BSC/3in1    Recommendations for  Other Services       Precautions / Restrictions Precautions Precautions: Fall Precaution Comments: watch orthostatic BPs Restrictions Weight Bearing Restrictions: No RLE Weight Bearing: Weight bearing as tolerated     Mobility  Bed Mobility Overal bed mobility: Needs Assistance Bed Mobility: Supine to Sit     Supine to sit: Min assist     General bed mobility comments: Min handheld assist to pull to sit    Transfers Overall transfer level: Needs assistance Equipment used: Rolling walker (2 wheels) Transfers: Sit to/from Stand Sit to Stand: Min guard           General transfer comment: min guard for intial balance; cues to self-monitor for activity tolerance    Ambulation/Gait Ambulation/Gait assistance: Min guard Gait Distance (Feet): 2 Feet (pivotal steps bed to chair) Assistive device: Rolling walker (2 wheels) Gait Pattern/deviations: Step-to pattern       General Gait Details: Cues to self-monitor for activity tolerance   Stairs             Wheelchair Mobility    Modified Rankin (Stroke Patients Only)       Balance     Sitting balance-Leahy Scale: Good       Standing balance-Leahy Scale: Poor                              Cognition Arousal/Alertness: Awake/alert Behavior During Therapy: WFL for tasks assessed/performed Overall Cognitive Status: Within Functional Limits for tasks assessed  Exercises Total Joint Exercises Quad Sets: AROM;Right;10 reps Gluteal Sets: AROM;Both;10 reps Towel Squeeze: AROM;Both;10 reps Short Arc Quad: AROM;Right;10 reps Heel Slides: AROM;Right;10 reps Hip ABduction/ADduction: AAROM;Right;10 reps    General Comments        Pertinent Vitals/Pain Pain Assessment: Faces Faces Pain Scale: Hurts little more Pain Location: R hip Pain Descriptors / Indicators: Aching Pain Intervention(s): Monitored during session;Premedicated  before session    Home Living                          Prior Function            PT Goals (current goals can now be found in the care plan section) Acute Rehab PT Goals Patient Stated Goal: to go home; but he wants to make sure he doesn't feel like passing out when he stands PT Goal Formulation: With patient Time For Goal Achievement: 02/26/21 Potential to Achieve Goals: Good Progress towards PT goals: Progressing toward goals (but slowly, limited by syncopal symptoms in standing)    Frequency    Min 6X/week      PT Plan Current plan remains appropriate;Discharge plan needs to be updated;Frequency needs to be updated    Co-evaluation              AM-PAC PT "6 Clicks" Mobility   Outcome Measure  Help needed turning from your back to your side while in a flat bed without using bedrails?: A Lot Help needed moving from lying on your back to sitting on the side of a flat bed without using bedrails?: A Little Help needed moving to and from a bed to a chair (including a wheelchair)?: A Little Help needed standing up from a chair using your arms (e.g., wheelchair or bedside chair)?: A Little Help needed to walk in hospital room?: A Lot Help needed climbing 3-5 steps with a railing? : A Lot 6 Click Score: 15    End of Session Equipment Utilized During Treatment: Gait belt Activity Tolerance: Patient tolerated treatment well Patient left: in chair;with call bell/phone within reach Nurse Communication: Mobility status;Other (comment) (and orthostasis) PT Visit Diagnosis: Unsteadiness on feet (R26.81);Other abnormalities of gait and mobility (R26.89);Muscle weakness (generalized) (M62.81);Pain Pain - Right/Left: Right Pain - part of body: Hip     Time: 1703-1753 (minus approx 10 minutes) PT Time Calculation (min) (ACUTE ONLY): 50 min  Charges:  $Therapeutic Exercise: 8-22 mins $Therapeutic Activity: 23-37 mins                     Roney Marion, PT   Acute Rehabilitation Services Pager [phone] Office [place] 02/20/2021, 6:11 PM

## 2021-02-20 NOTE — Plan of Care (Signed)

## 2021-02-20 NOTE — Progress Notes (Signed)
Physical Therapy Treatment Note  Reported dizziness while sitting EOB, so opted to take orthostatics;      02/20/21 1715 02/20/21 1720 02/20/21 1723  Vital Signs  Patient Position (if appropriate) Orthostatic Vitals  --   --   Orthostatic Sitting  BP- Sitting 115/72 (MAP 85) 111/60 (MAP 75) 137/69 (MAP 86)  Pulse- Sitting 79 (reports not quite feeling right) 76 (After standing 2-3 minutes; had to sit while trying to get 3 minute standing BP due to dizziness) 73  Orthostatic Standing at 0 minutes  BP- Standing at 0 minutes 123/57 (MAP 77)  --   --   Pulse- Standing at 0 minutes 88 (worsening feelings of dizziness)  --   --   Orthostatic Standing at 3 minutes  BP- Standing at 3 minutes  (Attempted and got close; unable to stand long enough to get full 3 min reading)  --   --     Full PT note to follow;   Roney Marion, [place] Pager [phone] Office [phone]

## 2021-02-20 NOTE — Progress Notes (Signed)
PROGRESS NOTE  Joel Gonzales.  DOB: 1945/08/17  PCP: Haywood Pao, MD FXT:[id number]  DOA: 02/17/2021  LOS: 3 days  Hospital Day: 4  Chief Complaint  Patient presents with   level 2 fall on thinners    Brief narrative: Joel Dhaliwal. is a 75 y.o. male with PMH significant for DM2, HTN, HLD, CAD s/p CABG about 20 years ago who lives at home with his wife and does not need any ambulatory assistance. Patient presented to the ED on 11/17 after mechanical fall leading to right hip pain.  He slipped and fell back hitting the right side of his body and hip while walking down the stairs.  Denies any head injury or loss of consciousness.  No premonition symptoms of lightheadedness or chest pain or shortness of breath. Of note, about a month ago he had 4 rib fractures on the right side after being pushed by his neighbors dog.  He states he recovered well from that injury.  Denies any gait imbalance or unsteadiness at baseline.  Imaging in the ED right sided femoral neck fracture.   Patient was admitted to hospitalist service Orthopedics consulted.  Subjective: Patient was seen and examined this morning.  Lying on bed.  Not in distress.  Pain controlled. Labs from this morning with sodium level improving. Pending CIR  Assessment/Plan: Right hip fracture secondary to mechanical fall -Seen by orthopedics.   -11/18, underwent right total hip arthroplasty. -Pain controlled  AKI on CKD 2 -Presented with creatinine elevated to 1.51.  Unclear baseline.  Creatinine remains stable at this time. Recent Labs    02/17/21 1149 02/18/21 0202 02/19/21 0135 02/20/21 0255  BUN 25* 25* 27* 31*  CREATININE 1.51* 1.41* 1.50* 1.48*   Type 2 diabetes mellitus -A1c 6.9 in 11/17 -Home meds include Synjardy XR 25/1000 mg daily, Actos 15 mg daily -Currently oral meds on hold.  On sliding scale insulin with Accu-Cheks. -Blood sugar level trending up, 249 this morning.  I will resume Actos and  Jardiance at this time.  Keep metformin on hold because of elevated creatinine. Recent Labs  Lab 02/19/21 0712 02/19/21 1113 02/19/21 1606 02/19/21 2050 02/20/21 0930  GLUCAP 157* 209* 183* 184* 249*   Essential hypertension -Home meds include metoprolol succinate 50 mg daily, ramipril 5 mg twice daily, spironolactone 12.5 mg daily and HCTZ 12.5 mg daily -Metoprolol continued.  Others on hold at this time.  Currently blood pressure remains controlled. -Hydralazine IV as needed  CAD s/p CABG several years ago History of stroke -Currently has no anginal symptoms.  No EKG changes. -Continue Plavix and statin   Hyponatremia -Mild, euvolemic, HCTZ on hold at this time. -Sodium level better today.  He had blood work done 3 weeks ago at his PCP and sodium level was 136 per family. -Hyponatremic likely because of poor oral intake for several days after fall. Recent Labs  Lab 02/17/21 1149 02/18/21 0202 02/19/21 0135 02/20/21 0255  NA 132* 129* 128* 132*   Recent multiple ribs fracture -Recovered well after that.  Currently respiratory status stable.     Mobility: Encourage ambulation.  Needs PT eval postprocedure Living condition: Was living at home with wife Goals of care:   Code Status: Full Code  Nutritional status: Body mass index is 25.1 kg/m. Nutrition Problem: Moderate Malnutrition Etiology: acute illness (rib fracture, hip fracture) Signs/Symptoms: mild fat depletion, mild muscle depletion Diet:  Diet Order  Diet regular Room service appropriate? Yes; Fluid consistency: Thin  Diet effective now                  DVT prophylaxis:  SCDs Start: 02/18/21 1917 Place TED hose Start: 02/18/21 1917   Antimicrobials: None Fluid: Off IV fluid today Consultants: Orthopedics Family Communication: None at bedside  Status is: Inpatient  Remains inpatient appropriate because: Pending CIR  Dispo: The patient is from: Home              Anticipated d/c is  to: CIR eval pending.              Patient currently is not medically stable to d/c.   Difficult to place patient No     Infusions:   sodium chloride 75 mL/hr at 02/18/21 2136   methocarbamol (ROBAXIN) IV      Scheduled Meds:  aspirin  81 mg Oral Daily   atorvastatin  80 mg Oral QHS   clopidogrel  75 mg Oral Daily   docusate sodium  100 mg Oral BID   empagliflozin  25 mg Oral Daily   insulin aspart  0-15 Units Subcutaneous TID WC   metoprolol succinate  50 mg Oral Daily   pantoprazole  40 mg Oral Daily   pioglitazone  15 mg Oral Daily    PRN meds: acetaminophen, alum & mag hydroxide-simeth, bisacodyl, diphenhydrAMINE, hydrALAZINE, HYDROmorphone (DILAUDID) injection, menthol-cetylpyridinium **OR** phenol, methocarbamol **OR** methocarbamol (ROBAXIN) IV, metoCLOPramide **OR** metoCLOPramide (REGLAN) injection, ondansetron **OR** ondansetron (ZOFRAN) IV, oxyCODONE, oxyCODONE   Antimicrobials: Anti-infectives (From admission, onward)    Start     Dose/Rate Route Frequency Ordered Stop   02/18/21 2230  ceFAZolin (ANCEF) IVPB 1 g/50 mL premix        1 g 100 mL/hr over 30 Minutes Intravenous Every 6 hours 02/18/21 1917 02/19/21 1126   02/18/21 1610  ceFAZolin (ANCEF) 2-4 GM/100ML-% IVPB       Note to Pharmacy: Renda Rolls   : cabinet override      02/18/21 1610 02/19/21 0414       Objective: Vitals:   02/20/21 0533 02/20/21 0800  BP: 123/75 138/71  Pulse: 87 92  Resp: 18 19  Temp: 97.9 F (36.6 C) 98 F (36.7 C)  SpO2: 97% 100%    Intake/Output Summary (Last 24 hours) at 02/20/2021 1148 Last data filed at 02/20/2021 0723 Gross per 24 hour  Intake 1024.91 ml  Output 1100 ml  Net -75.09 ml   Filed Weights   02/17/21 1204  Weight: 81.6 kg   Weight change:  Body mass index is 25.1 kg/m.   Physical Exam: General exam: Pleasant, elderly Caucasian male.  Not in physical distress.  Pain controlled Skin: No rashes, lesions or ulcers. HEENT: Atraumatic,  normocephalic, no obvious bleeding Lungs: Clear to auscultation bilaterally CVS: Regular rate and rhythm, no murmur GI/Abd soft, nontender, nondistended, bowel sound present CNS: Alert, awake, with x3 Psychiatry: Mood appropriate Extremities: No pedal edema, no calf tenderness  Data Review: I have personally reviewed the laboratory data and studies available.  F/u labs ordered Unresulted Labs (From admission, onward)     Start     Ordered   02/19/21 0500  CBC with Differential/Platelet  Daily,   R      02/18/21 1058   02/19/21 2353  Basic metabolic panel  Daily,   R      02/18/21 1058            Signed, Terrilee Croak, MD  [facility] 02/20/2021

## 2021-02-21 ENCOUNTER — Encounter (HOSPITAL_COMMUNITY): Payer: Self-pay | Admitting: Orthopaedic Surgery

## 2021-02-21 LAB — CBC WITH DIFFERENTIAL/PLATELET
Abs Immature Granulocytes: 0.04 10*3/uL (ref 0.00–0.07)
Basophils Absolute: 0.1 10*3/uL (ref 0.0–0.1)
Basophils Relative: 1 %
Eosinophils Absolute: 0.3 10*3/uL (ref 0.0–0.5)
Eosinophils Relative: 4 %
HCT: 26.4 % — ABNORMAL LOW (ref 39.0–52.0)
Hemoglobin: 9.2 g/dL — ABNORMAL LOW (ref 13.0–17.0)
Immature Granulocytes: 1 %
Lymphocytes Relative: 17 %
Lymphs Abs: 1.2 10*3/uL (ref 0.7–4.0)
MCH: 32.2 pg (ref 26.0–34.0)
MCHC: 34.8 g/dL (ref 30.0–36.0)
MCV: 92.3 fL (ref 80.0–100.0)
Monocytes Absolute: 0.7 10*3/uL (ref 0.1–1.0)
Monocytes Relative: 10 %
Neutro Abs: 4.9 10*3/uL (ref 1.7–7.7)
Neutrophils Relative %: 67 %
Platelets: 177 10*3/uL (ref 150–400)
RBC: 2.86 MIL/uL — ABNORMAL LOW (ref 4.22–5.81)
RDW: 13.6 % (ref 11.5–15.5)
WBC: 7.2 10*3/uL (ref 4.0–10.5)
nRBC: 0 % (ref 0.0–0.2)

## 2021-02-21 LAB — GLUCOSE, CAPILLARY
Glucose-Capillary: 123 mg/dL — ABNORMAL HIGH (ref 70–99)
Glucose-Capillary: 187 mg/dL — ABNORMAL HIGH (ref 70–99)
Glucose-Capillary: 139 mg/dL — ABNORMAL HIGH (ref 70–99)
Glucose-Capillary: 244 mg/dL — ABNORMAL HIGH (ref 70–99)

## 2021-02-21 LAB — BASIC METABOLIC PANEL
Anion gap: 10 (ref 5–15)
BUN: 25 mg/dL — ABNORMAL HIGH (ref 8–23)
CO2: 25 mmol/L (ref 22–32)
Calcium: 8.4 mg/dL — ABNORMAL LOW (ref 8.9–10.3)
Chloride: 98 mmol/L (ref 98–111)
Creatinine, Ser: 1.23 mg/dL (ref 0.61–1.24)
GFR, Estimated: >60 mL/min (ref >60)
Glucose, Bld: 123 mg/dL — ABNORMAL HIGH (ref 70–99)
Potassium: 3.8 mmol/L (ref 3.5–5.1)
Sodium: 133 mmol/L — ABNORMAL LOW (ref 135–145)

## 2021-02-21 MED ORDER — OXYCODONE HCL 5 MG PO TABS
5.0000 mg | ORAL_TABLET | Freq: Four times a day (QID) | ORAL | 0 refills | Status: DC | PRN
Start: 2021-02-21 — End: 2021-10-18

## 2021-02-21 MED ORDER — METHOCARBAMOL 500 MG PO TABS: 500.0000 mg | ORAL_TABLET | Freq: Four times a day (QID) | ORAL | 1 refills | Status: DC | PRN

## 2021-02-21 MED ORDER — ASPIRIN 81 MG PO CHEW: 81.0000 mg | CHEWABLE_TABLET | Freq: Every day | ORAL | 0 refills | Status: DC

## 2021-02-21 NOTE — Care Management Important Message (Signed)
Important Message  Patient Details  Name: Joel Gonzales. MRN: [id number] Date of Birth: 1946-01-09   Medicare Important Message Given:  Yes     Joetta Manners 02/21/2021, 12:55 PM

## 2021-02-21 NOTE — Progress Notes (Signed)
Inpatient Rehab Admissions Coordinator:   CIR consult received. Note that Pt.is already min guard, CIR beds are limited this week,  and Pt. Has unclear medical necessity for CIR. Pt. Is likely to progress to being able to go home with continued work with acute therapies. If pt.'s medical statis changes or if he experiences a regression in functional abilities, CIR can re-evaluate.   Clemens Catholic, [place], [place] Admissions Coordinator  [phone] ([place]) [phone] (office)

## 2021-02-21 NOTE — Progress Notes (Signed)
PROGRESS NOTE  Joel Gonzales.  DOB: 1945-10-10  PCP: Joel Pao, MD SWF:[id number]  DOA: 02/17/2021  LOS: 4 days  Hospital Day: 5  Chief Complaint  Patient presents with   level 2 fall on thinners    Brief narrative: Joel Gonzales. is a 75 y.o. male with PMH significant for DM2, HTN, HLD, CAD s/p CABG about 20 years ago who lives at home with his wife and does not need any ambulatory assistance. Patient presented to the ED on 11/17 after mechanical fall leading to right hip pain.  He slipped and fell back hitting the right side of his body and hip while walking down the stairs.  Denies any head injury or loss of consciousness.  No premonition symptoms of lightheadedness or chest pain or shortness of breath. Of note, about a month ago he had 4 rib fractures on the right side after being pushed by his neighbors dog.  He states he recovered well from that injury.  Denies any gait imbalance or unsteadiness at baseline.  Imaging in the ED showed right sided femoral neck fracture.   Patient was admitted to hospitalist service Orthopedics consulted.  Subjective: Patient was seen and examined this morning.  Sitting up in chair.  Not in distress.  Wife at bedside. He was reportedly orthostatic with physical therapy yesterday.  Assessment/Plan: Right hip fracture secondary to mechanical fall -Seen by orthopedics.   -11/18, underwent right total hip arthroplasty. -Pain controlled -On aspirin for DVT prophylaxis.  Orthostatic hypotension History of essential hypertension -On 11/20, patient had orthostatic hypotension with physical therapy.  He was started on IV fluid. -With IV hydration, blood pressure improved, creatinine improved.  He was able to participate with therapy today without having any dizziness or orthostatic drop. -He is Home meds include metoprolol succinate 50 mg daily, ramipril 5 mg twice daily, spironolactone 12.5 mg daily and HCTZ 12.5 mg daily -Currently  he is on metoprolol only.  Other BP meds are on hold.  AKI on CKD 2 -Presented with creatinine elevated to 1.51.  Unclear baseline.  Creatinine improved in last 24 hours with IV fluid. Recent Labs    02/17/21 1149 02/18/21 0202 02/19/21 0135 02/20/21 0255 02/21/21 0409  BUN 25* 25* 27* 31* 25*  CREATININE 1.51* 1.41* 1.50* 1.48* 1.23    Type 2 diabetes mellitus -A1c 6.9 in 11/17 -Home meds include Synjardy XR 25/1000 mg daily, Actos 15 mg daily -Currently on Jardiance and Actos.  Metformin on hold because of AKI.  We plan to resume metformin tomorrow. Recent Labs  Lab 02/20/21 1224 02/20/21 1641 02/20/21 2022 02/21/21 0804 02/21/21 1149  GLUCAP 181* 160* 165* 123* 187*    CAD s/p CABG several years ago History of stroke -Currently has no anginal symptoms.  No EKG changes. -Continue Plavix and statin.  Aspirin added for DVT prophylaxis.   Hyponatremia -Mild, euvolemic, HCTZ on hold at this time. -Sodium level better today.  He had blood work done 3 weeks ago at his PCP and sodium level was 136 per family. -Hyponatremic likely because of poor oral intake for several days after fall. Recent Labs  Lab 02/17/21 1149 02/18/21 0202 02/19/21 0135 02/20/21 0255 02/21/21 0409  NA 132* 129* 128* 132* 133*    Recent multiple ribs fracture -Recovered well after that.  Currently respiratory status stable.     Mobility: Encourage ambulation.  Needs PT eval postprocedure Living condition: Was living at home with wife Goals of care:   Code  Status: Full Code  Nutritional status: Body mass index is 25.1 kg/m. Nutrition Problem: Moderate Malnutrition Etiology: acute illness (rib fracture, hip fracture) Signs/Symptoms: mild fat depletion, mild muscle depletion Diet:  Diet Order             Diet regular Room service appropriate? Yes; Fluid consistency: Thin  Diet effective now                  DVT prophylaxis:  SCDs Start: 02/18/21 1917 Place TED hose Start:  02/18/21 1917   Antimicrobials: None Fluid: Can continue IV fluid till tomorrow Consultants: Orthopedics Family Communication: None at bedside  Status is: Inpatient  Remains inpatient appropriate because: Pending CIR  Dispo: The patient is from: Home              Anticipated d/c is to: Home with home health tomorrow              Patient currently is not medically stable to d/c.   Difficult to place patient No     Infusions:   sodium chloride 75 mL/hr at 02/18/21 2136   sodium chloride 75 mL/hr at 02/21/21 0736   methocarbamol (ROBAXIN) IV      Scheduled Meds:  aspirin  81 mg Oral Daily   atorvastatin  80 mg Oral QHS   clopidogrel  75 mg Oral Daily   docusate sodium  100 mg Oral BID   empagliflozin  25 mg Oral Daily   insulin aspart  0-15 Units Subcutaneous TID WC   metoprolol succinate  50 mg Oral Daily   pantoprazole  40 mg Oral Daily   pioglitazone  15 mg Oral Daily    PRN meds: acetaminophen, alum & mag hydroxide-simeth, bisacodyl, diphenhydrAMINE, hydrALAZINE, HYDROmorphone (DILAUDID) injection, menthol-cetylpyridinium **OR** phenol, methocarbamol **OR** methocarbamol (ROBAXIN) IV, metoCLOPramide **OR** metoCLOPramide (REGLAN) injection, ondansetron **OR** ondansetron (ZOFRAN) IV, oxyCODONE, oxyCODONE   Antimicrobials: Anti-infectives (From admission, onward)    Start     Dose/Rate Route Frequency Ordered Stop   02/18/21 2230  ceFAZolin (ANCEF) IVPB 1 g/50 mL premix        1 g 100 mL/hr over 30 Minutes Intravenous Every 6 hours 02/18/21 1917 02/19/21 1126   02/18/21 1610  ceFAZolin (ANCEF) 2-4 GM/100ML-% IVPB       Note to Pharmacy: Renda Rolls   : cabinet override      02/18/21 1610 02/19/21 0414       Objective: Vitals:   02/21/21 0805 02/21/21 0952  BP: 133/69   Pulse: 86   Resp: 15 20  Temp: 98.2 F (36.8 C)   SpO2: 97%     Intake/Output Summary (Last 24 hours) at 02/21/2021 1339 Last data filed at 02/21/2021 1221 Gross per 24 hour   Intake 1063.85 ml  Output 2175 ml  Net -1111.15 ml    Filed Weights   02/17/21 1204  Weight: 81.6 kg   Weight change:  Body mass index is 25.1 kg/m.   Physical Exam: General exam: Pleasant, elderly Caucasian male.  Not in physical distress.  Pain controlled Skin: No rashes, lesions or ulcers. HEENT: Atraumatic, normocephalic, no obvious bleeding Lungs: Clear to auscultation bilaterally CVS: Regular rate and rhythm, no murmur GI/Abd soft, nontender, nondistended, bowel sound present CNS: Alert, awake, oriented x3. Psychiatry: Mood appropriate Extremities: No pedal edema, no calf tenderness  Data Review: I have personally reviewed the laboratory data and studies available.  F/u labs ordered Unresulted Labs (From admission, onward)    None  Signed, Terrilee Croak, MD [facility] 02/21/2021

## 2021-02-21 NOTE — Progress Notes (Signed)
Mobility Specialist Criteria Algorithm Info.  02/21/21 1535  Mobility  Activity Ambulated in hall  Range of Motion/Exercises Active;All extremities  Level of Assistance Standby assist, set-up cues, supervision of patient - no hands on  Assistive Device Front wheel walker  RLE Weight Bearing WBAT  Distance Ambulated (ft) 100 ft  Mobility Ambulated with assistance in hallway  Mobility Response Tolerated well  Mobility performed by Mobility specialist  Bed Position Semi-fowlers       Patient received lying in bed willing to participate in mobility. Required min A to EOB and stood with supervision. Complained of 6/10 pain in RLE, despite pain eager to continue. Ambulated in hallway with slow steady gait. Upon returning to room declined sitting in recliner chair, required min A to assist LE's back into bed. Tolerated well without incident and was left with all needs met.   02/21/2021 3:58 PM

## 2021-02-21 NOTE — Progress Notes (Signed)
Physical Therapy Treatment Patient Details Name: Joel Gonzales. MRN: [id number] DOB: [birth year] Today's Date: 02/21/2021   History of Present Illness Patient is as a 75 yearold male S/P fall with subsequent hip fx. He underwent an anterior THA on 02/18/2021. He had a previous fall a month ago and suffered right sided rib fx. PMH CAD, DMII, CABGx6; cervical spondylosis,    PT Comments    Pt up in recliner on arrival. Performed LE exercises with feet elevated in recliner. Min guard assist sit to stand, and min guard assist ambulation 100' with RW. Pt presents with slow, antalgic gait. Deficits noted in activity tolerance, strength, and balance. Pt returned to recliner at end of session.    Recommendations for follow up therapy are one component of a multi-disciplinary discharge planning process, led by the attending physician.  Recommendations may be updated based on patient status, additional functional criteria and insurance authorization.  Follow Up Recommendations  Acute inpatient rehab (3hours/day)     Assistance Recommended at Discharge Frequent or constant Supervision/Assistance  Equipment Recommendations  Rolling walker (2 wheels);BSC/3in1    Recommendations for Other Services Rehab consult     Precautions / Restrictions Precautions Precautions: Fall Precaution Comments: watch orthostatic BPs (BP stable 11/21) Restrictions Weight Bearing Restrictions: Yes RLE Weight Bearing: Weight bearing as tolerated     Mobility  Bed Mobility               General bed mobility comments: pt up in chair upon arrival    Transfers Overall transfer level: Needs assistance Equipment used: Rolling walker (2 wheels) Transfers: Sit to/from Stand Sit to Stand: Min guard           General transfer comment: cues for sequencing    Ambulation/Gait Ambulation/Gait assistance: Min guard Gait Distance (Feet): 100 Feet Assistive device: Rolling walker (2 wheels) Gait  Pattern/deviations: Step-through pattern;Decreased stride length;Antalgic Gait velocity: decreased Gait velocity interpretation: <1.31 ft/sec, indicative of household ambulator   General Gait Details: cues for posture and sequencing   Stairs             Wheelchair Mobility    Modified Rankin (Stroke Patients Only)       Balance Overall balance assessment: Needs assistance Sitting-balance support: No upper extremity supported;Feet supported Sitting balance-Leahy Scale: Normal     Standing balance support: Bilateral upper extremity supported;During functional activity;No upper extremity supported Standing balance-Leahy Scale: Fair Standing balance comment: static stand without support, reliant on RW for amb                            Cognition Arousal/Alertness: Awake/alert Behavior During Therapy: [facility] for tasks assessed/performed Overall Cognitive Status: Within Functional Limits for tasks assessed                                          Exercises Total Joint Exercises Ankle Circles/Pumps: AROM;Both;10 reps Quad Sets: AROM;Both;10 reps Heel Slides: AAROM;Right;10 reps Hip ABduction/ADduction: AAROM;Right;10 reps    General Comments General comments (skin integrity, edema, etc.): VSS on RA      Pertinent Vitals/Pain Pain Assessment: 0-10 Pain Score: 3  Faces Pain Scale: Hurts a little bit Pain Location: R hip Pain Descriptors / Indicators: Discomfort Pain Intervention(s): Monitored during session;Repositioned    Home Living Family/patient expects to be discharged to:: Private residence Living Arrangements: Spouse/significant other Available  Help at Discharge: Family Type of Home: House Home Access: Stairs to enter Entrance Stairs-Rails: Left Entrance Stairs-Number of Steps: 4 Alternate Level Stairs-Number of Steps: flight Home Layout: Two level;Bed/bath upstairs Home Equipment: None      Prior Function             PT Goals (current goals can now be found in the care plan section) Acute Rehab PT Goals Patient Stated Goal: rehab then home Progress towards PT goals: Progressing toward goals    Frequency    Min 6X/week      PT Plan Current plan remains appropriate    Co-evaluation              AM-PAC PT "6 Clicks" Mobility   Outcome Measure  Help needed turning from your back to your side while in a flat bed without using bedrails?: A Lot Help needed moving from lying on your back to sitting on the side of a flat bed without using bedrails?: A Little Help needed moving to and from a bed to a chair (including a wheelchair)?: A Little Help needed standing up from a chair using your arms (e.g., wheelchair or bedside chair)?: A Little Help needed to walk in hospital room?: A Little Help needed climbing 3-5 steps with a railing? : A Lot 6 Click Score: 16    End of Session Equipment Utilized During Treatment: Gait belt Activity Tolerance: Patient tolerated treatment well Patient left: in chair;with call bell/phone within reach;with family/visitor present Nurse Communication: Mobility status PT Visit Diagnosis: Unsteadiness on feet (R26.81);Other abnormalities of gait and mobility (R26.89);Muscle weakness (generalized) (M62.81);Pain Pain - Right/Left: Right Pain - part of body: Hip     Time: 9622-2979 PT Time Calculation (min) (ACUTE ONLY): 34 min  Charges:  $Gait Training: 8-22 mins $Therapeutic Exercise: 8-22 mins                     Lorrin Goodell, PT  Office # [phone] Pager [phone]    Lorriane Shire 02/21/2021, 11:19 AM

## 2021-02-21 NOTE — Progress Notes (Signed)
Subjective: 3 Days Post-Op Procedure(s) (LRB): TOTAL HIP ARTHROPLASTY ANTERIOR APPROACH (Right) Patient reports pain as moderate.  Vitals stable. Acute blood loss anemia from surgery, but tolerating well.  Back on Plavix and also added a baby aspirin once daily for DVT coverage.  Working on mobility with therapy.  Objective: Vital signs in last 24 hours: Temp:  [98 F (36.7 C)-99.4 F (37.4 C)] 98 F (36.7 C) (11/21 0552) Pulse Rate:  [83-92] 90 (11/21 0552) Resp:  [17-20] 18 (11/21 0552) BP: (116-138)/(62-71) 132/62 (11/21 0552) SpO2:  [96 %-100 %] 99 % (11/21 0552)  Intake/Output from previous day: 11/20 0701 - 11/21 0700 In: 718.8 [P.O.:240; I.V.:478.8] Out: 1875 [Urine:1875] Intake/Output this shift: Total I/O In: 345.1 [I.V.:345.1] Out: -   Recent Labs    02/19/21 0135 02/20/21 0255 02/21/21 0409  HGB 11.6* 10.9* 9.2*   Recent Labs    02/20/21 0255 02/21/21 0409  WBC 10.7* 7.2  RBC 3.38* 2.86*  HCT 31.3* 26.4*  PLT 195 177   Recent Labs    02/20/21 0255 02/21/21 0409  NA 132* 133*  K 3.8 3.8  CL 97* 98  CO2 25 25  BUN 31* 25*  CREATININE 1.48* 1.23  GLUCOSE 149* 123*  CALCIUM 8.6* 8.4*   No results for input(s): LABPT, INR in the last 72 hours.  Sensation intact distally Intact pulses distally Dorsiflexion/Plantar flexion intact Incision: dressing C/D/I   Assessment/Plan: 3 Days Post-Op Procedure(s) (LRB): TOTAL HIP ARTHROPLASTY ANTERIOR APPROACH (Right) Up with therapy WBAT right hip. Will send in pain meds. 81 mg aspirin and Plaxiv for DVT coverage. Can discharge from ortho standpoint, but also needs clearance from therapy.     Mcarthur Rossetti 02/21/2021, 7:50 AM

## 2021-02-21 NOTE — Plan of Care (Signed)

## 2021-02-21 NOTE — Evaluation (Signed)
Occupational Therapy Evaluation Patient Details Name: Joel Gonzales. MRN: [id number] DOB: 08/24/1945 Today's Date: 02/21/2021   History of Present Illness Patient is as a 75 yearold male S/P fall with subsequent hip fx. He underwent an anterior THA on 02/18/2021. He had a previous fall a month ago and suffered right sided rib fx. PMH CAD, DMII, CABGx6; cervical spondylosis,   Clinical Impression   Pt independent with ADLs and mobility at baseline. Pt min guard - min A for ADLs, min guard for transfers. Pt BP stable during session (147/67 before standing), and (135/68 after standing ADL). Pt reported mild dizziness, and minimal pain. Educated pt on WBAT precautions, verbalizes and demonstrates understanding. Educated pt on simulated tub transfer, pt verbalized and demonstrates understanding using RW. Pt presents with decreased activity tolerance, balance, and strength at this time, will continue to follow acutely. Continue to recommend CIR vs. HH at this time. If pt d/c home, will need 3-in-1/BSC and RW.     Recommendations for follow up therapy are one component of a multi-disciplinary discharge planning process, led by the attending physician.  Recommendations may be updated based on patient status, additional functional criteria and insurance authorization.   Follow Up Recommendations  Acute inpatient rehab (3hours/day)    Assistance Recommended at Discharge Intermittent Supervision/Assistance  Functional Status Assessment  Patient has had a recent decline in their functional status and demonstrates the ability to make significant improvements in function in a reasonable and predictable amount of time.  Equipment Recommendations  BSC/3in1;Other (comment) (as shower chair, and rolling walker)    Recommendations for Other Services PT consult     Precautions / Restrictions Precautions Precautions: Fall Precaution Comments: watch orthostatic BPs Restrictions Weight Bearing  Restrictions: Yes      Mobility Bed Mobility               General bed mobility comments: pt up in chair upon arrival    Transfers Overall transfer level: Needs assistance Equipment used: Rolling walker (2 wheels) Transfers: Sit to/from Stand Sit to Stand: Min guard           General transfer comment: BP taken sitting upright in chair, 147/67, 135/68 after mobilizing in room      Balance Overall balance assessment: Needs assistance Sitting-balance support: No upper extremity supported;Feet supported Sitting balance-Leahy Scale: Normal     Standing balance support: Bilateral upper extremity supported Standing balance-Leahy Scale: Fair                             ADL either performed or assessed with clinical judgement   ADL Overall ADL's : Needs assistance/impaired Eating/Feeding: Set up;Sitting   Grooming: Oral care;Min guard;Standing   Upper Body Bathing: Set up;Sitting   Lower Body Bathing: Minimal assistance;Sitting/lateral leans   Upper Body Dressing : Min guard;Sitting   Lower Body Dressing: Min guard;Sitting/lateral leans;Sit to/from stand   Toilet Transfer: Min guard;Ambulation;Regular Toilet;Rolling walker (2 wheels)   Toileting- Clothing Manipulation and Hygiene: Min guard;Sit to/from stand   Tub/ Shower Transfer: Min guard;BSC/3in1;Ambulation;Rolling walker (2 wheels)   Functional mobility during ADLs: Min guard General ADL Comments: Educated pt on shower transfer using BSC as shower seat, pt verbalized/demonstrated understanding     Vision   Vision Assessment?: No apparent visual deficits     Perception     Praxis      Pertinent Vitals/Pain Pain Assessment: Faces Pain Score: 3  Faces Pain Scale: Hurts a little  bit Pain Location: R hip Pain Descriptors / Indicators: Aching Pain Intervention(s): Limited activity within patient's tolerance;Monitored during session;Repositioned     Hand Dominance Right    Extremity/Trunk Assessment Upper Extremity Assessment Upper Extremity Assessment: Overall WFL for tasks assessed   Lower Extremity Assessment Lower Extremity Assessment: Defer to PT evaluation   Cervical / Trunk Assessment Cervical / Trunk Assessment: Normal   Communication Communication Communication: No difficulties   Cognition Arousal/Alertness: Awake/alert Behavior During Therapy: WFL for tasks assessed/performed Overall Cognitive Status: Within Functional Limits for tasks assessed                                       General Comments  wife present at end of session    Exercises     Shoulder Instructions      Home Living Family/patient expects to be discharged to:: Private residence Living Arrangements: Spouse/significant other Available Help at Discharge: Family Type of Home: House Home Access: Stairs to enter Technical brewer of Steps: 4 Entrance Stairs-Rails: Left Home Layout: Two level;Bed/bath upstairs Alternate Level Stairs-Number of Steps: flight Alternate Level Stairs-Rails: Can reach both Bathroom Shower/Tub: Teacher, early years/pre: Standard Bathroom Accessibility: No   Home Equipment: None          Prior Functioning/Environment Prior Level of Function : Independent/Modified Independent             Mobility Comments: Patient was active prior to his fall ADLs Comments: owns nursery        OT Problem List: Decreased strength;Decreased range of motion;Decreased activity tolerance;Impaired balance (sitting and/or standing);Pain;Decreased knowledge of use of DME or AE      OT Treatment/Interventions: Self-care/ADL training;Therapeutic exercise;DME and/or AE instruction;Patient/family education    OT Goals(Current goals can be found in the care plan section) Acute Rehab OT Goals Patient Stated Goal: return home OT Goal Formulation: With patient Time For Goal Achievement: 03/07/21 Potential to Achieve  Goals: Good ADL Goals Pt Will Perform Lower Body Dressing: with min guard assist;with adaptive equipment;sitting/lateral leans Pt Will Transfer to Toilet: with min guard assist;ambulating;regular height toilet Pt Will Perform Tub/Shower Transfer: shower seat;ambulating;with min guard assist  OT Frequency: Min 2X/week   Barriers to D/C:            Co-evaluation              AM-PAC OT "6 Clicks" Daily Activity     Outcome Measure Help from another person eating meals?: None Help from another person taking care of personal grooming?: None Help from another person toileting, which includes using toliet, bedpan, or urinal?: A Little Help from another person bathing (including washing, rinsing, drying)?: A Little Help from another person to put on and taking off regular upper body clothing?: A Little Help from another person to put on and taking off regular lower body clothing?: A Lot 6 Click Score: 19   End of Session Equipment Utilized During Treatment: Gait belt;Rolling walker (2 wheels) Nurse Communication: Mobility status  Activity Tolerance: Patient tolerated treatment well Patient left: in chair;with nursing/sitter in room;with family/visitor present;with call bell/phone within reach  OT Visit Diagnosis: Unsteadiness on feet (R26.81);Other abnormalities of gait and mobility (R26.89);Repeated falls (R29.6);Muscle weakness (generalized) (M62.81);Pain                Time: 7616-0737 OT Time Calculation (min): 47 min Charges:  OT General Charges $OT Visit: 1 Visit OT  Treatments $Self Care/Home Management : 23-37 mins  Lynnda Child, OTD, OTR/L Acute Rehab [phone] - [facility] 02/21/2021, 10:04 AM

## 2021-02-22 LAB — GLUCOSE, CAPILLARY
Glucose-Capillary: 126 mg/dL — ABNORMAL HIGH (ref 70–99)
Glucose-Capillary: 190 mg/dL — ABNORMAL HIGH (ref 70–99)

## 2021-02-22 MED ORDER — ASPIRIN 81 MG PO CHEW: 81.0000 mg | CHEWABLE_TABLET | Freq: Every day | ORAL | 0 refills | Status: AC

## 2021-02-22 NOTE — Discharge Summary (Signed)
Physician Discharge Summary  Joel Gonzales. MBW:[id number] DOB: [birth year] DOA: 02/17/2021  PCP: Joel Pao, MD  Admit date: 02/17/2021 Discharge date: 02/22/2021  Admitted From: Home Discharge disposition: Home with home health PT   Code Status: Full Code   Discharge Diagnosis:   Principal Problem:   Hip fracture ([place]) Active Problems:   Femoral fracture ([place])   Malnutrition of moderate degree    Chief Complaint  Patient presents with   level 2 fall on thinners    Brief narrative: Joel Weyer. is a 75 y.o. male with PMH significant for DM2, HTN, HLD, CAD s/p CABG about 20 years ago who lives at home with his wife and does not need any ambulatory assistance. Patient presented to the ED on 11/17 after mechanical fall leading to right hip pain.  He slipped and fell back hitting the right side of his body and hip while walking down the stairs.  Denies any head injury or loss of consciousness.  No premonition symptoms of lightheadedness or chest pain or shortness of breath. Of note, about a month ago he had 4 rib fractures on the right side after being pushed by his neighbors dog.  He states he recovered well from that injury.  Denies any gait imbalance or unsteadiness at baseline.  Imaging in the ED showed right sided femoral neck fracture.   Patient was admitted to hospitalist service Orthopedics consulted.  Subjective: Patient was seen and examined this morning.  Lying in bed.  Not in distress.  Not on supplemental oxygen. Feels ready to go home today.  Hospital course: Right hip fracture secondary to mechanical fall -Seen by orthopedics.   -11/18, underwent right total hip arthroplasty. -Pain controlled -On aspirin for DVT prophylaxis.  Orthostatic hypotension History of essential hypertension -On 11/20, patient had orthostatic hypotension with physical therapy.  He was started on IV fluid. -With IV hydration, blood pressure improved, creatinine  improved.  He was able to participate with therapy next day without having any dizziness or orthostatic drop. -His home meds include metoprolol succinate 50 mg daily, ramipril 5 mg twice daily, spironolactone 12.5 mg daily and HCTZ 12.5 mg daily -Currently he is on metoprolol only.  Other BP meds are on hold.  At discharge, I would resume ramipril or continue to hold HCTZ and Aldactone.  Patient will continue to monitor his blood pressure at home.  If blood pressure runs over 017 systolic, he can resume HCTZ and Aldactone, one medicine at a time. -Follow-up with PCP in a week with blood pressure log and for routine blood work.  AKI on CKD 2 -Presented with creatinine elevated to 1.51.  Creatinine improved with IV fluid.   Recent Labs    02/17/21 1149 02/18/21 0202 02/19/21 0135 02/20/21 0255 02/21/21 0409  BUN 25* 25* 27* 31* 25*  CREATININE 1.51* 1.41* 1.50* 1.48* 1.23   Type 2 diabetes mellitus -A1c 6.9 in 11/17 -Home meds include Synjardy XR 25/1000 mg daily, Actos 15 mg daily -Resume the same post discharge. Recent Labs  Lab 02/21/21 0804 02/21/21 1149 02/21/21 1634 02/21/21 2229 02/22/21 0738  GLUCAP 123* 187* 139* 244* 126*   CAD s/p CABG several years ago History of stroke -Currently has no anginal symptoms.  No EKG changes. -Continue Plavix and statin.  Aspirin for 4 weeks added for DVT prophylaxis.   Hyponatremia -Mild, euvolemic, HCTZ on hold at this time. -Sodium level improved after holding HCTZ. Recent Labs  Lab 02/17/21 1149 02/18/21 0202  02/19/21 0135 02/20/21 0255 02/21/21 0409  NA 132* 129* 128* 132* 133*   Recent multiple ribs fracture -Recovered well after that.  Currently respiratory status stable.    Okay to discharge home today with home health PT.   Allergies as of 02/22/2021   No Known Allergies      Medication List     STOP taking these medications    spironolactone-hydrochlorothiazide 25-25 MG tablet Commonly known as:  ALDACTAZIDE       TAKE these medications    aspirin 81 MG chewable tablet Chew 1 tablet (81 mg total) by mouth daily for 28 days.   atorvastatin 80 MG tablet Commonly known as: LIPITOR Take 80 mg by mouth at bedtime.   clopidogrel 75 MG tablet Commonly known as: PLAVIX Take 1 tablet (75 mg total) by mouth daily.   methocarbamol 500 MG tablet Commonly known as: ROBAXIN Take 1 tablet (500 mg total) by mouth every 6 (six) hours as needed for muscle spasms.   metoprolol succinate 50 MG 24 hr tablet Commonly known as: TOPROL-XL TAKE 1 TABLET WITH OR IMMEDIATELY FOLLOWING A MEAL ONCE A DAY. What changed: See the new instructions.   oxyCODONE 5 MG immediate release tablet Commonly known as: Oxy IR/ROXICODONE Take 1-2 tablets (5-10 mg total) by mouth every 6 (six) hours as needed for moderate pain (pain score 4-6). What changed:  how much to take when to take this reasons to take this   pantoprazole 40 MG tablet Commonly known as: PROTONIX Take 1 tablet (40 mg total) by mouth daily.   pioglitazone 15 MG tablet Commonly known as: ACTOS Take 15 mg by mouth daily.   ramipril 5 MG capsule Commonly known as: ALTACE TAKE 1 CAPSULE BY MOUTH TWICE DAILY   Synjardy XR 25-1000 MG Tb24 Generic drug: Empagliflozin-metFORMIN HCl ER Take 1 tablet by mouth daily.               Durable Medical Equipment  (From admission, onward)           Start     Ordered   02/18/21 1917  DME 3 n 1  Once        02/18/21 1917   02/18/21 1917  DME Walker rolling  Once       Question Answer Comment  Walker: With 5 Inch Wheels   Patient needs a walker to treat with the following condition Status post total replacement of right hip      02/18/21 1917            Discharge Instructions:  Diet Recommendation:  Discharge Diet Orders (From admission, onward)     Start     Ordered   02/22/21 0000  Diet Carb Modified        02/22/21 1017               @BRDDSCINSTRUCTIONS @  Follow ups:    Follow-up Information     Joel Rossetti, MD. Schedule an appointment as soon as possible for a visit in 2 week(s).   Specialty: Orthopedic Surgery Contact information: [place] [address] [phone]         Joel Pao, MD Follow up in 1 week(s).   Specialty: Internal Medicine Why: With blood pressure log and for routine blood work Contact information: [place] [address] [phone]                 Wound care:     Discharge Exam:  Vitals:   02/21/21 0952 02/21/21 2140 02/22/21 0536 02/22/21 0736  BP:  124/89 136/69 (!) 143/70  Pulse:  86 78 75  Resp: 20 20 17 17   Temp:  97.7 F (36.5 C) 97.9 F (36.6 C) 97.7 F (36.5 C)  TempSrc:  Oral  Oral  SpO2:  99% 97% 99%  Weight:      Height:        Body mass index is 25.1 kg/m.  General exam: Pleasant, elderly Caucasian male.  Not in physical distress.  Pain controlled Skin: No rashes, lesions or ulcers. HEENT: Atraumatic, normocephalic, no obvious bleeding Lungs: Clear to auscultation bilaterally CVS: Regular rate and rhythm, no murmur GI/Abd soft, nontender, nondistended, bowel sound present CNS: Alert, awake, oriented x3. Psychiatry: Mood appropriate Extremities: No pedal edema, no calf tenderness  Time coordinating discharge: 35 minutes   The results of significant diagnostics from this hospitalization (including imaging, microbiology, ancillary and laboratory) are listed below for reference.    Procedures and Diagnostic Studies:   CT Head Wo Contrast  Result Date: 02/17/2021 CLINICAL DATA:  Head trauma, minor. EXAM: CT HEAD WITHOUT CONTRAST TECHNIQUE: Contiguous axial images were obtained from the base of the skull through the vertex without intravenous contrast. COMPARISON:  03/04/2017 FINDINGS: Brain: Mild age related volume loss. Moderate chronic small-vessel ischemic changes of the  cerebral hemispheric white matter. No sign of acute infarction, mass lesion, hemorrhage, hydrocephalus or extra-axial collection. Vascular: There is atherosclerotic calcification of the major vessels at the base of the brain. Skull: Negative Sinuses/Orbits: Clear/normal Other: None IMPRESSION: No acute or traumatic finding. Mild generalized atrophy. Moderate chronic small-vessel ischemic changes of the hemispheric white matter. Cerebral Atrophy (ICD10-G31.9). Electronically Signed   By: [place] Chimes M.D.   On: 02/17/2021 12:32   CT Cervical Spine Wo Contrast  Result Date: 02/17/2021 CLINICAL DATA:  Head and neck trauma. EXAM: CT CERVICAL SPINE WITHOUT CONTRAST TECHNIQUE: Multidetector CT imaging of the cervical spine was performed without intravenous contrast. Multiplanar CT image reconstructions were also generated. COMPARISON:  03/04/2017 FINDINGS: Alignment: Normal Skull base and vertebrae: No regional fracture or focal bone finding. Soft tissues and spinal canal: Negative Disc levels: No significant disc level pathology. No stenosis of the canal. Mild bony foraminal narrowing at C3-4, C4-5 and C5-6. No facet arthropathy on the right. On the left there is facet osteoarthritis at C2-3. Upper chest: Pleural and parenchymal scarring at the left apex. Other: None IMPRESSION: No acute or traumatic finding. Spondylosis with mild bony foraminal narrowing at C3-4, C4-5 and C5-6. Electronically Signed   By: [place] Chimes M.D.   On: 02/17/2021 12:34   DG Pelvis Portable  Result Date: 02/18/2021 CLINICAL DATA:  Status post right hip replacement, initial encounter EXAM: PORTABLE PELVIS 2 VIEWS COMPARISON:  Intraoperative films from earlier in the same day. FINDINGS: Right hip prosthesis is noted in satisfactory position. Pelvic ring appears intact. No acute bony or soft tissue abnormality is noted. IMPRESSION: Status post right hip replacement. Electronically Signed   By: Inez Catalina M.D.   On: 02/18/2021 19:03    DG Chest Portable 1 View  Result Date: 02/17/2021 CLINICAL DATA:  Fall EXAM: PORTABLE CHEST 1 VIEW COMPARISON:  None. FINDINGS: Median sternotomy wires and mediastinal surgical clips are noted. The heart is at the upper limits of normal for size. The upper mediastinal contours are prominent, likely exaggerated by AP technique. There is no focal consolidation or pulmonary edema. There is no pleural effusion or pneumothorax. There are mildly displaced  fractures of the right fifth through ninth ribs. IMPRESSION: Mildly displaced fractures of the right fifth through ninth ribs. Electronically Signed   By: Valetta Mole M.D.   On: 02/17/2021 12:15   DG C-Arm 1-60 Min-No Report  Result Date: 02/18/2021 Fluoroscopy was utilized by the requesting physician.  No radiographic interpretation.   DG HIP OPERATIVE UNILAT WITH PELVIS RIGHT  Result Date: 02/18/2021 CLINICAL DATA:  Right hip arthroplasty. EXAM: OPERATIVE right HIP (WITH PELVIS IF PERFORMED) 5 VIEWS TECHNIQUE: Fluoroscopic spot image(s) were submitted for interpretation post-operatively. COMPARISON:  Right hip x-ray 02/17/2021. FINDINGS: Intraoperative right hip. 5 low resolution intraoperative spot views of the right hip arthroplasty were obtained. No fracture visible on the limited views. Total fluoroscopy time: 21 seconds Total radiation dose: 2.36 micro gray IMPRESSION: Intraoperative right hip arthroplasty. Electronically Signed   By: Ronney Asters M.D.   On: 02/18/2021 17:56   DG Hip Unilat W or Wo Pelvis 2-3 Views Right  Result Date: 02/17/2021 CLINICAL DATA:  Fall, right hip pain EXAM: DG HIP (WITH OR WITHOUT PELVIS) 2-3V RIGHT COMPARISON:  None. FINDINGS: Acute right femoral neck fracture with mild impaction and varus angulation. Femur is externally rotated. Femoroacetabular joint intact without dislocation. Mild joint space narrowing of both hips. Diffuse soft tissue swelling about the hip. IMPRESSION: Acute right femoral neck fracture  with mild impaction and varus angulation. Electronically Signed   By: Davina Poke D.O.   On: 02/17/2021 12:13     Labs:   Basic Metabolic Panel: Recent Labs  Lab 02/17/21 1149 02/18/21 0202 02/19/21 0135 02/20/21 0255 02/21/21 0409  NA 132* 129* 128* 132* 133*  K 3.8 3.8 4.4 3.8 3.8  CL 96* 95* 94* 97* 98  CO2 23 25 22 25 25   GLUCOSE 172* 130* 208* 149* 123*  BUN 25* 25* 27* 31* 25*  CREATININE 1.51* 1.41* 1.50* 1.48* 1.23  CALCIUM 9.5 8.9 8.4* 8.6* 8.4*   GFR Estimated Creatinine Clearance: 54.4 mL/min (by C-G formula based on SCr of 1.23 mg/dL). Liver Function Tests: Recent Labs  Lab 02/17/21 1149  AST 20  ALT 21  ALKPHOS 119  BILITOT 0.8  PROT 7.2  ALBUMIN 4.1   No results for input(s): LIPASE, AMYLASE in the last 168 hours. No results for input(s): AMMONIA in the last 168 hours. Coagulation profile No results for input(s): INR, PROTIME in the last 168 hours.  CBC: Recent Labs  Lab 02/17/21 1149 02/18/21 0202 02/19/21 0135 02/20/21 0255 02/21/21 0409  WBC 8.4  --  12.3* 10.7* 7.2  NEUTROABS 6.0  --  11.2* 8.5* 4.9  HGB 13.8 13.6 11.6* 10.9* 9.2*  HCT 41.4 39.2 33.5* 31.3* 26.4*  MCV 93.2  --  93.1 92.6 92.3  PLT 305  --  215 195 177   Cardiac Enzymes: No results for input(s): CKTOTAL, CKMB, CKMBINDEX, TROPONINI in the last 168 hours. BNP: Invalid input(s): POCBNP CBG: Recent Labs  Lab 02/21/21 0804 02/21/21 1149 02/21/21 1634 02/21/21 2229 02/22/21 0738  GLUCAP 123* 187* 139* 244* 126*   D-Dimer No results for input(s): DDIMER in the last 72 hours. Hgb A1c No results for input(s): HGBA1C in the last 72 hours. Lipid Profile No results for input(s): CHOL, HDL, LDLCALC, TRIG, CHOLHDL, LDLDIRECT in the last 72 hours. Thyroid function studies No results for input(s): TSH, T4TOTAL, T3FREE, THYROIDAB in the last 72 hours.  Invalid input(s): FREET3 Anemia work up No results for input(s): VITAMINB12, FOLATE, FERRITIN, TIBC, IRON,  RETICCTPCT in the last 72 hours.  Microbiology Recent Results (from the past 240 hour(s))  Resp Panel by RT-PCR (Flu A&B, Covid) Nasopharyngeal Swab     Status: None   Collection Time: 02/17/21 12:32 PM   Specimen: Nasopharyngeal Swab; Nasopharyngeal(NP) swabs in vial transport medium  Result Value Ref Range Status   SARS Coronavirus 2 by RT PCR NEGATIVE NEGATIVE Final    Comment: (NOTE) SARS-CoV-2 target nucleic acids are NOT DETECTED.  The SARS-CoV-2 RNA is generally detectable in upper respiratory specimens during the acute phase of infection. The lowest concentration of SARS-CoV-2 viral copies this assay can detect is 138 copies/mL. A negative result does not preclude SARS-Cov-2 infection and should not be used as the sole basis for treatment or other patient management decisions. A negative result may occur with  improper specimen collection/handling, submission of specimen other than nasopharyngeal swab, presence of viral mutation(s) within the areas targeted by this assay, and inadequate number of viral copies(<138 copies/mL). A negative result must be combined with clinical observations, patient history, and epidemiological information. The expected result is Negative.  Fact Sheet for Patients:  EntrepreneurPulse.com.au  Fact Sheet for Healthcare Providers:  IncredibleEmployment.be  This test is no t yet approved or cleared by the [place] FDA and  has been authorized for detection and/or diagnosis of SARS-CoV-2 by FDA under an Emergency Use Authorization (EUA). This EUA will remain  in effect (meaning this test can be used) for the duration of the COVID-19 declaration under Section 564(b)(1) of the Act, 21 U.S.C.section 360bbb-3(b)(1), unless the authorization is terminated  or revoked sooner.       Influenza A by PCR NEGATIVE NEGATIVE Final   Influenza B by PCR NEGATIVE NEGATIVE Final    Comment: (NOTE) The Xpert Xpress  SARS-CoV-2/FLU/RSV plus assay is intended as an aid in the diagnosis of influenza from Nasopharyngeal swab specimens and should not be used as a sole basis for treatment. Nasal washings and aspirates are unacceptable for Xpert Xpress SARS-CoV-2/FLU/RSV testing.  Fact Sheet for Patients: EntrepreneurPulse.com.au  Fact Sheet for Healthcare Providers: IncredibleEmployment.be  This test is not yet approved or cleared by the [place] FDA and has been authorized for detection and/or diagnosis of SARS-CoV-2 by FDA under an Emergency Use Authorization (EUA). This EUA will remain in effect (meaning this test can be used) for the duration of the COVID-19 declaration under Section 564(b)(1) of the Act, 21 U.S.C. section 360bbb-3(b)(1), unless the authorization is terminated or revoked.  Performed at [facility], [place] [address] [place], [place] [address]   Surgical pcr screen     Status: None   Collection Time: 02/18/21 10:44 AM   Specimen: Nasal Mucosa; Nasal Swab  Result Value Ref Range Status   MRSA, PCR NEGATIVE NEGATIVE Final   Staphylococcus aureus NEGATIVE NEGATIVE Final    Comment: (NOTE) The Xpert SA Assay (FDA approved for NASAL specimens in patients 62 years of age and older), is one component of a comprehensive surveillance program. It is not intended to diagnose infection nor to guide or monitor treatment. Performed at [facility], [place] [address] [place], [place] [address]      Signed: Terrilee Croak  [facility] 02/22/2021, 10:17 AM

## 2021-02-22 NOTE — Progress Notes (Signed)
Physical Therapy Treatment Patient Details Name: Joel Gonzales. MRN: [id number] DOB: 11-24-1945 Today's Date: 02/22/2021   History of Present Illness Patient is as a 75 yearold male S/P fall with subsequent hip fx. He underwent an anterior THA on 02/18/2021. He had a previous fall a month ago and suffered right sided rib fx. PMH CAD, DMII, CABGx6; cervical spondylosis,    PT Comments    Pt demos improved activity tolerance and is able to amb inc distance today without difficulty.  Pt also able to initiate stair training without difficulty, requiring only CGA for safety. Pt continues to req intermittent VC's for safety with transitioning between sit > stand, but demos good carryover with reminders. Pt is safe to  transition home at this time with home health therapy.  Recommendations for follow up therapy are one component of a multi-disciplinary discharge planning process, led by the attending physician.  Recommendations may be updated based on patient status, additional functional criteria and insurance authorization.  Follow Up Recommendations  Home health PT     Assistance Recommended at Discharge Intermittent Supervision/Assistance  Equipment Recommendations  Rolling walker (2 wheels);BSC/3in1    Recommendations for Other Services       Precautions / Restrictions Precautions Precautions: None Restrictions Weight Bearing Restrictions: Yes RLE Weight Bearing: Weight bearing as tolerated     Mobility  Bed Mobility Overal bed mobility: Needs Assistance Bed Mobility: Sit to Supine     Supine to sit: Min assist Sit to supine: Min assist   General bed mobility comments: PT educates pt on using strong L LE to lift R LE BTB, however, pt. unable to complete task.  Requires min A for R LE negotiation.  Demos difficulty with scooting and PT educates pt that task is easier with HOB lowered.  Able to complete with mod I. Patient Response: Cooperative  Transfers Overall transfer  level: Needs assistance Equipment used: Rolling walker (2 wheels) Transfers: Sit to/from Stand Sit to Stand: Min guard           General transfer comment: Transfers off of BSC and BTB with CGA.  Uses grab bar in bathroom for safety, demos fair eccentric control when getting BTB.  VCs to reach for bed and not maintain hands on RW.    Ambulation/Gait Ambulation/Gait assistance: Supervision Gait Distance (Feet): 160 Feet Assistive device: Rolling walker (2 wheels) Gait Pattern/deviations: Step-through pattern;Decreased step length - right;Decreased step length - left       General Gait Details: Amb in halls without difficulty, demos dec step length and slow cadence with activity.   Stairs Stairs: Yes Stairs assistance: Min guard Stair Management: Step to pattern;Two rails Number of Stairs: 5 General stair comments: Negotiates 5 steps with CGA, educated on appropriate sequencing.  States he only has 1 rail on full flight at home and is educated to use wall or HHA on side without rail.  Demos understanding.   Wheelchair Mobility    Modified Rankin (Stroke Patients Only)       Balance Overall balance assessment: Modified Independent   Sitting balance-Leahy Scale: Good     Standing balance support: Bilateral upper extremity supported;During functional activity Standing balance-Leahy Scale: Fair                              Cognition Arousal/Alertness: Awake/alert Behavior During Therapy: WFL for tasks assessed/performed Overall Cognitive Status: Within Functional Limits for tasks assessed  General Comments: Pt. is still up in bathroom when PT arrives. States he wasn't able to have BM.  Wants a laxative, NSG notified about laxative and pain meds.  Agreeable to amb in hall and practice stairs.        Exercises Total Joint Exercises Ankle Circles/Pumps: AROM;Both;20 reps Hip ABduction/ADduction: AAROM;Right;20  reps Long Arc Quad: AROM;Right;20 reps Marching in Standing: Seated;AROM;Right;20 reps    General Comments General comments (skin integrity, edema, etc.): Declines to sit in chair on return back to room, is more comfortable in bed.  Is agreeable to do therex EOB prior to laying down.      Pertinent Vitals/Pain Pain Assessment: 0-10 Pain Score: 5  Pain Location: R hip Pain Descriptors / Indicators: Discomfort;Burning Pain Intervention(s): Monitored during session;Patient requesting pain meds-RN notified    Home Living                          Prior Function            PT Goals (current goals can now be found in the care plan section) Progress towards PT goals: Progressing toward goals    Frequency           PT Plan Current plan remains appropriate    Co-evaluation              AM-PAC PT "6 Clicks" Mobility   Outcome Measure  Help needed turning from your back to your side while in a flat bed without using bedrails?: A Little Help needed moving from lying on your back to sitting on the side of a flat bed without using bedrails?: A Little Help needed moving to and from a bed to a chair (including a wheelchair)?: A Little Help needed standing up from a chair using your arms (e.g., wheelchair or bedside chair)?: A Little Help needed to walk in hospital room?: A Little Help needed climbing 3-5 steps with a railing? : A Little 6 Click Score: 18    End of Session Equipment Utilized During Treatment: Gait belt Activity Tolerance: Patient tolerated treatment well Patient left: in bed;with call bell/phone within reach   PT Visit Diagnosis: Other abnormalities of gait and mobility (R26.89);Pain Pain - Right/Left: Right Pain - part of body: Hip     Time: 4356-8616 PT Time Calculation (min) (ACUTE ONLY): 25 min  Charges:  $Gait Training: 8-22 mins $Therapeutic Activity: 8-22 mins                     Charlene Cowdrey A. Gabbi Whetstone, PT, DPT Acute  Rehabilitation Services Office: [place] 02/22/2021, 10:20 AM

## 2021-02-22 NOTE — Progress Notes (Signed)
Physical Therapy Treatment Patient Details Name: Joel Gonzales. MRN: [id number] DOB: 10-14-1945 Today's Date: 02/22/2021   History of Present Illness Patient is as a 75 yearold male S/P fall with subsequent hip fx. He underwent an anterior THA on 02/18/2021. He had a previous fall a month ago and suffered right sided rib fx. PMH CAD, DMII, CABGx6; cervical spondylosis,    PT Comments    Pt c/o needing to have a BM.  Requests assistance to get to bathroom and then would like PT to return later in morning for remaining treatment.  Continues to require min A for bed mobility and transfers, but able to amb with CGA only.  PT to determine safety with return home today after follow up treatment this AM.  Recommendations for follow up therapy are one component of a multi-disciplinary discharge planning process, led by the attending physician.  Recommendations may be updated based on patient status, additional functional criteria and insurance authorization.  Follow Up Recommendations  Home health PT     Assistance Recommended at Discharge Intermittent Supervision/Assistance  Equipment Recommendations  Rolling walker (2 wheels);BSC/3in1    Recommendations for Other Services       Precautions / Restrictions Precautions Precautions: None Restrictions Weight Bearing Restrictions: Yes RLE Weight Bearing: Weight bearing as tolerated     Mobility  Bed Mobility Overal bed mobility: Needs Assistance Bed Mobility: Supine to Sit     Supine to sit: Min assist     General bed mobility comments: Min A for R LE negotiation to get OOB.  Good sitting balance, able to scoot forward with mod I. Patient Response: Cooperative  Transfers Overall transfer level: Needs assistance Equipment used: Rolling walker (2 wheels) Transfers: Sit to/from Stand Sit to Stand: Min guard           General transfer comment: Pt. requires CGA for sit > stand with VCs for safety with hand placement.  Takes 3  attempts to complete with PT cues not to pull on RW and to lift head and transition hands to RW upon standing. Able to transfer onto commode without difficulty.  Educated to extend R LE out in front for comfort with transition, but pt. declines.    Ambulation/Gait Ambulation/Gait assistance: Supervision Gait Distance (Feet): 10 Feet Assistive device: Rolling walker (2 wheels) Gait Pattern/deviations: Step-through pattern;Decreased step length - left;Decreased step length - right       General Gait Details: Amb short distance to bathroom with supervision only.  PT discusses step to pattern for pain control, but pt. declines.  States he prefers to step through.   Stairs             Wheelchair Mobility    Modified Rankin (Stroke Patients Only)       Balance     Sitting balance-Leahy Scale: Good     Standing balance support: Bilateral upper extremity supported;During functional activity Standing balance-Leahy Scale: Fair                              Cognition Arousal/Alertness: Awake/alert Behavior During Therapy: WFL for tasks assessed/performed Overall Cognitive Status: Within Functional Limits for tasks assessed                                 General Comments: Pt. is supine in bed when PT arrives.  Agreeable to work with PT but wants  to use bathroom first.  Asks that PT help him to bathroom, leave him to attempt a BM and come back later to finish treatment.        Exercises      General Comments        Pertinent Vitals/Pain Pain Assessment: 0-10 Pain Score: 5  Pain Location: R thigh Pain Descriptors / Indicators: Discomfort;Burning Pain Intervention(s): Monitored during session;Patient requesting pain meds-RN notified    Home Living                          Prior Function            PT Goals (current goals can now be found in the care plan section) Progress towards PT goals: Progressing toward goals     Frequency           PT Plan Current plan remains appropriate    Co-evaluation              AM-PAC PT "6 Clicks" Mobility   Outcome Measure  Help needed turning from your back to your side while in a flat bed without using bedrails?: A Little Help needed moving from lying on your back to sitting on the side of a flat bed without using bedrails?: A Little Help needed moving to and from a bed to a chair (including a wheelchair)?: A Little Help needed standing up from a chair using your arms (e.g., wheelchair or bedside chair)?: A Little Help needed to walk in hospital room?: A Little Help needed climbing 3-5 steps with a railing? : A Little 6 Click Score: 18    End of Session Equipment Utilized During Treatment: Gait belt Activity Tolerance: Patient tolerated treatment well Patient left: Other (comment) (in bathroom, understands to pull cord when complete if PT is not back)   PT Visit Diagnosis: Other abnormalities of gait and mobility (R26.89);Pain Pain - Right/Left: Right Pain - part of body: Hip     Time: 6294-7654 PT Time Calculation (min) (ACUTE ONLY): 8 min  Charges:                        Almando Brawley A. Lenin Kuhnle, PT, DPT Acute Rehabilitation Services Office: [place] 02/22/2021, 10:10 AM

## 2021-02-22 NOTE — Progress Notes (Signed)
  Mobility Specialist Criteria Algorithm Info.   02/22/21 1415  Mobility  Activity Ambulated in room;Dangled on edge of bed  Range of Motion/Exercises Active;All extremities  Level of Assistance Contact guard assist, steadying assist  Assistive Device None  RLE Weight Bearing WBAT  Distance Ambulated (ft) 25 ft  Mobility Ambulated with assistance in room  Mobility Response Tolerated well  Mobility performed by Mobility specialist  Bed Position Semi-fowlers      Patient received in bed requesting assistance to bathroom. Declined further mobility d/t getting discharged. Got to EOB independently and stood with supervision. Ambulated to bathroom at min guard without assistive device. Returned from restroom and requested assistance getting dressed. Donned clothing with minimal assistance for LE's. Was left dangling EOB with all needs met and family present.   02/22/2021 2:47 PM

## 2021-02-22 NOTE — TOC Initial Note (Signed)
Transition of Care (TOC) - Initial/Assessment Note    Patient Details  Name: Joel Gonzales. MRN: [id number] Date of Birth: 1945/11/11  Transition of Care [facility]) CM/SW Contact:    Sharin Mons, RN Phone Number: 02/22/2021, 10:32 AM  Clinical Narrative:                 Patient will DC to: home Anticipated DC date: 02/22/2021 Family notified: wife Transport by: car       -  S/PTHA on 02/18/2021 Per MD patient ready for DC today.RN, patient, patient's family, and Joel Gonzales notified of DC.   Pt without R x med concerns. Post hospital f/u noted on AVS. Wife to provide transportation to home. DME @ bedside to take home, rolling walker and 3in1/BSC. RNCM will sign off for now as intervention is no longer needed. Please consult [place] again if new needs arise.   Expected Discharge Plan: [place]     Patient Goals and CMS Choice     Choice offered to / list presented to : Patient  Expected Discharge Plan and Services Expected Discharge Plan: [place]   Discharge Planning Services: CM Consult     Expected Discharge Date: 02/22/21               DME Arranged: 3-N-1, Walker rolling DME Agency: [employer] Date DME Agency Contacted: 02/21/21   Representative spoke with at DME Agency: Freda Munro HH Arranged: PT [place]: [place] Date [place]: 02/21/21 Time HH Agency Contacted: [id number] Representative spoke with at [place]: [place] Arrangements/Services   Lives with:: Spouse Patient language and need for interpreter reviewed:: Yes Do you feel safe going back to the place where you live?: Yes      Need for Family Participation in Patient Care: Yes (Comment) Care giver support system in place?: Yes (comment)   Criminal Activity/Legal Involvement Pertinent to Current Situation/Hospitalization: No - Comment as needed  Activities of Daily Living Home Assistive Devices/Equipment:  None ADL Screening (condition at time of admission) Patient's cognitive ability adequate to safely complete daily activities?: Yes Is the patient deaf or have difficulty hearing?: No Does the patient have difficulty seeing, even when wearing glasses/contacts?: No Does the patient have difficulty concentrating, remembering, or making decisions?: No Patient able to express need for assistance with ADLs?: Yes Does the patient have difficulty dressing or bathing?: No Independently performs ADLs?: No Dressing (OT): Needs assistance Does the patient have difficulty walking or climbing stairs?: Yes Weakness of Legs: Right Weakness of Arms/Hands: None  Permission Sought/Granted   Permission granted to share information with : Yes, Verbal Permission Granted  Share Information with NAME: Joel Gonzales  [phone]           Emotional Assessment Appearance:: Appears stated age Attitude/Demeanor/Rapport: Engaged Affect (typically observed): Accepting Orientation: : Oriented to Self, Oriented to Place, Oriented to  Time, Oriented to Situation Alcohol / Substance Use: Not Applicable Psych Involvement: No (comment)  Admission diagnosis:  Hip fracture ([place]) [S72.009A] Closed fracture of neck of right femur, initial encounter ([place]) [S72.001A] Patient Active Problem List   Diagnosis Date Noted   Malnutrition of moderate degree 02/19/2021   Femoral fracture ([place]) 02/17/2021   Hip fracture ([place]) 02/17/2021   Acute ischemic stroke ([place]) 03/05/2017   Gait disturbance 03/05/2017   Unsteadiness    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

## 2021-02-27 DIAGNOSIS — I4510 Unspecified right bundle-branch block: Secondary | ICD-10-CM | POA: Diagnosis not present

## 2021-02-27 DIAGNOSIS — E785 Hyperlipidemia, unspecified: Secondary | ICD-10-CM | POA: Diagnosis not present

## 2021-02-27 DIAGNOSIS — Z471 Aftercare following joint replacement surgery: Secondary | ICD-10-CM | POA: Diagnosis not present

## 2021-02-27 DIAGNOSIS — G214 Vascular parkinsonism: Secondary | ICD-10-CM | POA: Diagnosis not present

## 2021-02-27 DIAGNOSIS — E119 Type 2 diabetes mellitus without complications: Secondary | ICD-10-CM | POA: Diagnosis not present

## 2021-02-27 DIAGNOSIS — I10 Essential (primary) hypertension: Secondary | ICD-10-CM | POA: Diagnosis not present

## 2021-02-27 DIAGNOSIS — Z7982 Long term (current) use of aspirin: Secondary | ICD-10-CM | POA: Diagnosis not present

## 2021-02-27 DIAGNOSIS — Z96641 Presence of right artificial hip joint: Secondary | ICD-10-CM | POA: Diagnosis not present

## 2021-02-27 DIAGNOSIS — Z8673 Personal history of transient ischemic attack (TIA), and cerebral infarction without residual deficits: Secondary | ICD-10-CM | POA: Diagnosis not present

## 2021-02-27 DIAGNOSIS — Z951 Presence of aortocoronary bypass graft: Secondary | ICD-10-CM | POA: Diagnosis not present

## 2021-02-27 DIAGNOSIS — Z7902 Long term (current) use of antithrombotics/antiplatelets: Secondary | ICD-10-CM | POA: Diagnosis not present

## 2021-02-27 DIAGNOSIS — E440 Moderate protein-calorie malnutrition: Secondary | ICD-10-CM | POA: Diagnosis not present

## 2021-02-27 DIAGNOSIS — I2510 Atherosclerotic heart disease of native coronary artery without angina pectoris: Secondary | ICD-10-CM | POA: Diagnosis not present

## 2021-02-27 DIAGNOSIS — M47812 Spondylosis without myelopathy or radiculopathy, cervical region: Secondary | ICD-10-CM | POA: Diagnosis not present

## 2021-02-27 DIAGNOSIS — Z9181 History of falling: Secondary | ICD-10-CM | POA: Diagnosis not present

## 2021-03-01 DIAGNOSIS — E119 Type 2 diabetes mellitus without complications: Secondary | ICD-10-CM | POA: Diagnosis not present

## 2021-03-01 DIAGNOSIS — M47812 Spondylosis without myelopathy or radiculopathy, cervical region: Secondary | ICD-10-CM | POA: Diagnosis not present

## 2021-03-01 DIAGNOSIS — G214 Vascular parkinsonism: Secondary | ICD-10-CM | POA: Diagnosis not present

## 2021-03-01 DIAGNOSIS — E785 Hyperlipidemia, unspecified: Secondary | ICD-10-CM | POA: Diagnosis not present

## 2021-03-01 DIAGNOSIS — I10 Essential (primary) hypertension: Secondary | ICD-10-CM | POA: Diagnosis not present

## 2021-03-01 DIAGNOSIS — Z471 Aftercare following joint replacement surgery: Secondary | ICD-10-CM | POA: Diagnosis not present

## 2021-03-02 DIAGNOSIS — W19XXXA Unspecified fall, initial encounter: Secondary | ICD-10-CM | POA: Diagnosis not present

## 2021-03-02 DIAGNOSIS — E1129 Type 2 diabetes mellitus with other diabetic kidney complication: Secondary | ICD-10-CM | POA: Diagnosis not present

## 2021-03-02 DIAGNOSIS — N1832 Chronic kidney disease, stage 3b: Secondary | ICD-10-CM | POA: Diagnosis not present

## 2021-03-02 DIAGNOSIS — S72001A Fracture of unspecified part of neck of right femur, initial encounter for closed fracture: Secondary | ICD-10-CM | POA: Diagnosis not present

## 2021-03-02 DIAGNOSIS — I1310 Hypertensive heart and chronic kidney disease without heart failure, with stage 1 through stage 4 chronic kidney disease, or unspecified chronic kidney disease: Secondary | ICD-10-CM | POA: Diagnosis not present

## 2021-03-04 DIAGNOSIS — M47812 Spondylosis without myelopathy or radiculopathy, cervical region: Secondary | ICD-10-CM | POA: Diagnosis not present

## 2021-03-04 DIAGNOSIS — E785 Hyperlipidemia, unspecified: Secondary | ICD-10-CM | POA: Diagnosis not present

## 2021-03-04 DIAGNOSIS — G214 Vascular parkinsonism: Secondary | ICD-10-CM | POA: Diagnosis not present

## 2021-03-04 DIAGNOSIS — Z471 Aftercare following joint replacement surgery: Secondary | ICD-10-CM | POA: Diagnosis not present

## 2021-03-04 DIAGNOSIS — E119 Type 2 diabetes mellitus without complications: Secondary | ICD-10-CM | POA: Diagnosis not present

## 2021-03-04 DIAGNOSIS — I10 Essential (primary) hypertension: Secondary | ICD-10-CM | POA: Diagnosis not present

## 2021-03-07 ENCOUNTER — Ambulatory Visit (INDEPENDENT_AMBULATORY_CARE_PROVIDER_SITE_OTHER): Payer: Medicare Other | Admitting: Physician Assistant

## 2021-03-07 ENCOUNTER — Encounter: Payer: Self-pay | Admitting: Physician Assistant

## 2021-03-07 DIAGNOSIS — Z471 Aftercare following joint replacement surgery: Secondary | ICD-10-CM | POA: Diagnosis not present

## 2021-03-07 DIAGNOSIS — I10 Essential (primary) hypertension: Secondary | ICD-10-CM | POA: Diagnosis not present

## 2021-03-07 DIAGNOSIS — E119 Type 2 diabetes mellitus without complications: Secondary | ICD-10-CM | POA: Diagnosis not present

## 2021-03-07 DIAGNOSIS — M47812 Spondylosis without myelopathy or radiculopathy, cervical region: Secondary | ICD-10-CM | POA: Diagnosis not present

## 2021-03-07 DIAGNOSIS — G214 Vascular parkinsonism: Secondary | ICD-10-CM | POA: Diagnosis not present

## 2021-03-07 DIAGNOSIS — E785 Hyperlipidemia, unspecified: Secondary | ICD-10-CM | POA: Diagnosis not present

## 2021-03-07 DIAGNOSIS — S72001A Fracture of unspecified part of neck of right femur, initial encounter for closed fracture: Secondary | ICD-10-CM

## 2021-03-07 DIAGNOSIS — Z96641 Presence of right artificial hip joint: Secondary | ICD-10-CM

## 2021-03-07 NOTE — Progress Notes (Signed)
HPI: 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  Patient sustained a right hip traumatic displaced femoral neck fracture and underwent surgery on 02/18/2021.  He is overall doing well.  Is on chronic Plavix.  He is ambulating with a walker.  He notes that he is having some pain in his right knee.  His currently taking no pain medication.   Physical exam: Right hip surgical incisions healing well no signs of infection wound dehiscence.  Small seroma.  Right calf supple nontender.  Right knee has no instability valgus varus stressing.  Good range of motion of the knee.  No abnormal warmth erythema the right knee.  Dorsiflexion plantarflexion right ankle intact.  He ambulates with a walker with a slow antalgic gait.  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  He will work on scar tissue mobilization.  He will increase his activities as tolerated.  Discussed with him that the knee soreness most likely is due to surgery and positioning.  This should resolve with time.  We will watch this over the next few months.  Follow-up with [place] 1 month sooner if there is any questions concerns.

## 2021-03-09 DIAGNOSIS — Z471 Aftercare following joint replacement surgery: Secondary | ICD-10-CM | POA: Diagnosis not present

## 2021-03-09 DIAGNOSIS — I10 Essential (primary) hypertension: Secondary | ICD-10-CM | POA: Diagnosis not present

## 2021-03-09 DIAGNOSIS — E785 Hyperlipidemia, unspecified: Secondary | ICD-10-CM | POA: Diagnosis not present

## 2021-03-09 DIAGNOSIS — M47812 Spondylosis without myelopathy or radiculopathy, cervical region: Secondary | ICD-10-CM | POA: Diagnosis not present

## 2021-03-09 DIAGNOSIS — G214 Vascular parkinsonism: Secondary | ICD-10-CM | POA: Diagnosis not present

## 2021-03-09 DIAGNOSIS — E119 Type 2 diabetes mellitus without complications: Secondary | ICD-10-CM | POA: Diagnosis not present

## 2021-03-11 DIAGNOSIS — E785 Hyperlipidemia, unspecified: Secondary | ICD-10-CM | POA: Diagnosis not present

## 2021-03-11 DIAGNOSIS — G214 Vascular parkinsonism: Secondary | ICD-10-CM | POA: Diagnosis not present

## 2021-03-11 DIAGNOSIS — I10 Essential (primary) hypertension: Secondary | ICD-10-CM | POA: Diagnosis not present

## 2021-03-11 DIAGNOSIS — Z471 Aftercare following joint replacement surgery: Secondary | ICD-10-CM | POA: Diagnosis not present

## 2021-03-11 DIAGNOSIS — E119 Type 2 diabetes mellitus without complications: Secondary | ICD-10-CM | POA: Diagnosis not present

## 2021-03-11 DIAGNOSIS — M47812 Spondylosis without myelopathy or radiculopathy, cervical region: Secondary | ICD-10-CM | POA: Diagnosis not present

## 2021-04-07 ENCOUNTER — Encounter: Payer: Self-pay | Admitting: Physician Assistant

## 2021-04-07 ENCOUNTER — Other Ambulatory Visit: Payer: Self-pay

## 2021-04-07 ENCOUNTER — Ambulatory Visit (INDEPENDENT_AMBULATORY_CARE_PROVIDER_SITE_OTHER): Payer: Medicare Other | Admitting: Physician Assistant

## 2021-04-07 DIAGNOSIS — Z96641 Presence of right artificial hip joint: Secondary | ICD-10-CM

## 2021-04-07 NOTE — Progress Notes (Signed)
°  HPI: [name] returns today status post right total hip arthroplasty with secondary to hip fracture.  States overall he is doing well.  Still having some pain in his knee but feels like the knee pain is improving.  He states his pain is 1-2 out of 10 pain at worst.  He denies any mechanical symptoms knee.  States the knee just feels very stiff when he first gets up and points to the anterior distal quad region.  Regards to his hip he is doing well.  Physical exam: General well-developed well-nourished male no acute distress ambulates without any assistive device. Right hip: Good range of motion of the hip with slight limitation of internal rotation.  Nontender over the trochanteric region.  Nontender down the IT band.  Surgical incisions well-healed no signs of infection. Right knee full active extension flexion to approximate 110 degrees.  Passive range of motion reveals no crepitus.  There is no abnormal warmth erythema or effusion about the knee.  Nontender with palpation along medial lateral joint line.  No instability valgus varus stressing right knee.  Right calf supple nontender.   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  Plan: Discussed with him that at this point time we could get radiographs of his knee he defers.  Therefore we will see him back in 1 month and see how he is doing overall.  Continues to have knee pain would recommend AP and lateral views of the right knee.  He will be mindful of any mechanical symptoms in the knee.  Questions encouraged and answered at length.  He is activities as tolerated.

## 2021-04-27 ENCOUNTER — Telehealth: Payer: Self-pay | Admitting: Orthopaedic Surgery

## 2021-04-27 NOTE — Telephone Encounter (Signed)
Patient called, he is having some dental work done soon. The dentist would like something in writing what he needs to take. [facility] phone number is [phone]

## 2021-04-27 NOTE — Telephone Encounter (Signed)
Note completed 

## 2021-04-27 NOTE — Telephone Encounter (Signed)
Called and lvm to get fax # to be able to get them this letter

## 2021-05-03 ENCOUNTER — Encounter: Payer: Self-pay | Admitting: Cardiovascular Disease

## 2021-05-03 ENCOUNTER — Ambulatory Visit (INDEPENDENT_AMBULATORY_CARE_PROVIDER_SITE_OTHER): Payer: Medicare Other | Admitting: Cardiovascular Disease

## 2021-05-03 ENCOUNTER — Other Ambulatory Visit: Payer: Self-pay

## 2021-05-03 VITALS — BP 136/78 | HR 60 | Ht 72.0 in | Wt 190.4 lb

## 2021-05-03 DIAGNOSIS — I4510 Unspecified right bundle-branch block: Secondary | ICD-10-CM | POA: Diagnosis not present

## 2021-05-03 DIAGNOSIS — E785 Hyperlipidemia, unspecified: Secondary | ICD-10-CM

## 2021-05-03 DIAGNOSIS — I10 Essential (primary) hypertension: Secondary | ICD-10-CM

## 2021-05-03 DIAGNOSIS — Z951 Presence of aortocoronary bypass graft: Secondary | ICD-10-CM | POA: Diagnosis not present

## 2021-05-03 NOTE — Progress Notes (Signed)
05/03/2021 Joel Gonzales   Jul 15, 1945  [id number]  Primary Physician Tisovec, Fransico Him, MD Primary Cardiologist: Lorretta Harp MD FACP, [place], [place], [place]  HPI:  Joel Gonzales. is a 76 y.o.  mildly overweight Caucasian male, father of 1 child, who I last saw in the office 04/27/2020.  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  He has a history of CAD status post coronary artery bypass surgery x6 in February 2001. He had a LIMA to his LAD, a RIMA to his PDA, right radial to the first obtuse marginal branch, diagonal branch, second marginal branch, and posterolateral branches. His other problems include treated hypertension, dyslipidemia, and chronic right bundle branch block. He denies chest pain or shortness of breath. Dr. Nicki Reaper has checked his lipid profile in the past. He had a Myoview stress test performed October 31, which was entirely normal. Since I saw him year ago he's been completely asymptomatic. He had a Myoview stress test performed in the office 05/15/14 which is entirely normal as was a 2-D echocardiogram.   He was admitted to the hospital with stroke type symptoms 03/04/17 and was seen by neurology. An MRI that showed a 4 mm punctate focus involving the subcortical white matter of the left parietal lobe. A 2-D echo was normal. He is currently wearing an event monitor. He complains of being unsteady on his feet with a shuffling gait and saw a gait disorder neurologist at [facility] .   Since I saw Joel Gonzales a year ago he continues to do well from a cardiology perspective.  He denies chest pain or shortness of breath.  He did fall down and broke several ribs back in September and broke his hip in November which he is slowly recovering from.     Current Meds  Medication Sig   amoxicillin (AMOXIL) 500 MG capsule Take 1,000 mg by mouth 2 (two) times daily. Dental work   atorvastatin (LIPITOR) 80 MG tablet Take 80 mg by mouth at  bedtime.   clopidogrel (PLAVIX) 75 MG tablet Take 1 tablet (75 mg total) by mouth daily.   Empagliflozin-metFORMIN HCl ER (SYNJARDY XR) 25-1000 MG TB24 Take 1 tablet by mouth daily.   methocarbamol (ROBAXIN) 500 MG tablet Take 1 tablet (500 mg total) by mouth every 6 (six) hours as needed for muscle spasms.   metoprolol succinate (TOPROL-XL) 50 MG 24 hr tablet TAKE 1 TABLET WITH OR IMMEDIATELY FOLLOWING A MEAL ONCE A DAY. (Patient taking differently: 50 mg daily.)   oxyCODONE (OXY IR/ROXICODONE) 5 MG immediate release tablet Take 1-2 tablets (5-10 mg total) by mouth every 6 (six) hours as needed for moderate pain (pain score 4-6).   pantoprazole (PROTONIX) 40 MG tablet Take 1 tablet (40 mg total) by mouth daily.   pioglitazone (ACTOS) 15 MG tablet Take 15 mg by mouth daily.   ramipril (ALTACE) 5 MG capsule TAKE 1 CAPSULE BY MOUTH TWICE DAILY (Patient taking differently: Take 5 mg by mouth 2 (two) times daily.)     No Known Allergies  Social History   Socioeconomic History   Marital status: Married    Spouse name: Not on file   Number of children: 1   Years of education: Not on file   Highest education level: Bachelor's degree (e.g., BA, AB, BS)  Occupational History    Comment: [employer]  Tobacco Use   Smoking status: Never   Smokeless tobacco: Never  Vaping Use   Vaping Use: Never used  Substance and Sexual Activity   Alcohol use: Yes    Comment: minor   Drug use: No   Sexual activity: Not on file  Other Topics Concern   Not on file  Social History Narrative   Lives at home with his wife   Right handed   Drinks at least 1 cup daily of caffeine   Social Determinants of Health   Financial Resource Strain: Not on file  Food Insecurity: Not on file  Transportation Needs: Not on file  Physical Activity: Not on file  Stress: Not on file  Social Connections: Not on file  Intimate Partner Violence: Not on file     Review of Systems: General: negative for  chills, fever, night sweats or weight changes.  Cardiovascular: negative for chest pain, dyspnea on exertion, edema, orthopnea, palpitations, paroxysmal nocturnal dyspnea or shortness of breath Dermatological: negative for rash Respiratory: negative for cough or wheezing Urologic: negative for hematuria Abdominal: negative for nausea, vomiting, diarrhea, bright red blood per rectum, melena, or hematemesis Neurologic: negative for visual changes, syncope, or dizziness All other systems reviewed and are otherwise negative except as noted above.    Blood pressure 136/78, pulse 60, height 6' (1.829 m), weight 190 lb 6.4 oz (86.4 kg), SpO2 97 %.  General appearance: alert and no distress Neck: no adenopathy, no carotid bruit, no JVD, supple, symmetrical, trachea midline, and thyroid not enlarged, symmetric, no tenderness/mass/nodules Lungs: clear to auscultation bilaterally Heart: regular rate and rhythm, S1, S2 normal, no murmur, click, rub or gallop Extremities: extremities normal, atraumatic, no cyanosis or edema Pulses: 2+ and symmetric Skin: Skin color, texture, turgor normal. No rashes or lesions Neurologic: Grossly normal  EKG sinus rhythm at 60 with right bundle branch block.  I personally reviewed this EKG.  ASSESSMENT AND PLAN:   Essential hypertension History of essential hypertension a blood pressure measured today at 136/78.  He is on metoprolol and ramipril.  Hyperlipidemia with target LDL less than 130 History of hyperlipidemia on high-dose statin therapy with lipid profile performed 02/02/2021 revealing a total cholesterol of 157, LDL of 87 and HDL of 42.  Hx of CABG History of CAD status post coronary artery bypass grafting times 10 May 1999 with a LIMA to his LAD, RIMA to the PDA, right radial to the first obtuse marginal branch, diagonal branch, second marginal and posterolateral branches.  His last Myoview performed 05/15/2014 was nonischemic.  He denies chest pain or  shortness of breath.  Right bundle branch block Chronic     Lorretta Harp MD FACP,FACC,FAHA, [facility] 05/03/2021 11:01 AM

## 2021-05-03 NOTE — Patient Instructions (Signed)

## 2021-05-03 NOTE — Assessment & Plan Note (Signed)
History of essential hypertension a blood pressure measured today at 136/78.  He is on metoprolol and ramipril.

## 2021-05-03 NOTE — Assessment & Plan Note (Signed)
History of CAD status post coronary artery bypass grafting times 10 May 1999 with a LIMA to his LAD, RIMA to the PDA, right radial to the first obtuse marginal branch, diagonal branch, second marginal and posterolateral branches.  His last Myoview performed 05/15/2014 was nonischemic.  He denies chest pain or shortness of breath.

## 2021-05-03 NOTE — Assessment & Plan Note (Signed)
Chronic. 

## 2021-05-03 NOTE — Assessment & Plan Note (Signed)
History of hyperlipidemia on high-dose statin therapy with lipid profile performed 02/02/2021 revealing a total cholesterol of 157, LDL of 87 and HDL of 42.

## 2021-05-11 ENCOUNTER — Encounter: Payer: Self-pay | Admitting: Physician Assistant

## 2021-05-11 ENCOUNTER — Other Ambulatory Visit: Payer: Self-pay

## 2021-05-11 ENCOUNTER — Ambulatory Visit (INDEPENDENT_AMBULATORY_CARE_PROVIDER_SITE_OTHER): Payer: Medicare Other | Admitting: Physician Assistant

## 2021-05-11 DIAGNOSIS — Z96641 Presence of right artificial hip joint: Secondary | ICD-10-CM

## 2021-05-11 NOTE — Progress Notes (Signed)
HPI: Joel Gonzales returns today follow-up of his right total hip arthroplasty which was performed on 02/18/2021.  States that overall he is improving.  Unfortunately got tangled up in an [place] since his last visit and had a fall felt that he pulled something is wrong but this is improved.  His knee pain he states is 95% better he denies any mechanical symptoms of the right knee.  His only complaint now is that he has stiffness when he first goes to ambulate and this is been present since postop.   Physical exam: General well-developed well-nourished male who ambulates without any assistive device. Right knee good range of motion without pain.  Right hip has good range of motion of the hip without pain.  He is able to cross his leg with minimal assistance using his left arm.   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  At that time we will obtain an AP pelvis and lateral view of his right hip.

## 2021-05-26 DIAGNOSIS — D225 Melanocytic nevi of trunk: Secondary | ICD-10-CM | POA: Diagnosis not present

## 2021-05-26 DIAGNOSIS — D485 Neoplasm of uncertain behavior of skin: Secondary | ICD-10-CM | POA: Diagnosis not present

## 2021-05-26 DIAGNOSIS — D0439 Carcinoma in situ of skin of other parts of face: Secondary | ICD-10-CM | POA: Diagnosis not present

## 2021-05-26 DIAGNOSIS — D0462 Carcinoma in situ of skin of left upper limb, including shoulder: Secondary | ICD-10-CM | POA: Diagnosis not present

## 2021-05-26 DIAGNOSIS — L570 Actinic keratosis: Secondary | ICD-10-CM | POA: Diagnosis not present

## 2021-05-26 DIAGNOSIS — Z85828 Personal history of other malignant neoplasm of skin: Secondary | ICD-10-CM | POA: Diagnosis not present

## 2021-05-26 DIAGNOSIS — L814 Other melanin hyperpigmentation: Secondary | ICD-10-CM | POA: Diagnosis not present

## 2021-05-26 DIAGNOSIS — L821 Other seborrheic keratosis: Secondary | ICD-10-CM | POA: Diagnosis not present

## 2021-06-08 DIAGNOSIS — D0462 Carcinoma in situ of skin of left upper limb, including shoulder: Secondary | ICD-10-CM | POA: Diagnosis not present

## 2021-06-08 DIAGNOSIS — Z85828 Personal history of other malignant neoplasm of skin: Secondary | ICD-10-CM | POA: Diagnosis not present

## 2021-07-14 ENCOUNTER — Other Ambulatory Visit: Payer: Self-pay | Admitting: Cardiovascular Disease

## 2021-07-25 ENCOUNTER — Other Ambulatory Visit: Payer: Self-pay | Admitting: Cardiovascular Disease

## 2021-08-02 ENCOUNTER — Other Ambulatory Visit: Payer: Self-pay | Admitting: Cardiovascular Disease

## 2021-08-11 DIAGNOSIS — E1165 Type 2 diabetes mellitus with hyperglycemia: Secondary | ICD-10-CM | POA: Diagnosis not present

## 2021-08-11 DIAGNOSIS — I679 Cerebrovascular disease, unspecified: Secondary | ICD-10-CM | POA: Diagnosis not present

## 2021-08-11 DIAGNOSIS — E1151 Type 2 diabetes mellitus with diabetic peripheral angiopathy without gangrene: Secondary | ICD-10-CM | POA: Diagnosis not present

## 2021-08-11 DIAGNOSIS — E663 Overweight: Secondary | ICD-10-CM | POA: Diagnosis not present

## 2021-08-11 DIAGNOSIS — I25810 Atherosclerosis of coronary artery bypass graft(s) without angina pectoris: Secondary | ICD-10-CM | POA: Diagnosis not present

## 2021-08-11 DIAGNOSIS — E1129 Type 2 diabetes mellitus with other diabetic kidney complication: Secondary | ICD-10-CM | POA: Diagnosis not present

## 2021-08-11 DIAGNOSIS — E7800 Pure hypercholesterolemia, unspecified: Secondary | ICD-10-CM | POA: Diagnosis not present

## 2021-08-11 DIAGNOSIS — N1832 Chronic kidney disease, stage 3b: Secondary | ICD-10-CM | POA: Diagnosis not present

## 2021-08-11 DIAGNOSIS — E113299 Type 2 diabetes mellitus with mild nonproliferative diabetic retinopathy without macular edema, unspecified eye: Secondary | ICD-10-CM | POA: Diagnosis not present

## 2021-08-11 DIAGNOSIS — I1310 Hypertensive heart and chronic kidney disease without heart failure, with stage 1 through stage 4 chronic kidney disease, or unspecified chronic kidney disease: Secondary | ICD-10-CM | POA: Diagnosis not present

## 2021-08-11 DIAGNOSIS — M80051S Age-related osteoporosis with current pathological fracture, right femur, sequela: Secondary | ICD-10-CM | POA: Diagnosis not present

## 2021-08-18 ENCOUNTER — Ambulatory Visit: Payer: Medicare Other | Admitting: Physician Assistant

## 2021-08-25 ENCOUNTER — Telehealth: Payer: Self-pay | Admitting: Physician Assistant

## 2021-08-25 NOTE — Telephone Encounter (Signed)
Called and advised pt he doesn't need antibiotics anymore. Pt stated stated understanding

## 2021-08-25 NOTE — Telephone Encounter (Signed)
Patient called asked if he can get an antibiotic because he is having dental work 08/31/2021. Patient uses [facility] in [place]. The number to contact patient is [phone]

## 2021-08-31 ENCOUNTER — Ambulatory Visit: Payer: Medicare Other | Admitting: Physician Assistant

## 2021-09-05 DIAGNOSIS — M80051S Age-related osteoporosis with current pathological fracture, right femur, sequela: Secondary | ICD-10-CM | POA: Diagnosis not present

## 2021-09-05 DIAGNOSIS — R2689 Other abnormalities of gait and mobility: Secondary | ICD-10-CM | POA: Diagnosis not present

## 2021-09-05 DIAGNOSIS — R61 Generalized hyperhidrosis: Secondary | ICD-10-CM | POA: Diagnosis not present

## 2021-09-05 DIAGNOSIS — R4689 Other symptoms and signs involving appearance and behavior: Secondary | ICD-10-CM | POA: Diagnosis not present

## 2021-09-07 ENCOUNTER — Ambulatory Visit: Payer: Medicare Other | Admitting: Physician Assistant

## 2021-09-08 DIAGNOSIS — M80051S Age-related osteoporosis with current pathological fracture, right femur, sequela: Secondary | ICD-10-CM | POA: Diagnosis not present

## 2021-09-12 ENCOUNTER — Encounter: Payer: Self-pay | Admitting: Physician Assistant

## 2021-09-12 ENCOUNTER — Ambulatory Visit (INDEPENDENT_AMBULATORY_CARE_PROVIDER_SITE_OTHER): Payer: Medicare Other

## 2021-09-12 ENCOUNTER — Ambulatory Visit (INDEPENDENT_AMBULATORY_CARE_PROVIDER_SITE_OTHER): Payer: Medicare Other | Admitting: Physician Assistant

## 2021-09-12 DIAGNOSIS — Z96641 Presence of right artificial hip joint: Secondary | ICD-10-CM | POA: Diagnosis not present

## 2021-09-12 NOTE — Progress Notes (Signed)
HPI: Mr. 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  States that hip overall is doing great.  He however is having some trouble with balance as he has had in the past.  He states therapy has been beneficial in the past.  He does have therapy set up to begin in [place] for gait and balance as of June 29.  In regards to his hip he states the incision is well-healed.  He has no pain in the hip whatsoever.  Review of systems: See HPI  Physical exam: General well-developed well-nourished male in walks without any assistive device.  Right hip: Excellent range of motion without pain.  Calf supple nontender.  Dorsiflexion plantarflexion ankle intact.  Leg lengths are equal  Radiographs: AP pelvis lateral view right hip show well-seated right total hip arthroplasty components.  No acute fracture.  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  Follow-up with [place] as needed otherwise.  Questions encouraged and answered.

## 2021-09-27 DIAGNOSIS — R2689 Other abnormalities of gait and mobility: Secondary | ICD-10-CM | POA: Diagnosis not present

## 2021-09-29 DIAGNOSIS — R2689 Other abnormalities of gait and mobility: Secondary | ICD-10-CM | POA: Diagnosis not present

## 2021-10-11 DIAGNOSIS — R296 Repeated falls: Secondary | ICD-10-CM | POA: Diagnosis not present

## 2021-10-11 DIAGNOSIS — M6281 Muscle weakness (generalized): Secondary | ICD-10-CM | POA: Diagnosis not present

## 2021-10-11 DIAGNOSIS — R2681 Unsteadiness on feet: Secondary | ICD-10-CM | POA: Diagnosis not present

## 2021-10-11 DIAGNOSIS — R2689 Other abnormalities of gait and mobility: Secondary | ICD-10-CM | POA: Diagnosis not present

## 2021-10-13 DIAGNOSIS — R2689 Other abnormalities of gait and mobility: Secondary | ICD-10-CM | POA: Diagnosis not present

## 2021-10-13 DIAGNOSIS — M6281 Muscle weakness (generalized): Secondary | ICD-10-CM | POA: Diagnosis not present

## 2021-10-13 DIAGNOSIS — R296 Repeated falls: Secondary | ICD-10-CM | POA: Diagnosis not present

## 2021-10-13 DIAGNOSIS — R2681 Unsteadiness on feet: Secondary | ICD-10-CM | POA: Diagnosis not present

## 2021-10-17 DIAGNOSIS — R2689 Other abnormalities of gait and mobility: Secondary | ICD-10-CM | POA: Diagnosis not present

## 2021-10-17 DIAGNOSIS — M6281 Muscle weakness (generalized): Secondary | ICD-10-CM | POA: Diagnosis not present

## 2021-10-17 DIAGNOSIS — R296 Repeated falls: Secondary | ICD-10-CM | POA: Diagnosis not present

## 2021-10-17 DIAGNOSIS — R2681 Unsteadiness on feet: Secondary | ICD-10-CM | POA: Diagnosis not present

## 2021-10-18 ENCOUNTER — Encounter: Payer: Self-pay | Admitting: Neurology

## 2021-10-18 ENCOUNTER — Ambulatory Visit (INDEPENDENT_AMBULATORY_CARE_PROVIDER_SITE_OTHER): Payer: Medicare Other | Admitting: Neurology

## 2021-10-18 VITALS — BP 171/74 | HR 54 | Ht 72.0 in | Wt 191.0 lb

## 2021-10-18 DIAGNOSIS — G20 Parkinson's disease: Secondary | ICD-10-CM | POA: Diagnosis not present

## 2021-10-18 DIAGNOSIS — R269 Unspecified abnormalities of gait and mobility: Secondary | ICD-10-CM | POA: Diagnosis not present

## 2021-10-18 DIAGNOSIS — F308 Other manic episodes: Secondary | ICD-10-CM

## 2021-10-18 DIAGNOSIS — R4189 Other symptoms and signs involving cognitive functions and awareness: Secondary | ICD-10-CM

## 2021-10-18 DIAGNOSIS — R29898 Other symptoms and signs involving the musculoskeletal system: Secondary | ICD-10-CM

## 2021-10-18 DIAGNOSIS — W19XXXA Unspecified fall, initial encounter: Secondary | ICD-10-CM | POA: Diagnosis not present

## 2021-10-18 NOTE — Progress Notes (Unsigned)
[facility]    Provider:  Dr Jaynee Eagles Referring Provider: Janus Molder, NP Primary Care Physician:  Haywood Pao, MD  CC:  Stroke, vascular parkinsonism  7/182022: Since then he fell and broke some ribs. He is supposed to be using a cane but was not. He broke ribs and a hip. His gait was getting better with PT but not 100%. Progressively worsening after the fall.He is shuffling. He missed a step and fell on the concrete. Wife is worried about him but no significant behavioral changes. Feels like memory is fine. He is worried about is physical ability to walk. Went to PT yesterday and goes tomorrow.  Personal reviewed and agree MRI 2018: IMPRESSION: MRI HEAD IMPRESSION:   1. Punctate 4 mm acute ischemic nonhemorrhagic small vessel type infarct involving the subcortical white matter of the left parietal lobe. 2. No other acute intracranial abnormality. 3. Moderate chronic small vessel ischemic disease, stable. 4. Multiple scattered chronic micro hemorrhages throughout the brain, similar to previous, again favored to reflect the sequelae of chronic hypertension.   MRI CERVICAL SPINE IMPRESSION:   1. No acute abnormality within the cervical spine. 2. 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 3. Multifactorial degenerative changes with resultant multilevel foraminal narrowing as above. Notable findings include moderate right C4, C5, and C6 foraminal stenosis, severe left C6 foraminal narrowing, with mild to moderate bilateral C7 foraminal stenosis. 4. Reactive edema about the left C2-3 facet due to facet arthritis. Finding could serve as a source for neck pain.  03-2016 DAT scan  Impression:   Normal I-123 DaT scan of the brain.     Electronically Reviewed by:  Fritzi Mandes, MD, [facility]  Electronically Reviewed on:  03/29/2017 3:16 PM   I have reviewed the images and concur with the above findings.    Electronically Signed by:  Kyra Leyland, MD, [facility]  Electronically Signed on:  03/29/2017 3:20 PM Other Result Text  Interface, Rad Results In - 03/29/2017  3:21 PM EST  Procedure : SPECT I-123 DaTscan of the brain.   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   Comparison : None   Radiopharmaceutical and Technique : 5.0 mCi of I-123 Ioflupane (DaTscan),  injected intravenously. SPECT images of the brain were acquired and  displayed in the transaxial plane.   Findings :   There is relatively symmetric distribution of radiotracer in the bilateral  caudate nuclei and bilateral putamen. There is minimal decreased activity  in the right putamen compared to the left, which may be related to  technical factors.    Impression:   Normal I-123 DaT scan of the brain.     Electronically Reviewed by:  Fritzi Mandes, MD, [facility]  Electronically Reviewed on:  03/29/2017 3:16 PM   Patient complains of symptoms per HPI as well as the following symptoms: gait abnormality . Pertinent negatives and positives per HPI. All others negative gait   HPI 2019:  Joel Gonzales. is a 76 y.o. male here as a referral from Dr. Sherlynn Stalls for stroke follow up and vascular parkinsonism.  Patient was admitted to [facility] beginning of December 2018 with a history of progressive unsteady gait.  He is here with his wife and daughter who provide much information.  He noted that he had been having more difficulty getting up out of chairs, having to use his arms to stand up.  Symptoms started last  August.  He feels as though he had balance problems which were progressive.  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  He noticed more shuffling, balance problems, falls but no tremors.  Noted having more difficulty getting up out of chairs and having to use his arms to stand up.  On the day of admission however he had an  episode of uncoordination in both his arms and legs, lasted for a few minutes and then got better.  Primary care ordered an MRI of the brain.  Per report, MRI of the brain was negative but a repeat MRI of the brain in the emergency room did show acute stroke.   Patient does feel much improved, he feels his gait and imbalance have improved by about 80% since physical therapy.  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  No treatment was suggested.  The stroke that was seen at [facility] was likely due to small vessel disease however they could not rule out embolic etiology in a 40-GQQ heart monitor was negative.  He was discharged on Plavix.  I reviewed MRI of the images with patient and his family, the stroke, the white matter changes that are likely the result of his vascular risk factors and causing his vascular parkinsonism, answered all questions.  Reviewed notes, labs and imaging from outside physicians, which showed:  Personally reviewed MRI of the brain images and agree with the following.  LDL 57, hemoglobin A1c 7  1. Punctate 4 mm acute ischemic nonhemorrhagic small vessel type infarct involving the subcortical white matter of the left parietal lobe. 2. No other acute intracranial abnormality. 3. Moderate chronic small vessel ischemic disease, stable. 4. Multiple scattered chronic micro hemorrhages throughout the brain, similar to previous, again favored to reflect the sequelae of chronic hypertension.  1. No acute abnormality within the cervical spine. 2. 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 3. Multifactorial degenerative changes with resultant multilevel foraminal narrowing as above. Notable findings include moderate right C4, C5, and C6 foraminal stenosis, severe left C6 foraminal narrowing, with mild to moderate bilateral C7 foraminal stenosis. 4. Reactive edema about the left C2-3 facet due to facet  arthritis. Finding could serve as a source for neck pain.   A DAT scan for report was negative for Parkinson's disorder, reviewed report  Reviewed notes from [place], DAT scan did not show overt decrease in dopaminergic transmission.  He was asked to continue PT.  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  Review of Systems: Patient complains of symptoms per HPI as well as the following symptoms: Imbalance, falls, stroke, neck pain, joint pain. Pertinent negatives and positives per HPI. All others negative.   Social History   Socioeconomic History   Marital status: Married    Spouse name: Not on file   Number of children: 1   Years of education: Not on file   Highest education level: Bachelor's degree (e.g., BA, AB, BS)  Occupational History    Comment: [employer]  Tobacco Use   Smoking status: Never   Smokeless tobacco: Never  Vaping Use   Vaping Use: Never used  Substance and Sexual Activity   Alcohol use: Yes    Comment: minor   Drug use: No   Sexual activity: Not on file  Other Topics Concern   Not on file  Social History Narrative  Lives at home with his wife   Right handed   Drinks at least 1 cup daily of caffeine   Social Determinants of Health   Financial Resource Strain: Not on file  Food Insecurity: Not on file  Transportation Needs: Not on file  Physical Activity: Not on file  Stress: Not on file  Social Connections: Not on file  Intimate Partner Violence: Not on file    Family History  Problem Relation Age of Onset   Heart attack Father 62       deceased, ASCVD   Hypertension Father    Heart disease Father    Heart Problems Mother        ASCVD   Hypertension Mother    Heart attack Mother    Heart attack Sister    Hypertension Brother    Hydrocephalus Brother     Past Medical History:  Diagnosis Date   CAD (coronary artery disease)    Diabetes mellitus without complication ([place])     Type 2   Family history of ASCVD    History of stress test 01/2012   which was entirely normal   Hx of CABG    x6 LIma to his LAD, a RIMA to his PDA, right radial to obtuse marginal branch, diagnonal, second marginal, & posteolateral branches   Hyperlipemia    Hypertension    RBBB    chronic    Past Surgical History:  Procedure Laterality Date   CHOLECYSTECTOMY  2002   CORONARY ARTERY BYPASS GRAFT     x6 LIma to his LAD, a RIMA to his PDA, right radial to obtuse marginal branch, diagnonal, second marginal, & posteolateral branches   NOSE SURGERY  02/2013   carcinoma   TONSILLECTOMY     as a child   TOTAL HIP ARTHROPLASTY Right 02/18/2021   Procedure: TOTAL HIP ARTHROPLASTY ANTERIOR APPROACH;  Surgeon: Mcarthur Rossetti, MD;  Location: [place];  Service: Orthopedics;  Laterality: Right;    Current Outpatient Medications  Medication Sig Dispense Refill   atorvastatin (LIPITOR) 80 MG tablet Take 80 mg by mouth at bedtime.     clopidogrel (PLAVIX) 75 MG tablet Take 1 tablet (75 mg total) by mouth daily. 30 tablet 0   Empagliflozin-metFORMIN HCl ER (SYNJARDY XR) 25-1000 MG TB24 Take 1 tablet by mouth daily.     metoprolol succinate (TOPROL-XL) 50 MG 24 hr tablet TAKE 1 TABLET WITH OR IMMEDIATELY FOLLOWING A MEAL ONCE A DAY. (Patient taking differently: 50 mg daily.) 90 tablet 2   pantoprazole (PROTONIX) 40 MG tablet Take 1 tablet (40 mg total) by mouth daily. 90 tablet 3   pioglitazone (ACTOS) 15 MG tablet Take 15 mg by mouth daily.     ramipril (ALTACE) 5 MG capsule TAKE 1 CAPSULE BY MOUTH TWICE DAILY 180 capsule 2   No current facility-administered medications for this visit.    Allergies as of 10/18/2021   (No Known Allergies)    Vitals: BP (!) 171/74   Pulse (!) 54   Ht 6' (1.829 m)   Wt 191 lb (86.6 kg)   BMI 25.90 kg/m  Last Weight:  Wt Readings from Last 1 Encounters:  10/18/21 191 lb (86.6 kg)   Last Height:   Ht Readings from Last 1 Encounters:   10/18/21 6' (1.829 m)   Physical exam: Exam: Gen: NAD                     CV: RRR, no MRG. No  Carotid Bruits. No peripheral edema, warm, nontender Eyes: Conjunctivae clear without exudates or hemorrhage  Neuro: Detailed Neurologic Exam   Speech:    Speech is normal; fluent and spontaneous with normal comprehension.  Cognition:    The patient is oriented to person, place, and time;     recent and remote memory intact;     language fluent;     normal attention, concentration,     fund of knowledge Cranial Nerves: Hypomimia    The pupils are equal, round, and reactive to light. Attempted fundoscopic exam could not visualize. . Visual fields are full to finger confrontation. Extraocular movements are intact. Trigeminal sensation is intact and the muscles of mastication are normal. The face is symmetric. The palate elevates in the midline. Hearing intact. Voice is normal. Shoulder shrug is normal. The tongue has normal motion without fasciculations.   Coordination:    No dysmetria  Gait:    Narrow based, good stride, decreased arm swing, bardykinesia  Motor Observation:    No asymmetry, no atrophy, and no involuntary movements noted. Tone:    Slight cogwheel right UE and mild spasticity right hand Posture:    Slightly stooped    Strength:    Strength is V/V in the upper and lower limbs.      Sensation: intact to LT     Reflex Exam:  DTR's:    Deep tendon reflexes in the upper and lower extremities are normal bilaterally.   Toes:    Upgoin toes left>> right  Clonus:    Clonus is absent.  Assessment/Plan: 76 year old male with a history of coronary artery disease status post CABG, diabetes, hypertension, hyperlipidemia here for follow-up gait abnormality, moderately advanced white-matter changges, gait abnormality, diagnosed with vascular parkinsonism 4 years ago at [facility], worsening gait and falls.  Diagnosed with vascular parkinsonism at [facility] movement disorders  center.   Vascular parkinsonism: Diagnosed with vascular parkinsonism at [facility] movement disorders center.  Discussed the diagnosis, answered all questions, reviewed images with patient pointing out white matter changes likely due to his vascular risk factors.  DAT scan was negative for Parkinson's disorders but imaging was over 4 years ago and he continues to progress so need another workup. Recommended increased exercise, referred to physical therapy for parkinsonism and balance exercises, provided recommendations and sources of information for vascular parkinsonism.  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 30-day cardiac event monitor negative for afib/flutter.    Orders Placed This Encounter  Procedures   MR BRAIN W WO CONTRAST   NM BRAIN DATSCAN TUMOR LOC INFLAM SPECT 1 DAY   Basic Metabolic Panel      Sarina Ill, MD  [facility] [address] [place], [place] [address]  Phone [phone] Fax [phone]  I spent  over 40 minutes of face-to-face and non-face-to-face time with patient on the  1. Gait disturbance   2. Fall, initial encounter   3. Parkinsonism,  unspecified Parkinsonism type ([place])   4. 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 Rigidity    diagnosis.  This included previsit chart review, lab review, study review, order entry, electronic health record documentation, patient education on the different diagnostic and therapeutic options, counseling and coordination of care, risks and benefits of management, compliance, or risk factor reduction

## 2021-10-18 NOTE — Patient Instructions (Signed)
MRI brain and cervical spine and DAT scan If negative,still agree with vascular parkinson's disease and we can try low-dose sinemet

## 2021-10-19 DIAGNOSIS — R2681 Unsteadiness on feet: Secondary | ICD-10-CM | POA: Diagnosis not present

## 2021-10-19 DIAGNOSIS — R2689 Other abnormalities of gait and mobility: Secondary | ICD-10-CM | POA: Diagnosis not present

## 2021-10-19 DIAGNOSIS — R296 Repeated falls: Secondary | ICD-10-CM | POA: Diagnosis not present

## 2021-10-19 DIAGNOSIS — M6281 Muscle weakness (generalized): Secondary | ICD-10-CM | POA: Diagnosis not present

## 2021-10-19 LAB — BASIC METABOLIC PANEL
BUN/Creatinine Ratio: 15 (ref 10–24)
BUN: 22 mg/dL (ref 8–27)
CO2: 25 mmol/L (ref 20–29)
Calcium: 9.8 mg/dL (ref 8.6–10.2)
Chloride: 105 mmol/L (ref 96–106)
Creatinine, Ser: 1.46 mg/dL — ABNORMAL HIGH (ref 0.76–1.27)
Glucose: 159 mg/dL — ABNORMAL HIGH (ref 70–99)
Potassium: 4.3 mmol/L (ref 3.5–5.2)
Sodium: 145 mmol/L — ABNORMAL HIGH (ref 134–144)
eGFR: 50 mL/min/{1.73_m2} — ABNORMAL LOW (ref >59)

## 2021-10-20 ENCOUNTER — Telehealth: Payer: Self-pay | Admitting: Neurology

## 2021-10-20 NOTE — Telephone Encounter (Signed)
medicare/BCBS sup NPR sent to [facility]

## 2021-10-20 NOTE — Telephone Encounter (Signed)
medicare/BCBS supp NPR sent to [facility]

## 2021-10-25 DIAGNOSIS — R296 Repeated falls: Secondary | ICD-10-CM | POA: Diagnosis not present

## 2021-10-25 DIAGNOSIS — M6281 Muscle weakness (generalized): Secondary | ICD-10-CM | POA: Diagnosis not present

## 2021-10-25 DIAGNOSIS — R2689 Other abnormalities of gait and mobility: Secondary | ICD-10-CM | POA: Diagnosis not present

## 2021-10-25 DIAGNOSIS — R2681 Unsteadiness on feet: Secondary | ICD-10-CM | POA: Diagnosis not present

## 2021-11-07 DIAGNOSIS — R296 Repeated falls: Secondary | ICD-10-CM | POA: Diagnosis not present

## 2021-11-07 DIAGNOSIS — R2689 Other abnormalities of gait and mobility: Secondary | ICD-10-CM | POA: Diagnosis not present

## 2021-11-07 DIAGNOSIS — R2681 Unsteadiness on feet: Secondary | ICD-10-CM | POA: Diagnosis not present

## 2021-11-07 DIAGNOSIS — M6281 Muscle weakness (generalized): Secondary | ICD-10-CM | POA: Diagnosis not present

## 2021-11-08 ENCOUNTER — Ambulatory Visit
Admission: RE | Admit: 2021-11-08 | Discharge: 2021-11-08 | Disposition: A | Discharge status: Home / Self Care | Payer: Medicare Other | Source: Ambulatory Visit | Attending: Neurology | Admitting: Neurology

## 2021-11-08 DIAGNOSIS — R269 Unspecified abnormalities of gait and mobility: Secondary | ICD-10-CM

## 2021-11-08 DIAGNOSIS — W19XXXA Unspecified fall, initial encounter: Secondary | ICD-10-CM

## 2021-11-08 DIAGNOSIS — F308 Other manic episodes: Secondary | ICD-10-CM

## 2021-11-08 DIAGNOSIS — R4189 Other symptoms and signs involving cognitive functions and awareness: Secondary | ICD-10-CM

## 2021-11-08 DIAGNOSIS — R29898 Other symptoms and signs involving the musculoskeletal system: Secondary | ICD-10-CM

## 2021-11-08 DIAGNOSIS — G20 Parkinson's disease: Secondary | ICD-10-CM

## 2021-11-08 MED ORDER — GADOBENATE DIMEGLUMINE 529 MG/ML IV SOLN
18.0000 mL | Freq: Once | INTRAVENOUS | Status: AC | PRN
  Administered 2021-11-08: 18 mL via INTRAVENOUS

## 2021-11-09 ENCOUNTER — Ambulatory Visit (HOSPITAL_COMMUNITY)
Admission: RE | Admit: 2021-11-09 | Discharge: 2021-11-09 | Disposition: A | Discharge status: Home / Self Care | Payer: Medicare Other | Source: Ambulatory Visit | Attending: Neurology | Admitting: Neurology

## 2021-11-09 DIAGNOSIS — R269 Unspecified abnormalities of gait and mobility: Secondary | ICD-10-CM | POA: Diagnosis not present

## 2021-11-09 DIAGNOSIS — W109XXA Fall (on) (from) unspecified stairs and steps, initial encounter: Secondary | ICD-10-CM | POA: Diagnosis not present

## 2021-11-09 DIAGNOSIS — W19XXXA Unspecified fall, initial encounter: Secondary | ICD-10-CM

## 2021-11-09 DIAGNOSIS — F308 Other manic episodes: Secondary | ICD-10-CM | POA: Diagnosis not present

## 2021-11-09 DIAGNOSIS — Z951 Presence of aortocoronary bypass graft: Secondary | ICD-10-CM | POA: Insufficient documentation

## 2021-11-09 DIAGNOSIS — Z8673 Personal history of transient ischemic attack (TIA), and cerebral infarction without residual deficits: Secondary | ICD-10-CM | POA: Insufficient documentation

## 2021-11-09 DIAGNOSIS — R29898 Other symptoms and signs involving the musculoskeletal system: Secondary | ICD-10-CM | POA: Diagnosis not present

## 2021-11-09 DIAGNOSIS — R4189 Other symptoms and signs involving cognitive functions and awareness: Secondary | ICD-10-CM | POA: Insufficient documentation

## 2021-11-09 DIAGNOSIS — G20 Parkinson's disease: Secondary | ICD-10-CM | POA: Insufficient documentation

## 2021-11-09 MED ORDER — POTASSIUM IODIDE (ANTIDOTE) 130 MG PO TABS
ORAL_TABLET | ORAL | Status: AC
  Filled 2021-11-09: qty 1

## 2021-11-09 MED ORDER — IOFLUPANE I 123 185 MBQ/2.5ML IV SOLN
4.7000 | Freq: Once | INTRAVENOUS | Status: AC | PRN
  Administered 2021-11-09: 4.7 via INTRAVENOUS
  Filled 2021-11-09: qty 5

## 2021-11-13 NOTE — Progress Notes (Signed)
Kindly inform the patient that MRI scan of the brain shows changes of age-related hardening of the tiny blood vessels in the brain which appear to be expectedly progressed compared with previous MRI from 2018.  No new or unexpected finding.  Nothing to worry about.

## 2021-11-15 ENCOUNTER — Telehealth: Payer: Self-pay | Admitting: *Deleted

## 2021-11-15 NOTE — Telephone Encounter (Signed)
I relayed to pt that per Dr Leonie Man (stroke director at [facility]) was able to review while Dr. Jaynee Eagles out and stated  that MRI scan of the brain shows changes of age-related hardening of the tiny blood vessels in the brain which appear to be expectedly progressed compared with previous MRI from 2018.  No new or unexpected finding.  Nothing to worry about.  Also relayed results of DAT SCAN per Dr Jaynee Eagles, that is not consistent with parkinsonian disorder.  Pt has appt in 03-01-2022  f/u.  He verbalized understanding.

## 2021-11-15 NOTE — Telephone Encounter (Signed)
-----   Message from Anda Latina, RN sent at 11/14/2021  7:57 AM EDT -----  ----- Message ----- From: Garvin Fila, MD Sent: 11/13/2021   4:08 PM EDT To: Gna-Pod 2 Results  Kindly inform the patient that MRI scan of the brain shows changes of age-related hardening of the tiny blood vessels in the brain which appear to be expectedly progressed compared with previous MRI from 2018.  No new or unexpected finding.  Nothing to worry about.

## 2021-12-14 DIAGNOSIS — Z23 Encounter for immunization: Secondary | ICD-10-CM | POA: Diagnosis not present

## 2021-12-15 DIAGNOSIS — R296 Repeated falls: Secondary | ICD-10-CM | POA: Diagnosis not present

## 2021-12-15 DIAGNOSIS — M6281 Muscle weakness (generalized): Secondary | ICD-10-CM | POA: Diagnosis not present

## 2021-12-15 DIAGNOSIS — R2689 Other abnormalities of gait and mobility: Secondary | ICD-10-CM | POA: Diagnosis not present

## 2021-12-15 DIAGNOSIS — R2681 Unsteadiness on feet: Secondary | ICD-10-CM | POA: Diagnosis not present

## 2021-12-27 DIAGNOSIS — H5213 Myopia, bilateral: Secondary | ICD-10-CM | POA: Diagnosis not present

## 2021-12-27 DIAGNOSIS — H524 Presbyopia: Secondary | ICD-10-CM | POA: Diagnosis not present

## 2021-12-27 DIAGNOSIS — H43822 Vitreomacular adhesion, left eye: Secondary | ICD-10-CM | POA: Diagnosis not present

## 2021-12-27 DIAGNOSIS — E113292 Type 2 diabetes mellitus with mild nonproliferative diabetic retinopathy without macular edema, left eye: Secondary | ICD-10-CM | POA: Diagnosis not present

## 2021-12-27 DIAGNOSIS — H2513 Age-related nuclear cataract, bilateral: Secondary | ICD-10-CM | POA: Diagnosis not present

## 2021-12-27 DIAGNOSIS — H43813 Vitreous degeneration, bilateral: Secondary | ICD-10-CM | POA: Diagnosis not present

## 2022-02-07 DIAGNOSIS — E7800 Pure hypercholesterolemia, unspecified: Secondary | ICD-10-CM | POA: Diagnosis not present

## 2022-02-07 DIAGNOSIS — R7989 Other specified abnormal findings of blood chemistry: Secondary | ICD-10-CM | POA: Diagnosis not present

## 2022-02-07 DIAGNOSIS — E1129 Type 2 diabetes mellitus with other diabetic kidney complication: Secondary | ICD-10-CM | POA: Diagnosis not present

## 2022-02-07 DIAGNOSIS — Z125 Encounter for screening for malignant neoplasm of prostate: Secondary | ICD-10-CM | POA: Diagnosis not present

## 2022-02-07 DIAGNOSIS — N1832 Chronic kidney disease, stage 3b: Secondary | ICD-10-CM | POA: Diagnosis not present

## 2022-02-14 DIAGNOSIS — E1151 Type 2 diabetes mellitus with diabetic peripheral angiopathy without gangrene: Secondary | ICD-10-CM | POA: Diagnosis not present

## 2022-02-14 DIAGNOSIS — Z0000 Encounter for general adult medical examination without abnormal findings: Secondary | ICD-10-CM | POA: Diagnosis not present

## 2022-02-14 DIAGNOSIS — E1165 Type 2 diabetes mellitus with hyperglycemia: Secondary | ICD-10-CM | POA: Diagnosis not present

## 2022-02-14 DIAGNOSIS — R2689 Other abnormalities of gait and mobility: Secondary | ICD-10-CM | POA: Diagnosis not present

## 2022-02-14 DIAGNOSIS — M810 Age-related osteoporosis without current pathological fracture: Secondary | ICD-10-CM | POA: Diagnosis not present

## 2022-02-14 DIAGNOSIS — N1832 Chronic kidney disease, stage 3b: Secondary | ICD-10-CM | POA: Diagnosis not present

## 2022-02-14 DIAGNOSIS — Z1331 Encounter for screening for depression: Secondary | ICD-10-CM | POA: Diagnosis not present

## 2022-02-14 DIAGNOSIS — E7800 Pure hypercholesterolemia, unspecified: Secondary | ICD-10-CM | POA: Diagnosis not present

## 2022-02-14 DIAGNOSIS — E1129 Type 2 diabetes mellitus with other diabetic kidney complication: Secondary | ICD-10-CM | POA: Diagnosis not present

## 2022-02-14 DIAGNOSIS — I679 Cerebrovascular disease, unspecified: Secondary | ICD-10-CM | POA: Diagnosis not present

## 2022-02-14 DIAGNOSIS — R82998 Other abnormal findings in urine: Secondary | ICD-10-CM | POA: Diagnosis not present

## 2022-02-14 DIAGNOSIS — E113299 Type 2 diabetes mellitus with mild nonproliferative diabetic retinopathy without macular edema, unspecified eye: Secondary | ICD-10-CM | POA: Diagnosis not present

## 2022-02-14 DIAGNOSIS — I25810 Atherosclerosis of coronary artery bypass graft(s) without angina pectoris: Secondary | ICD-10-CM | POA: Diagnosis not present

## 2022-02-14 DIAGNOSIS — I1310 Hypertensive heart and chronic kidney disease without heart failure, with stage 1 through stage 4 chronic kidney disease, or unspecified chronic kidney disease: Secondary | ICD-10-CM | POA: Diagnosis not present

## 2022-02-14 DIAGNOSIS — E663 Overweight: Secondary | ICD-10-CM | POA: Diagnosis not present

## 2022-02-14 DIAGNOSIS — Z1339 Encounter for screening examination for other mental health and behavioral disorders: Secondary | ICD-10-CM | POA: Diagnosis not present

## 2022-02-28 NOTE — Progress Notes (Unsigned)
[facility]    Provider:  Dr Jaynee Eagles Referring Provider: Haywood Pao, MD Primary Care Physician:  Haywood Pao, MD  CC:  Stroke, vascular parkinsonism  November 35, 3647: 76 year old male with a history of coronary artery disease status post CABG, diabetes, hypertension, hyperlipidemia here for follow-up gait abnormality, moderately advanced white-matter changges, gait abnormality, diagnosed with vascular parkinsonism 4 years ago at [facility], worsening gait and falls.  Diagnosed with vascular parkinsonism at [facility] movement disorders center.   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  Repeat MRI of the brain again showed moderate chronic small vessel ischemic disease and chronic cerebral microhemorrhages.  Minimal progression of above finding since 2018.  Could repeat MRI of the cervical spine, he declined a sleep study at last appointment.  MRI brain: 11/08/2021: STUDY DATE: 11/08/21 PATIENT NAME: Joel Gonzales. DOB: 1946/01/27 MRN: [id number]   ORDERING CLINICIAN: Melvenia Beam, MD  CLINICAL HISTORY: 76 year old male with gait difficulty.   EXAM: MR BRAIN W WO CONTRAST  TECHNIQUE: MRI of the brain with and without contrast was obtained utilizing multiplanar, multiecho pulse sequences. CONTRAST: 14m multihance  COMPARISON: 03/04/17 MRI   IMAGING SITE: [facility]       FINDINGS:    No abnormal lesions are seen on diffusion-weighted views to suggest acute ischemia. The cortical sulci, fissures and cisterns are normal in size and appearance. Lateral, third and fourth ventricle are normal in size and appearance. No extra-axial fluid collections are seen.  No evidence of mass effect or midline shift.  Moderate periventricular and subcortical foci of chronic small vessel ischemic disease.  No abnormal lesions are seen on post contrast views.     On sagittal views the posterior fossa, pituitary gland and corpus callosum are unremarkable.  Greater than 20 scattered supratentorial and infratentorial chronic cerebral microhemorrhages. The orbits and their contents, paranasal sinuses and calvarium are unremarkable.  Intracranial flow voids are present.       IMPRESSION:    MRI brain (with and without) demonstrating: -Moderate chronic small vessel ischemic disease. -Multiple supratentorial and infratentorial chronic cerebral microhemorrhages. -No acute findings.  Minimal progression of above findings since 2018.     7/182022: Since then he fell and broke some ribs. He is supposed to be using a cane but was not. He broke ribs and a hip. His gait was getting better with PT but not 100%. Progressively worsening after the fall.He is shuffling. He missed a step and fell on the concrete. Wife is worried about him but no significant behavioral changes. Feels like memory is fine. He is worried about is physical ability to walk. Went to PT yesterday and goes tomorrow.  Personal reviewed and agree MRI 2018: IMPRESSION: MRI HEAD IMPRESSION:   1. Punctate 4 mm acute ischemic nonhemorrhagic small vessel type infarct involving the subcortical white matter of the left parietal lobe. 2. No other acute intracranial abnormality. 3. Moderate chronic small vessel ischemic disease, stable. 4. Multiple scattered chronic micro hemorrhages throughout the brain, similar to previous, again favored to reflect the sequelae of chronic hypertension.   MRI CERVICAL SPINE IMPRESSION:   1. No acute abnormality within the cervical spine. 2. Mild cervical spondylolysis with  resultant mild spinal stenosis at C5-6 and C6-7. 3. Multifactorial degenerative changes with resultant  multilevel foraminal narrowing as above. Notable findings include moderate right C4, C5, and C6 foraminal stenosis, severe left C6 foraminal narrowing, with mild to moderate bilateral C7 foraminal stenosis. 4. Reactive edema about the left C2-3 facet due to facet arthritis. Finding could serve as a source for neck pain.  03-2016 DAT scan  Impression:   Normal I-123 DaT scan of the brain.     Electronically Reviewed by:  Fritzi Mandes, MD, [facility]  Electronically Reviewed on:  03/29/2017 3:16 PM   I have reviewed the images and concur with the above findings.   Electronically Signed by:  Kyra Leyland, MD, [facility]  Electronically Signed on:  03/29/2017 3:20 PM Other Result Text  Interface, Rad Results In - 03/29/2017  3:21 PM EST  Procedure : SPECT I-123 DaTscan of the brain.   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   Comparison : None   Radiopharmaceutical and Technique : 5.0 mCi of I-123 Ioflupane (DaTscan),  injected intravenously. SPECT images of the brain were acquired and  displayed in the transaxial plane.   Findings :   There is relatively symmetric distribution of radiotracer in the bilateral  caudate nuclei and bilateral putamen. There is minimal decreased activity  in the right putamen compared to the left, which may be related to  technical factors.    Impression:   Normal I-123 DaT scan of the brain.     Electronically Reviewed by:  Fritzi Mandes, MD, [facility]  Electronically Reviewed on:  03/29/2017 3:16 PM   Patient complains of symptoms per HPI as well as the following symptoms: gait abnormality . Pertinent negatives and positives per HPI. All others negative gait   HPI 2019:  Joel Gonzales. is a 76 y.o. male here as a referral from Dr. Osborne Casco for stroke follow up and vascular parkinsonism.  Patient was admitted to [facility] beginning of December 2018 with a history of progressive unsteady  gait.  He is here with his wife and daughter who provide much information.  He noted that he had been having more difficulty getting up out of chairs, having to use his arms to stand up.  Symptoms started last August.  He feels as though he had balance problems which were progressive.  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  He noticed more shuffling, balance problems, falls but no tremors.  Noted having more difficulty getting up out of chairs and having to use his arms to stand up.  On the day of admission however he had an episode of uncoordination in both his arms and legs, lasted for a few minutes and then got better.  Primary care ordered an MRI of the brain.  Per report, MRI of the brain was negative but a repeat MRI of the brain in the emergency room did show acute stroke.   Patient does feel much improved, he feels his gait and imbalance have improved by about 80% since physical therapy.  He was diagnosed with vascular parkinsonism at [facility].  No treatment was suggested.  The stroke that was seen at [facility] was likely due to small vessel disease however they could not rule out embolic etiology in a 62-IWL heart monitor was negative.  He was discharged on Plavix.  I reviewed MRI of the  images with patient and his family, the stroke, the white matter changes that are likely the result of his vascular risk factors and causing his vascular parkinsonism, answered all questions.  Reviewed notes, labs and imaging from outside physicians, which showed:  Personally reviewed MRI of the brain images and agree with the following.  LDL 57, hemoglobin A1c 7  1. Punctate 4 mm acute ischemic nonhemorrhagic small vessel type infarct involving the subcortical white matter of the left parietal lobe. 2. No other acute intracranial abnormality. 3. Moderate chronic small vessel ischemic disease, stable. 4. Multiple scattered chronic micro hemorrhages  throughout the brain, similar to previous, again favored to reflect the sequelae of chronic hypertension.  1. No acute abnormality within the cervical spine. 2. 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 3. Multifactorial degenerative changes with resultant multilevel foraminal narrowing as above. Notable findings include moderate right C4, C5, and C6 foraminal stenosis, severe left C6 foraminal narrowing, with mild to moderate bilateral C7 foraminal stenosis. 4. Reactive edema about the left C2-3 facet due to facet arthritis. Finding could serve as a source for neck pain.   A DAT scan for report was negative for Parkinson's disorder, reviewed report  Reviewed notes from [place], DAT scan did not show overt decrease in dopaminergic transmission.  He was asked to continue PT.  Diagnosed with parkinsonism, likely vascular parkinsonism at [facility] movement disorder center.  Review of Systems: Patient complains of symptoms per HPI as well as the following symptoms: Imbalance, falls, stroke, neck pain, joint pain. Pertinent negatives and positives per HPI. All others negative.   Social History   Socioeconomic History   Marital status: Married    Spouse name: Not on file   Number of children: 1   Years of education: Not on file   Highest education level: Bachelor's degree (e.g., BA, AB, BS)  Occupational History    Comment: [employer]  Tobacco Use   Smoking status: Never   Smokeless tobacco: Never  Vaping Use   Vaping Use: Never used  Substance and Sexual Activity   Alcohol use: Yes    Comment: minor   Drug use: No   Sexual activity: Not on file  Other Topics Concern   Not on file  Social History Narrative   Lives at home with his wife   Right handed   Drinks at least 1 cup daily of caffeine   Social Determinants of Health   Financial Resource Strain: Not on file  Food Insecurity: Not on file  Transportation Needs: Not on  file  Physical Activity: Not on file  Stress: Not on file  Social Connections: Not on file  Intimate Partner Violence: Not on file    Family History  Problem Relation Age of Onset   Heart attack Father 56       deceased, ASCVD   Hypertension Father    Heart disease Father    Heart Problems Mother        ASCVD   Hypertension Mother    Heart attack Mother    Heart attack Sister    Hypertension Brother    Hydrocephalus Brother     Past Medical History:  Diagnosis Date   CAD (coronary artery disease)    Diabetes mellitus without complication ([place])    Type 2   Family history of ASCVD    History of stress test 01/2012   which was entirely normal   Hx of CABG  x6 LIma to his LAD, a RIMA to his PDA, right radial to obtuse marginal branch, diagnonal, second marginal, & posteolateral branches   Hyperlipemia    Hypertension    RBBB    chronic    Past Surgical History:  Procedure Laterality Date   CHOLECYSTECTOMY  2002   CORONARY ARTERY BYPASS GRAFT     x6 LIma to his LAD, a RIMA to his PDA, right radial to obtuse marginal branch, diagnonal, second marginal, & posteolateral branches   NOSE SURGERY  02/2013   carcinoma   TONSILLECTOMY     as a child   TOTAL HIP ARTHROPLASTY Right 02/18/2021   Procedure: TOTAL HIP ARTHROPLASTY ANTERIOR APPROACH;  Surgeon: Mcarthur Rossetti, MD;  Location: [facility];  Service: Orthopedics;  Laterality: Right;    Current Outpatient Medications  Medication Sig Dispense Refill   atorvastatin (LIPITOR) 80 MG tablet Take 80 mg by mouth at bedtime.     clopidogrel (PLAVIX) 75 MG tablet Take 1 tablet (75 mg total) by mouth daily. 30 tablet 0   Empagliflozin-metFORMIN HCl ER (SYNJARDY XR) 25-1000 MG TB24 Take 1 tablet by mouth daily.     metoprolol succinate (TOPROL-XL) 50 MG 24 hr tablet TAKE 1 TABLET WITH OR IMMEDIATELY FOLLOWING A MEAL ONCE A DAY. (Patient taking differently: 50 mg daily.) 90 tablet 2   pantoprazole (PROTONIX) 40 MG tablet  Take 1 tablet (40 mg total) by mouth daily. 90 tablet 3   pioglitazone (ACTOS) 15 MG tablet Take 15 mg by mouth daily.     ramipril (ALTACE) 5 MG capsule TAKE 1 CAPSULE BY MOUTH TWICE DAILY 180 capsule 2   No current facility-administered medications for this visit.    Allergies as of 03/01/2022   (No Known Allergies)    Vitals: There were no vitals taken for this visit. Last Weight:  Wt Readings from Last 1 Encounters:  10/18/21 191 lb (86.6 kg)   Last Height:   Ht Readings from Last 1 Encounters:  10/18/21 6' (1.829 m)   Physical exam: Exam: Gen: NAD                     CV: RRR, no MRG. No Carotid Bruits. No peripheral edema, warm, nontender Eyes: Conjunctivae clear without exudates or hemorrhage  Neuro: Detailed Neurologic Exam   Speech:    Speech is normal; fluent and spontaneous with normal comprehension.  Cognition:    The patient is oriented to person, place, and time;     recent and remote memory intact;     language fluent;     normal attention, concentration,     fund of knowledge Cranial Nerves: Hypomimia    The pupils are equal, round, and reactive to light. Attempted fundoscopic exam could not visualize. . Visual fields are full to finger confrontation. Extraocular movements are intact. Trigeminal sensation is intact and the muscles of mastication are normal. The face is symmetric. The palate elevates in the midline. Hearing intact. Voice is normal. Shoulder shrug is normal. The tongue has normal motion without fasciculations.   Coordination:    No dysmetria  Gait:    Narrow based, good stride, decreased arm swing, bardykinesia  Motor Observation:    No asymmetry, no atrophy, and no involuntary movements noted. Tone:    Slight cogwheel right UE and mild spasticity right hand Posture:    Slightly stooped    Strength:    Strength is V/V in the upper and lower limbs.  Sensation: intact to LT     Reflex Exam:  DTR's:    Deep tendon reflexes  in the upper and lower extremities are normal bilaterally.   Toes:    Upgoin toes left>> right  Clonus:    Clonus is absent.  Assessment/Plan: 76 year old male with a history of coronary artery disease status post CABG, diabetes, hypertension, hyperlipidemia here for follow-up gait abnormality, moderately advanced white-matter changges, gait abnormality, diagnosed with vascular parkinsonism 4 years ago at [facility], worsening gait and falls.  Diagnosed with vascular parkinsonism at [facility] movement disorders center.   Vascular parkinsonism: Diagnosed with vascular parkinsonism at [facility] movement disorders center.  Discussed the diagnosis, answered all questions, reviewed images with patient pointing out white matter changes likely due to his vascular risk factors.  DAT scan was negative for Parkinson's disorders but imaging was over 4 years ago and he continues to progress so need another workup. Recommended increased exercise, referred to physical therapy for parkinsonism and balance exercises, provided recommendations and sources of information for vascular parkinsonism.  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 30-day cardiac  event monitor negative for afib/flutter.    No orders of the defined types were placed in this encounter.     Sarina Ill, MD  [facility] [address] [place], [place] [address]  Phone [phone] Fax [phone]  I spent over 40 minutes of face-to-face and non-face-to-face time with patient on the  No diagnosis found.  diagnosis.  This included previsit chart review, lab review, study review, order entry, electronic health record documentation, patient education on the different diagnostic and therapeutic options, counseling and coordination of care, risks and benefits of management, compliance, or risk factor reduction

## 2022-03-01 ENCOUNTER — Ambulatory Visit (INDEPENDENT_AMBULATORY_CARE_PROVIDER_SITE_OTHER): Payer: Medicare Other | Admitting: Neurology

## 2022-03-01 ENCOUNTER — Encounter: Payer: Self-pay | Admitting: Neurology

## 2022-03-01 VITALS — BP 154/78 | HR 66 | Ht 72.0 in | Wt 197.2 lb

## 2022-03-01 DIAGNOSIS — R482 Apraxia: Secondary | ICD-10-CM | POA: Diagnosis not present

## 2022-03-01 DIAGNOSIS — R9082 White matter disease, unspecified: Secondary | ICD-10-CM | POA: Insufficient documentation

## 2022-03-01 DIAGNOSIS — G214 Vascular parkinsonism: Secondary | ICD-10-CM | POA: Diagnosis not present

## 2022-03-01 MED ORDER — CARBIDOPA-LEVODOPA 25-100 MG PO TABS: 1.0000 | ORAL_TABLET | Freq: Two times a day (BID) | ORAL | 6 refills | Status: DC

## 2022-03-01 NOTE — Patient Instructions (Addendum)
76 year old male with a history of coronary artery disease status post CABG, diabetes, hypertension, hyperlipidemia here for follow-up gait abnormality, moderately advanced white-matter changges, gait abnormality, diagnosed with vascular parkinsonism 4 years ago at [facility], worsening gait and falls.  Diagnosed with vascular parkinsonism at [facility].  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  Repeat MRI of the brain again showed moderate chronic small vessel ischemic disease and chronic cerebral microhemorrhages(see pictures below).  Minimal progression of above finding since 2018.  Could repeat MRI of the cervical spine but cervical spine was neg in 2018 and has been having symptoms since them, he declined a sleep study at last appointment.  Medical management. Prevent falls (going to PT), can try low dose Sinemet and see if it helps.   Already set up for PT at [facility]. Discussed findings, reviewed images, discussed vascular parkinsonism (NOT parkinson's disease), gave literature. Manage medically at this point, watch risk factors we discussed. He starts walking and can;t stop. We discussed sinemet. SEE BELOW FOR EXPLANATION OF DISORDER   Very loud snoring, stroke: Sleep study was ordered in the past was not ocmpleted, he wants to hold off at this point, again declines  Lacunar stroke: I had a long d/w patient about stroke, risk for recurrent stroke/TIAs, personally independently reviewed imaging studies and stroke evaluation results and answered questions.Continue Plavix  for secondary stroke prevention and maintain strict control of hypertension with blood pressure goal below 130/90, diabetes with hemoglobin A1c goal below 6.5% and  lipids with LDL cholesterol goal below 70 mg/dL.I also advised the patient to eat a healthy diet with plenty of lean proteins, cereals, fruits and vegetables, exercise regularly and maintain ideal body weight. 30-day cardiac event monitor negative for afib/flutter.         No orders of the defined types were placed in this encounter.            NOT parkinson's disease. Problems due to clogged blood vessels in the brain (Cerebral Small Vessel Disease: What to Know & What to Do by Eloisa Northern, MD MPH Signs of cerebral small vessel disease. From [employer] al, BMJ. 2009 Jul 6;339:b2477. doi: [email or web address]/bmj.b2477  This article is about the most common aging brain problem that you may have never heard of. While leading a fall prevention workshop, I mentioned that an older person's walking and balance problems might well be related to the presence of "small vessel ischemic changes" in the brain, which are very common in aging adults. This led to an immediate flurry of follow-up questions. What exactly are these changes, people wanted to know. And do they happen to every older adult? Well, they don't happen to every older person, but they do happen to the vast majority of them.  In fact, one study of older adults aged 74-90 found that 95% of them showed signs of these changes on brain MRI. In other words, if your older parent ever gets an MRI of the head, he or she will probably show some signs of these changes. So this is a condition that older adults and families should know about. Furthermore, these changes have been associated with problems of consequence to older adults, including: Cognitive decline, Problems with walking or balance, Strokes, Vascular dementia.  Now, perhaps the best technical term for what I'm referring to is "cerebral small vessel disease." But many other synonyms are used by the medical community -- especially in radiology reports. They include: Small vessel  ischemic disease White matter disease Periventricular white matter changes Perivascular chronic ischemic white matter disease of aging Chronic microvascular changes, chronic microvascular ischemic changes White matter hyperintensities Age-related white matter changes Leukoaraiosis In this post, I will explain what all older adults and their families should know about this extremely common condition related to the brain health of older adults. In particular, I'll address the following frequently asked questions: What is cerebral small vessel disease (SVD)? What are the symptoms of cerebral SVD? What causes cerebral SVD? How can cerebral SVD be treated or prevented? Should you request an MRI if you're concerned about cerebral SVD? I will also address what you can do, if you are concerned about cerebral SVD for yourself or an older loved one. What is cerebral small vessel disease? Cerebral small vessel disease (SVD) is an umbrella term covering a variety of abnormalities related to small blood vessels in the brain. Because most brain tissue appears white on MRIs, these abnormalities were historically referred to as "white matter changes." Per a recent medical review article, specific examples of cerebral SVD include "lacunar infarcts" (which are a type of small stroke), "white matter hyperintensities" (which are a radiological finding), and "cerebral microbleeds" (which means bleeding in the brain from a very small blood vessel). In many cases, cerebral SVD seems to be a consequence of atherosclerosis affecting the smaller blood vessels that nourish brain tissue. Just as one's larger blood vessels in the heart or elsewhere can accumulate plaque, inflammation, and chronic damage over the years, so can the smaller blood vessels. Such chronic damage can lead the small blood vessels in the brain to become blocked (which starves brain cells of oxygen, and which we technically call ischemia), or to leak  (which causes bleeding, which we call hemorrhage and can damage nearby brain cells). When little bits of brain get damaged in these ways, they can change appearance on radiological scans. So when an MRI report says "white matter changes," this means the radiologist is seeing signs that probably indicate cerebral SVD. (Note: In this podcast episode, a [facility] brain health expert explains that although cerebral small vessel disease is probably the most common cause of white matter changes in older adults, it's not the only condition that can cause such changes. ) Such signs of SVD may be described as "mild", "moderate," or "severe/extensive," depending on how widespread they are. Here is an enlargement of a good image, from the BMJ article "Changes in white matter as determinant of global functional decline in older independent outpatients." From [employer] al, BMJ. 2009 Jul 6;339:b2477. doi: [email or web address]/[email or web address].   What are the symptoms of cerebral small vessel disease? The severity of symptoms tends to correspond to whether radiological imaging shows the cerebral SVD to be mild, moderate, or severe. Many older adults with cerebral SVD will have no noticeable symptoms. This is sometimes called "silent" SVD. But many problems have been associated with cerebral SVD, especially when it is moderate or severe. These include: Cognitive impairment. Several studies, such as this one, have found that cerebral SVD is correlated with worse scores on the Mini-Mental State Exam. When problems with thinking skills are associated with SVD, this can be called "vascular cognitive impairment." Problems with walking and balance. White matter lesions have been repeatedly associated with gait disturbances  and mobility difficulties. A 06/24/11 study found that moderate or severe cerebral SVD was associated with a decline in gait and balance function. Strokes. A June 23, 2008 meta-analysis concluded that white matter hyperintensities are  associated with a more than two-fold increase in the risk of stroke. Depression. White matter changes have been associated with a higher risk of depression in older people, and may represent a contributor to depression that is particular to having first-time depression in later life. Vascular dementia. Signs of cerebral SVD are associated with both having vascular dementia, and eventually developing vascular dementia. Other dementias. Research suggests that cerebral SVD is also associated with an increased risk -- or increased severity -- of other forms of dementia, such as Alzheimer's disease. Autopsy studies have confirmed that many older adults with dementia show signs of both Alzheimer's pathology and cerebral small vessel disease. Transition to disability or death. In a 06/24/2007 study of 71 non-disabled older persons (mean age 49), over a three-year follow-up period, 29.5% of participants with severe white matter changes and 15.1% of participants with moderate white matter changes developed disabilities or died. In comparison, only 10.5% of participants with mild white matter changes transitioned to disability or death over three years. The researchers concluded that severity of cerebral SVD is an important risk factor for overall decline in older adults. So what does this all mean, in terms of symptoms and cerebral SVD? Here's how I would boil it down: 1.Overall, older adults with any of the problems listed above have a high probability of having cerebral SVD. 2. But, many older adults with cerebral SVD on MRI are asymptomatic, and do not notice any difficulties. This is especially true of seniors with mild cerebral SVD. 3. Seniors with cerebral SVD are at increased risk of developing the problems above, often within a few years time. This is especially true of people with moderate or severe cerebral SVD. What causes cerebral small vessel disease? This is a topic of intense research, and the experts in  this area tend to really nerd out when discussing it. (Read the scholarly papers listed below to see what I mean.) One reason it's difficult to give an exact answer is that cerebral SVD is a broad umbrella term that encompasses many different types of problems with the brain's small blood vessels. Still, certain risk factors for developing cerebral SVD have been identified. Many overlap with risk factors for stroke. They include: Hypertension Dyslipidemia (e.g. high cholesterol) Atrial fibrillation Cerebral amyloid angiopathy Diabetes Smoking Age How can cerebral small vessel disease be treated or prevented? Experts are still trying to figure out the answers to this question, and research into the prevention of cerebral SVD is ongoing. Since progression of cerebral SVD seems often associated with clinical problems, experts are also trying to determine how we might prevent, or delay, the progression of SVD in older adults. Generally, experts recommend that clinicians consider treating any underlying risk factors. In most cases, this means detecting and treating any traditional risk factors for stroke. (For more on identifying and addressing stroke risk factors, see How to Address Cardiovascular Risk Factors for Better Brain Health: 12 Risks to Know & 5 Things to Do.) To date, studies of hypertension treatment to prevent the progression of white matter changes have shown mixed results. It appears that treating high blood pressure can slow the progression of brain changes in some people. But such treatment may be less effective in people who are older than 10, or who already have severe cerebral SVD.  In other words, your best bet for preventing or slowing down cerebral SVD may be to properly treat high blood pressure and other risk factors before you are 80, or otherwise have significant SVD. Furthermore, experts don't yet agree on how low to go, when it comes to optimal blood pressure for an older  person with cerebral small vessel disease. (This article explains why this has been difficult to determine.) For now, to prevent the occurrence or progression of cerebral small vessel disease, it's reasonable to start by observing the hypertension guidelines considered reasonable for most older adults: treat to a target of systolic blood pressure less than 158m/Hg. Whether to treat high blood pressure -- and other cardiovascular risk factors -- more aggressively should depend on an older person's particular health circumstances. I explain a step-by-step process you can use (with links to related research) here: 6 Steps to Better High Blood Pressure Treatment for Older Adults. Should you request an MRI if you're concerned about cerebral SVD? Not necessarily. In my opinion, older adults should only get MRIs of the brain if the following two things are true: They are experiencing worrisome clinical symptoms, and The results of the MRI are needed to decide on how to treat the person. For most older adults, an MRI showing signs of cerebral SVD will not, in of itself, change the management of medical problems. If you have high blood pressure, you should consider treatment. If you are having difficulties with walking or balance, signs of cerebral SVD do not rule out the possibility of other common causes of walking problems, such as medication side-effects, foot pain, neuropathy, and so forth. What if you're concerned about memory or thinking problems? Well, you probably will find signs of cerebral SVD on an MRI, just because this is a common finding in all older adults, and it's especially common in people who are experiencing cognitive changes. However, the MRI cannot tell you whether the cognitive changes you are noticing are only due to cerebral SVD, versus due to developing Alzheimer's disease, versus due one of the many other dementia mimics. You will still need to pursue a careful evaluation for cognitive  impairment. And no matter what the MRI shows, you will likely need to consider optimizing cardiovascular risk factors. So in most cases, a brain MRI just to check for cerebral SVD is probably not a good idea. However, if an MRI is indicated for other reasons, you may find out that an older person has mild, moderate, or severe signs of cerebral SVD. In this case, especially if the cerebral SVD is moderate or severe, you'll want to consider taking steps to reduce stroke risk, and also to monitor for cognitive changes and increased disability. What to do if you're worried about cerebral small vessel disease If you are worried about cerebral SVD, for yourself or for an older relative, here a few things you can do: Talk to your doctor about your concerns. You may want to discuss your options for optimizing vascular risk factors, including high blood pressure, high cholesterol, high blood sugar, smoking, and others. For more on identifying and addressing stroke risk factors, see How to Address Cardiovascular Risk Factors for Better Brain Health: 12 Risks to Know & 5 Things to Do. Remember that exercise, a healthy diet (such as the Mediterranean diet), good sleep, stress reduction, and many other non-pharmacological approaches can help manage vascular risk factors. Lifestyle approaches are safe and usually benefit your health in lots of ways. Medications to treat high  blood pressure and cholesterol should be used judiciously. If an MRI of the brain is clinically indicated -- or if one has recently been done -- ask the doctor to help you understand how the findings may correspond to any worrisome symptoms you've noticed. But if you've been worried about cognitive impairment or falls, remember that such problems are usually multi-factorial (i.e. they have multiple causes). So it's best to make sure the doctors have checked for all other common contributors to thinking problems and/or falls. If you want to learn still  more about cerebral small vessel disease, here are some scholarly articles on the subject: Mechanisms underlying sporadic cerebral small vessel disease: insights from neuroimaging Causes and consequences of cerebral small vessel disease. The [facility] study Vascular Contributions to Cognitive Impairment and Dementia: A Statement for Healthcare Professionals From the [place] [employer] (2011) Early Cerebral Small Vessel Disease and Brain Volume, Cognition, and Gait Cardiovascular risk factors and small vessel disease of the brain: Blood pressure, white matter lesions, and functional decline in older persons I also recommend listening to this very informative podcast interview, with Dr. Asencion Partridge of the [facility] [place]: [place]: Understanding White Matter Changes in the Aging Brain. Note: We've hit 300+ comments on this article! So we're closing comments for this article. Thank you for your interest! Note: This article has generated a lot of questions from people under age 42. If that describes you, please read below: Please read the article on "Early Cerebral Small Vessel Disease," the full article is available for free. This describes SVD found in people aged 23-75. In this study, 2-3% of participants in their 62s showed signs of cerebral SVD. You can check for more recent research on this topic by entering the above article at scholar.google.com, and then click the "Cited by" link to find newer articles that reference this article. I do not know much about cerebral SVD in younger adults; this is not the population that I personally treat nor read much about. (I'm already quite busy trying to keep up with research related to older adults.) As best I can tell, most of what we currently know about health outcomes related to cerebral SVD is based on the studies of older adults. It is not clear to me whether people with cerebral SVD at younger ages  should expect similar outcomes. I will not be able to answer most questions related to cerebral SVD in people under age 41. If you are concerned about what caused your MRI findings, or what they might mean for the future, please don't ask me to tell you, because I don't have these kinds of answers and I cannot quickly find them online. You should start by talking to your usual doctors, and perhaps a neurologist. If you would like to learn more, consider finding someone specialized in cerebral SVD in younger adults (e.g. someone doing and publishing research on this topic). Such experts are usually based at an academic medical center. Good luck!    Carbidopa; Levodopa Tablets What is this medication? CARBIDOPA; LEVODOPA (kar bi DOE pa; lee voe DOE pa) treats the symptoms of Parkinson disease. It works by increasing the amount of dopamine in your brain, a substance which helps manage body movements and coordination. This reduces the symptoms of Parkinson, such as body stiffness and tremors. This medicine may be used for other purposes; ask your health care provider or pharmacist if you have questions. COMMON BRAND NAME(S): Atamet, Dhivy, SINEMET  What should I tell my care team before I take this medication? They need to know if you have any of these conditions: Depression or other mental health conditions Diabetes Glaucoma Heart disease, including history of a heart attack History of irregular heartbeat Kidney disease Liver disease Lung or breathing disease, such as asthma Narcolepsy Sleep apnea Stomach or intestine problems An unusual or allergic reaction to levodopa, carbidopa, other medications, foods, dyes, or preservatives Pregnant or trying to get pregnant Breastfeeding How should I use this medication? Take this medication by mouth with a glass of water. Follow the directions on the prescription label. Take your doses at regular intervals. Do not take your medication more often than  directed. Do not stop taking except on the advice of your care team. One form of this medication is a breakable tablet. If your care team has prescribed this, this medication comes with INSTRUCTIONS FOR USE. Ask your pharmacist for directions on how to use this medicine. Read the information carefully. Talk to your pharmacist or care team if you have questions. Talk to your care team about the use of this medication in children. Special care may be needed. Overdosage: If you think you have taken too much of this medicine contact a poison control center or emergency room at once. NOTE: This medicine is only for you. Do not share this medicine with others. What if I miss a dose? If you miss a dose, take it as soon as you can. If it is almost time for your next dose, take only that dose. Do not take double or extra doses. What may interact with this medication? Do not take this medication with any of the following: MAOIs, such as Marplan, Nardil, and Parnate Reserpine Tetrabenazine This medication may also interact with the following: Alcohol Droperidol Entacapone Iron supplements or multivitamins with iron Isoniazid, INH Linezolid Medications for blood pressure Medications for mental health conditions Medications that help you fall asleep Metoclopramide Papaverine Procarbazine Tedizolid Rasagiline Selegiline Tolcapone This list may not describe all possible interactions. Give your health care provider a list of all the medicines, herbs, non-prescription drugs, or dietary supplements you use. Also tell them if you smoke, drink alcohol, or use illegal drugs. Some items may interact with your medicine. What should I watch for while using this medication? Visit your care team for regular checks on your progress. Tell your care team if your symptoms do not start to get better or if they get worse. A severe reaction similar to neuroleptic malignant syndrome (NMS) may occur if you reduce the  dose of or stop taking this medication too quickly. Symptoms of NMS include high fever, stiff muscles, increased sweating, fast or irregular heartbeat, and confusion. Contact your care team right away if think you have NMS. This medication may affect your coordination, reaction time, or judgment. Do not drive or operate machinery until you know how this medication affects you. Sit up or stand slowly to reduce the risk of dizzy or fainting spells. Drinking alcohol with this medication can increase the risk of these side effects. When taking this medication, you may fall asleep without notice. You may be doing activities, such as driving a car, talking, or eating. You may not feel drowsy before it happens. Contact your care team right away if this happens to you. There have been reports of increased sexual urges or other strong urges, such as gambling while taking this medication. If you experience any of these while taking this medication, you should  report this to your care team as soon as possible. You may experience a 'wearing off' effect before it is time to take your next dose of this medication. You may also experience an 'on-off' effect where the medication seems to stop working for minutes to hours, then suddenly starts working again. Tell your care team if this happens to you. Your dose may need to be adjusted. Eating high protein foods may affect how this medication works. Tell your care team if you change your diet. If you have diabetes, you may get a false-positive result for sugar in your urine. Check with your care team. This medication can make your saliva, sweat, or urine look dark red or black. This is normal but may stain clothing or fabrics. This medication may cause low levels of vitamin B6 in your body. Make sure that you get enough vitamin B6 while you are taking this medication. Discuss the foods you eat and the vitamins you take with your care team. What side effects may I notice from  receiving this medication? Side effects that you should report to your care team as soon as possible: Allergic reactions--skin rash, itching, hives, swelling of the face, lips, tongue, or throat Falling asleep during daily activities Heart rhythm changes--fast or irregular heartbeat, dizziness, feeling faint or lightheaded, chest pain, trouble breathing Low blood pressure--dizziness, feeling faint or lightheaded, blurry vision Mood and behavior changes--anxiety, nervousness, confusion, hallucinations, irritability, hostility, thoughts of suicide or self-harm, worsening mood, feelings of depression New or worsening uncontrolled and repetitive movements of the face, mouth, or upper body Stomach bleeding--bloody or black, tar-like stools, vomiting blood or brown material that looks like coffee grounds Sudden eye pain or change in vision such as blurry vision, seeing halos around lights, vision loss Urges to engage in impulsive behaviors such as gambling, binge eating, sexual activity, or shopping in ways that are unusual for you Side effects that usually do not require medical attention (report to your care team if they continue or are bothersome): Dark red or black saliva, sweat, or urine Dizziness Drowsiness Headache Nausea This list may not describe all possible side effects. Call your doctor for medical advice about side effects. You may report side effects to FDA at 1-800-FDA-1088. Where should I keep my medication? Keep out of the reach of children. Store at room temperature between 15 and 30 degrees C (59 and 86 degrees F). Protect from light. Throw away any unused medication after the expiration date. NOTE: This sheet is a summary. It may not cover all possible information. If you have questions about this medicine, talk to your doctor, pharmacist, or health care provider.  2023 Elsevier/Gold Standard (2007-05-11 00:00:00)

## 2022-03-02 ENCOUNTER — Emergency Department (HOSPITAL_COMMUNITY): Payer: Medicare Other

## 2022-03-02 ENCOUNTER — Telehealth: Payer: Self-pay | Admitting: Neurology

## 2022-03-02 ENCOUNTER — Inpatient Hospital Stay (HOSPITAL_COMMUNITY)
Admission: EM | Admit: 2022-03-02 | Discharge: 2022-03-08 | DRG: 522 | Disposition: A | Discharge status: Rehabilitation Facility | Payer: Medicare Other | Attending: Internal Medicine | Admitting: Internal Medicine

## 2022-03-02 ENCOUNTER — Other Ambulatory Visit: Payer: Self-pay

## 2022-03-02 DIAGNOSIS — E785 Hyperlipidemia, unspecified: Secondary | ICD-10-CM | POA: Diagnosis present

## 2022-03-02 DIAGNOSIS — R9082 White matter disease, unspecified: Secondary | ICD-10-CM

## 2022-03-02 DIAGNOSIS — S72002G Fracture of unspecified part of neck of left femur, subsequent encounter for closed fracture with delayed healing: Secondary | ICD-10-CM | POA: Diagnosis not present

## 2022-03-02 DIAGNOSIS — W19XXXA Unspecified fall, initial encounter: Secondary | ICD-10-CM | POA: Diagnosis not present

## 2022-03-02 DIAGNOSIS — Z8249 Family history of ischemic heart disease and other diseases of the circulatory system: Secondary | ICD-10-CM | POA: Diagnosis not present

## 2022-03-02 DIAGNOSIS — S72142A Displaced intertrochanteric fracture of left femur, initial encounter for closed fracture: Secondary | ICD-10-CM | POA: Diagnosis not present

## 2022-03-02 DIAGNOSIS — G4700 Insomnia, unspecified: Secondary | ICD-10-CM | POA: Diagnosis present

## 2022-03-02 DIAGNOSIS — Z79899 Other long term (current) drug therapy: Secondary | ICD-10-CM | POA: Diagnosis not present

## 2022-03-02 DIAGNOSIS — E119 Type 2 diabetes mellitus without complications: Secondary | ICD-10-CM | POA: Diagnosis present

## 2022-03-02 DIAGNOSIS — Z1152 Encounter for screening for COVID-19: Secondary | ICD-10-CM | POA: Diagnosis not present

## 2022-03-02 DIAGNOSIS — I10 Essential (primary) hypertension: Secondary | ICD-10-CM | POA: Diagnosis present

## 2022-03-02 DIAGNOSIS — R059 Cough, unspecified: Secondary | ICD-10-CM | POA: Diagnosis not present

## 2022-03-02 DIAGNOSIS — Z951 Presence of aortocoronary bypass graft: Secondary | ICD-10-CM

## 2022-03-02 DIAGNOSIS — R296 Repeated falls: Secondary | ICD-10-CM | POA: Diagnosis present

## 2022-03-02 DIAGNOSIS — Z7984 Long term (current) use of oral hypoglycemic drugs: Secondary | ICD-10-CM

## 2022-03-02 DIAGNOSIS — Z96642 Presence of left artificial hip joint: Secondary | ICD-10-CM | POA: Diagnosis not present

## 2022-03-02 DIAGNOSIS — I2510 Atherosclerotic heart disease of native coronary artery without angina pectoris: Secondary | ICD-10-CM | POA: Diagnosis not present

## 2022-03-02 DIAGNOSIS — Z7983 Long term (current) use of bisphosphonates: Secondary | ICD-10-CM | POA: Diagnosis not present

## 2022-03-02 DIAGNOSIS — S72012A Unspecified intracapsular fracture of left femur, initial encounter for closed fracture: Secondary | ICD-10-CM | POA: Diagnosis not present

## 2022-03-02 DIAGNOSIS — S72001A Fracture of unspecified part of neck of right femur, initial encounter for closed fracture: Secondary | ICD-10-CM | POA: Diagnosis present

## 2022-03-02 DIAGNOSIS — R2689 Other abnormalities of gait and mobility: Secondary | ICD-10-CM | POA: Diagnosis present

## 2022-03-02 DIAGNOSIS — Z96641 Presence of right artificial hip joint: Secondary | ICD-10-CM | POA: Diagnosis present

## 2022-03-02 DIAGNOSIS — Z8673 Personal history of transient ischemic attack (TIA), and cerebral infarction without residual deficits: Secondary | ICD-10-CM | POA: Diagnosis not present

## 2022-03-02 DIAGNOSIS — G214 Vascular parkinsonism: Secondary | ICD-10-CM | POA: Diagnosis present

## 2022-03-02 DIAGNOSIS — Z9049 Acquired absence of other specified parts of digestive tract: Secondary | ICD-10-CM

## 2022-03-02 DIAGNOSIS — S72002D Fracture of unspecified part of neck of left femur, subsequent encounter for closed fracture with routine healing: Secondary | ICD-10-CM | POA: Diagnosis present

## 2022-03-02 DIAGNOSIS — I517 Cardiomegaly: Secondary | ICD-10-CM | POA: Diagnosis not present

## 2022-03-02 DIAGNOSIS — W1830XA Fall on same level, unspecified, initial encounter: Secondary | ICD-10-CM | POA: Diagnosis present

## 2022-03-02 DIAGNOSIS — D62 Acute posthemorrhagic anemia: Secondary | ICD-10-CM | POA: Diagnosis present

## 2022-03-02 DIAGNOSIS — R269 Unspecified abnormalities of gait and mobility: Secondary | ICD-10-CM | POA: Diagnosis present

## 2022-03-02 DIAGNOSIS — I4510 Unspecified right bundle-branch block: Secondary | ICD-10-CM | POA: Diagnosis present

## 2022-03-02 DIAGNOSIS — Y9301 Activity, walking, marching and hiking: Secondary | ICD-10-CM | POA: Diagnosis present

## 2022-03-02 DIAGNOSIS — E1129 Type 2 diabetes mellitus with other diabetic kidney complication: Secondary | ICD-10-CM

## 2022-03-02 DIAGNOSIS — M4602 Spinal enthesopathy, cervical region: Secondary | ICD-10-CM | POA: Diagnosis not present

## 2022-03-02 DIAGNOSIS — Z7902 Long term (current) use of antithrombotics/antiplatelets: Secondary | ICD-10-CM

## 2022-03-02 DIAGNOSIS — S72002A Fracture of unspecified part of neck of left femur, initial encounter for closed fracture: Principal | ICD-10-CM

## 2022-03-02 DIAGNOSIS — S022XXA Fracture of nasal bones, initial encounter for closed fracture: Secondary | ICD-10-CM | POA: Diagnosis present

## 2022-03-02 DIAGNOSIS — Z471 Aftercare following joint replacement surgery: Secondary | ICD-10-CM | POA: Diagnosis not present

## 2022-03-02 DIAGNOSIS — Z96643 Presence of artificial hip joint, bilateral: Secondary | ICD-10-CM | POA: Diagnosis present

## 2022-03-02 DIAGNOSIS — Z6825 Body mass index (BMI) 25.0-25.9, adult: Secondary | ICD-10-CM | POA: Diagnosis not present

## 2022-03-02 DIAGNOSIS — S79919A Unspecified injury of unspecified hip, initial encounter: Secondary | ICD-10-CM | POA: Diagnosis not present

## 2022-03-02 DIAGNOSIS — E663 Overweight: Secondary | ICD-10-CM | POA: Diagnosis present

## 2022-03-02 DIAGNOSIS — M25559 Pain in unspecified hip: Secondary | ICD-10-CM | POA: Diagnosis not present

## 2022-03-02 DIAGNOSIS — Z043 Encounter for examination and observation following other accident: Secondary | ICD-10-CM | POA: Diagnosis not present

## 2022-03-02 DIAGNOSIS — R4189 Other symptoms and signs involving cognitive functions and awareness: Secondary | ICD-10-CM

## 2022-03-02 DIAGNOSIS — E876 Hypokalemia: Secondary | ICD-10-CM | POA: Diagnosis present

## 2022-03-02 DIAGNOSIS — R0600 Dyspnea, unspecified: Secondary | ICD-10-CM | POA: Diagnosis not present

## 2022-03-02 LAB — BASIC METABOLIC PANEL
Anion gap: 11 (ref 5–15)
BUN: 29 mg/dL — ABNORMAL HIGH (ref 8–23)
CO2: 24 mmol/L (ref 22–32)
Calcium: 9.4 mg/dL (ref 8.9–10.3)
Chloride: 106 mmol/L (ref 98–111)
Creatinine, Ser: 1.19 mg/dL (ref 0.61–1.24)
GFR, Estimated: >60 mL/min (ref >60)
Glucose, Bld: 189 mg/dL — ABNORMAL HIGH (ref 70–99)
Potassium: 4 mmol/L (ref 3.5–5.1)
Sodium: 141 mmol/L (ref 135–145)

## 2022-03-02 LAB — CBC WITH DIFFERENTIAL/PLATELET
Abs Immature Granulocytes: 0.04 10*3/uL (ref 0.00–0.07)
Basophils Absolute: 0.1 10*3/uL (ref 0.0–0.1)
Basophils Relative: 0 %
Eosinophils Absolute: 0 10*3/uL (ref 0.0–0.5)
Eosinophils Relative: 0 %
HCT: 45.9 % (ref 39.0–52.0)
Hemoglobin: 15.1 g/dL (ref 13.0–17.0)
Immature Granulocytes: 0 %
Lymphocytes Relative: 7 %
Lymphs Abs: 0.8 10*3/uL (ref 0.7–4.0)
MCH: 32.1 pg (ref 26.0–34.0)
MCHC: 32.9 g/dL (ref 30.0–36.0)
MCV: 97.5 fL (ref 80.0–100.0)
Monocytes Absolute: 0.4 10*3/uL (ref 0.1–1.0)
Monocytes Relative: 4 %
Neutro Abs: 10 10*3/uL — ABNORMAL HIGH (ref 1.7–7.7)
Neutrophils Relative %: 89 %
Platelets: 219 10*3/uL (ref 150–400)
RBC: 4.71 MIL/uL (ref 4.22–5.81)
RDW: 13.3 % (ref 11.5–15.5)
WBC: 11.3 10*3/uL — ABNORMAL HIGH (ref 4.0–10.5)
nRBC: 0 % (ref 0.0–0.2)

## 2022-03-02 LAB — GLUCOSE, CAPILLARY: Glucose-Capillary: 180 mg/dL — ABNORMAL HIGH (ref 70–99)

## 2022-03-02 LAB — TYPE AND SCREEN
ABO/RH(D): A POS
Antibody Screen: NEGATIVE

## 2022-03-02 LAB — PROTIME-INR
INR: 1.1 (ref 0.8–1.2)
Prothrombin Time: 13.8 seconds (ref 11.4–15.2)

## 2022-03-02 LAB — ABO/RH: ABO/RH(D): A POS

## 2022-03-02 MED ORDER — METHOCARBAMOL 1000 MG/10ML IJ SOLN: 500.0000 mg | Freq: Four times a day (QID) | INTRAVENOUS | Status: DC | PRN

## 2022-03-02 MED ORDER — PANTOPRAZOLE SODIUM 40 MG IV SOLR
40.0000 mg | Freq: Every day | INTRAVENOUS | Status: DC
  Administered 2022-03-02 – 2022-03-04 (×3): 40 mg via INTRAVENOUS
  Filled 2022-03-02 (×3): qty 10

## 2022-03-02 MED ORDER — SODIUM CHLORIDE 0.9 % IV SOLN: INTRAVENOUS | Status: DC

## 2022-03-02 MED ORDER — BISACODYL 10 MG RE SUPP: 10.0000 mg | Freq: Every day | RECTAL | Status: DC | PRN

## 2022-03-02 MED ORDER — INSULIN ASPART 100 UNIT/ML IJ SOLN
0.0000 [IU] | Freq: Every day | INTRAMUSCULAR | Status: DC
  Administered 2022-03-03: 2 [IU] via SUBCUTANEOUS

## 2022-03-02 MED ORDER — ENOXAPARIN SODIUM 40 MG/0.4ML IJ SOSY
40.0000 mg | PREFILLED_SYRINGE | INTRAMUSCULAR | Status: DC
  Administered 2022-03-03 – 2022-03-07 (×5): 40 mg via SUBCUTANEOUS
  Filled 2022-03-02 (×5): qty 0.4

## 2022-03-02 MED ORDER — ACETAMINOPHEN 650 MG RE SUPP: 650.0000 mg | Freq: Four times a day (QID) | RECTAL | Status: DC | PRN

## 2022-03-02 MED ORDER — METOPROLOL SUCCINATE ER 50 MG PO TB24
50.0000 mg | ORAL_TABLET | Freq: Every day | ORAL | Status: DC
  Administered 2022-03-03 – 2022-03-08 (×6): 50 mg via ORAL
  Filled 2022-03-02 (×6): qty 1

## 2022-03-02 MED ORDER — OXYCODONE HCL 5 MG PO TABS
5.0000 mg | ORAL_TABLET | ORAL | Status: DC | PRN
  Administered 2022-03-02 – 2022-03-08 (×5): 5 mg via ORAL
  Filled 2022-03-02 (×5): qty 1

## 2022-03-02 MED ORDER — INSULIN ASPART 100 UNIT/ML IJ SOLN
0.0000 [IU] | Freq: Three times a day (TID) | INTRAMUSCULAR | Status: DC
  Administered 2022-03-04: 1 [IU] via SUBCUTANEOUS
  Administered 2022-03-04: 2 [IU] via SUBCUTANEOUS
  Administered 2022-03-05 (×2): 1 [IU] via SUBCUTANEOUS
  Administered 2022-03-06: 2 [IU] via SUBCUTANEOUS
  Administered 2022-03-06: 1 [IU] via SUBCUTANEOUS
  Administered 2022-03-06 – 2022-03-07 (×2): 2 [IU] via SUBCUTANEOUS
  Administered 2022-03-07 (×2): 1 [IU] via SUBCUTANEOUS
  Administered 2022-03-08: 2 [IU] via SUBCUTANEOUS
  Administered 2022-03-08: 1 [IU] via SUBCUTANEOUS

## 2022-03-02 MED ORDER — MORPHINE SULFATE (PF) 2 MG/ML IV SOLN
2.0000 mg | INTRAVENOUS | Status: DC | PRN
  Administered 2022-03-02: 2 mg via INTRAVENOUS
  Filled 2022-03-02: qty 1

## 2022-03-02 MED ORDER — ACETAMINOPHEN 325 MG PO TABS
650.0000 mg | ORAL_TABLET | Freq: Four times a day (QID) | ORAL | Status: DC | PRN
  Administered 2022-03-02 – 2022-03-08 (×8): 650 mg via ORAL
  Filled 2022-03-02 (×7): qty 2

## 2022-03-02 MED ORDER — ALBUTEROL SULFATE (2.5 MG/3ML) 0.083% IN NEBU: 2.5000 mg | INHALATION_SOLUTION | Freq: Four times a day (QID) | RESPIRATORY_TRACT | Status: DC | PRN

## 2022-03-02 MED ORDER — ALBUTEROL SULFATE (2.5 MG/3ML) 0.083% IN NEBU: 2.5000 mg | INHALATION_SOLUTION | Freq: Four times a day (QID) | RESPIRATORY_TRACT | Status: DC

## 2022-03-02 MED ORDER — SENNA 8.6 MG PO TABS
1.0000 | ORAL_TABLET | Freq: Two times a day (BID) | ORAL | Status: DC
  Administered 2022-03-02 – 2022-03-07 (×9): 8.6 mg via ORAL
  Filled 2022-03-02 (×13): qty 1

## 2022-03-02 MED ORDER — HYDRALAZINE HCL 20 MG/ML IJ SOLN: 10.0000 mg | Freq: Four times a day (QID) | INTRAMUSCULAR | Status: DC | PRN

## 2022-03-02 NOTE — Telephone Encounter (Signed)
Pt is currently in hospital

## 2022-03-02 NOTE — ED Provider Notes (Signed)
[facility] DEPT Provider Note   CSN: [id number] Arrival date & time: 03/02/22  1341     History  Chief Complaint  Patient presents with   Joel Gonzales. is a 76 y.o. male.   Fall   Patient has a history of coronary artery disease, hypertension, hyperlipidemia, bundle branch block and gait instability.  Patient states he has a condition that is not Parkinson's but similar.  He has trouble with his gait and walking.  Patient states once he gets started sometimes he has trouble stopping.  He was walking and started to fall forward.  He ended up hitting his face against a car ended up landing on his left hip.  Patient is primarily having pain in his left thigh and hip area.  He did scrape his nose and his forehead.  He is chronically on Plavix.  He denies any loss of consciousness.  No shortness of breath.  No numbness or weakness    Home Medications Prior to Admission medications   Medication Sig Start Date End Date Taking? Authorizing Provider  alendronate (FOSAMAX) 70 MG tablet Take 70 mg by mouth once a week. 12/02/21   [provider]  atorvastatin (LIPITOR) 80 MG tablet Take 80 mg by mouth at bedtime. 02/09/21   [provider]  carbidopa-levodopa (SINEMET IR) 25-100 MG tablet Take 1 tablet by mouth 2 (two) times daily. Take on an empty stomach or with crackers if makes nauseated, If have side effects try 1/2 tablet twice daily. 03/01/22   Melvenia Beam, MD  clopidogrel (PLAVIX) 75 MG tablet Take 1 tablet (75 mg total) by mouth daily. 03/06/17   Debbe Odea, MD  Empagliflozin-metFORMIN HCl ER (SYNJARDY XR) 25-1000 MG TB24 Take 1 tablet by mouth daily.    [provider]  metoprolol succinate (TOPROL-XL) 50 MG 24 hr tablet TAKE 1 TABLET WITH OR IMMEDIATELY FOLLOWING A MEAL ONCE A DAY. Patient taking differently: 50 mg daily. 08/02/17   Lorretta Harp, MD  pantoprazole (PROTONIX) 40 MG tablet Take 1 tablet (40  mg total) by mouth daily. 02/23/15   Lorretta Harp, MD  pioglitazone (ACTOS) 15 MG tablet Take 15 mg by mouth daily.    [provider]  ramipril (ALTACE) 5 MG capsule TAKE 1 CAPSULE BY MOUTH TWICE DAILY 07/14/21   Lorretta Harp, MD      Allergies    Patient has no known allergies.    Review of Systems   Review of Systems  Physical Exam Updated Vital Signs BP 125/70   Pulse 76   Temp 97.6 F (36.4 C) (Oral)   Resp 19   SpO2 96%  Physical Exam Vitals and nursing note reviewed.  Constitutional:      General: He is not in acute distress.    Appearance: He is well-developed.  HENT:     Head: Normocephalic.     Comments: Abrasion noted right forehead and on the nose, tenderness palpation of the nose    Right Ear: External ear normal.     Left Ear: External ear normal.  Eyes:     General: No scleral icterus.       Right eye: No discharge.        Left eye: No discharge.     Conjunctiva/sclera: Conjunctivae normal.  Neck:     Trachea: No tracheal deviation.  Cardiovascular:     Rate and Rhythm: Normal rate and regular rhythm.  Pulmonary:  Effort: Pulmonary effort is normal. No respiratory distress.     Breath sounds: Normal breath sounds. No stridor. No wheezing or rales.  Abdominal:     General: Bowel sounds are normal. There is no distension.     Palpations: Abdomen is soft.     Tenderness: There is no abdominal tenderness. There is no guarding or rebound.  Musculoskeletal:        General: Tenderness present. No deformity.     Cervical back: Neck supple.     Comments: Tenderness palpation left hip  Skin:    General: Skin is warm and dry.     Findings: No rash.  Neurological:     General: No focal deficit present.     Mental Status: He is alert.     Cranial Nerves: No cranial nerve deficit (no facial droop, extraocular movements intact, no slurred speech).     Sensory: No sensory deficit.     Motor: No abnormal muscle tone or seizure activity.      Coordination: Coordination normal.  Psychiatric:        Mood and Affect: Mood normal.     ED Results / Procedures / Treatments   Labs (all labs ordered are listed, but only abnormal results are displayed) Labs Reviewed  BASIC METABOLIC PANEL - Abnormal; Notable for the following components:      Result Value   Glucose, Bld 189 (*)    BUN 29 (*)    All other components within normal limits  CBC WITH DIFFERENTIAL/PLATELET - Abnormal; Notable for the following components:   WBC 11.3 (*)    Neutro Abs 10.0 (*)    All other components within normal limits  PROTIME-INR  TYPE AND SCREEN    EKG Normal sinus rhythm rate 80 Right bundle branch block Normal ST-T waves   Radiology CT Hip Left Wo Contrast  Result Date: 03/02/2022 CLINICAL DATA:  Left femoral neck fracture after a fall EXAM: CT OF THE LEFT HIP WITHOUT CONTRAST TECHNIQUE: Multidetector CT imaging of the left hip was performed according to the standard protocol. Multiplanar CT image reconstructions were also generated. RADIATION DOSE REDUCTION: This exam was performed according to the departmental dose-optimization program which includes automated exposure control, adjustment of the mA and/or kV according to patient size and/or use of iterative reconstruction technique. COMPARISON:  03/02/2022 FINDINGS: Bones/Joint/Cartilage Acute intra-articular left femoral neck fracture with transcervical and subcapital components and mild apex anterior angulation. No other regional fracture identified. Currently no significant joint effusion. Mild spurring of the left SI joint anteriorly. Ligaments Suboptimally assessed by CT. Muscles and Tendons Unremarkable Soft tissues Aortoiliac atherosclerotic vascular calcification. IMPRESSION: 1. Acute intra-articular left femoral neck fracture with transcervical and subcapital components and mild apex anterior angulation. 2. Aortic atherosclerosis. Aortic Atherosclerosis (ICD10-I70.0). Electronically  Signed   By: Van Clines M.D.   On: 03/02/2022 16:09   CT HEAD WO CONTRAST  Result Date: 03/02/2022 CLINICAL DATA:  Head trauma.  Fall. EXAM: CT HEAD WITHOUT CONTRAST CT MAXILLOFACIAL WITHOUT CONTRAST CT CERVICAL SPINE WITHOUT CONTRAST TECHNIQUE: Multidetector CT imaging of the head, cervical spine, and maxillofacial structures were performed using the standard protocol without intravenous contrast. Multiplanar CT image reconstructions of the cervical spine and maxillofacial structures were also generated. RADIATION DOSE REDUCTION: This exam was performed according to the departmental dose-optimization program which includes automated exposure control, adjustment of the mA and/or kV according to patient size and/or use of iterative reconstruction technique. COMPARISON:  CT head 02/17/2021 FINDINGS: CT HEAD FINDINGS Brain:  Mild atrophy. Moderate white matter hypodensity diffusely with progression since 2022. Negative for acute infarct, hemorrhage, mass Vascular: Negative for hyperdense vessel Skull: Negative Other: None CT MAXILLOFACIAL FINDINGS Osseous: Fracture nasal bone with mild displacement. Nasal septum displaced to the right but without fracture. No other facial fracture. Negative mandible. Orbits: Negative for orbital fracture.  No orbital mass or edema. Sinuses: Mucosal edema in the paranasal sinuses. No air-fluid level. Mastoid and middle ear clear bilaterally Soft tissues: Mild soft tissue swelling of the nose. CT CERVICAL SPINE FINDINGS Alignment: Normal Skull base and vertebrae: Negative for fracture Soft tissues and spinal canal: Negative for soft tissue mass or edema. Disc levels: Mild disc degeneration and mild spurring throughout the cervical spine. No significant spinal stenosis Upper chest: Lung apices clear bilaterally Other: None IMPRESSION: 1. No acute intracranial abnormality. Atrophy and chronic microvascular ischemic change in the white matter. 2. Fracture of the nasal bone with  mild displacement. No other facial fracture. 3. Negative for cervical spine fracture. Electronically Signed   By: Franchot Gallo M.D.   On: 03/02/2022 15:19   CT CERVICAL SPINE WO CONTRAST  Result Date: 03/02/2022 CLINICAL DATA:  Head trauma.  Fall. EXAM: CT HEAD WITHOUT CONTRAST CT MAXILLOFACIAL WITHOUT CONTRAST CT CERVICAL SPINE WITHOUT CONTRAST TECHNIQUE: Multidetector CT imaging of the head, cervical spine, and maxillofacial structures were performed using the standard protocol without intravenous contrast. Multiplanar CT image reconstructions of the cervical spine and maxillofacial structures were also generated. RADIATION DOSE REDUCTION: This exam was performed according to the departmental dose-optimization program which includes automated exposure control, adjustment of the mA and/or kV according to patient size and/or use of iterative reconstruction technique. COMPARISON:  CT head 02/17/2021 FINDINGS: CT HEAD FINDINGS Brain: Mild atrophy. Moderate white matter hypodensity diffusely with progression since 2022. Negative for acute infarct, hemorrhage, mass Vascular: Negative for hyperdense vessel Skull: Negative Other: None CT MAXILLOFACIAL FINDINGS Osseous: Fracture nasal bone with mild displacement. Nasal septum displaced to the right but without fracture. No other facial fracture. Negative mandible. Orbits: Negative for orbital fracture.  No orbital mass or edema. Sinuses: Mucosal edema in the paranasal sinuses. No air-fluid level. Mastoid and middle ear clear bilaterally Soft tissues: Mild soft tissue swelling of the nose. CT CERVICAL SPINE FINDINGS Alignment: Normal Skull base and vertebrae: Negative for fracture Soft tissues and spinal canal: Negative for soft tissue mass or edema. Disc levels: Mild disc degeneration and mild spurring throughout the cervical spine. No significant spinal stenosis Upper chest: Lung apices clear bilaterally Other: None IMPRESSION: 1. No acute intracranial abnormality.  Atrophy and chronic microvascular ischemic change in the white matter. 2. Fracture of the nasal bone with mild displacement. No other facial fracture. 3. Negative for cervical spine fracture. Electronically Signed   By: Franchot Gallo M.D.   On: 03/02/2022 15:19   CT Maxillofacial Wo Contrast  Result Date: 03/02/2022 CLINICAL DATA:  Head trauma.  Fall. EXAM: CT HEAD WITHOUT CONTRAST CT MAXILLOFACIAL WITHOUT CONTRAST CT CERVICAL SPINE WITHOUT CONTRAST TECHNIQUE: Multidetector CT imaging of the head, cervical spine, and maxillofacial structures were performed using the standard protocol without intravenous contrast. Multiplanar CT image reconstructions of the cervical spine and maxillofacial structures were also generated. RADIATION DOSE REDUCTION: This exam was performed according to the departmental dose-optimization program which includes automated exposure control, adjustment of the mA and/or kV according to patient size and/or use of iterative reconstruction technique. COMPARISON:  CT head 02/17/2021 FINDINGS: CT HEAD FINDINGS Brain: Mild atrophy. Moderate white matter hypodensity diffusely  with progression since 2022. Negative for acute infarct, hemorrhage, mass Vascular: Negative for hyperdense vessel Skull: Negative Other: None CT MAXILLOFACIAL FINDINGS Osseous: Fracture nasal bone with mild displacement. Nasal septum displaced to the right but without fracture. No other facial fracture. Negative mandible. Orbits: Negative for orbital fracture.  No orbital mass or edema. Sinuses: Mucosal edema in the paranasal sinuses. No air-fluid level. Mastoid and middle ear clear bilaterally Soft tissues: Mild soft tissue swelling of the nose. CT CERVICAL SPINE FINDINGS Alignment: Normal Skull base and vertebrae: Negative for fracture Soft tissues and spinal canal: Negative for soft tissue mass or edema. Disc levels: Mild disc degeneration and mild spurring throughout the cervical spine. No significant spinal stenosis  Upper chest: Lung apices clear bilaterally Other: None IMPRESSION: 1. No acute intracranial abnormality. Atrophy and chronic microvascular ischemic change in the white matter. 2. Fracture of the nasal bone with mild displacement. No other facial fracture. 3. Negative for cervical spine fracture. Electronically Signed   By: Franchot Gallo M.D.   On: 03/02/2022 15:19   DG Hip Unilat With Pelvis 2-3 Views Left  Result Date: 03/02/2022 CLINICAL DATA:  Fall. EXAM: DG HIP (WITH OR WITHOUT PELVIS) 2-3V LEFT COMPARISON:  None Available. FINDINGS: Possible subcapital nondisplaced fracture left femoral neck. 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 Recommend CT. Electronically Signed   By: Franchot Gallo M.D.   On: 03/02/2022 15:12   DG Chest Port 1 View  Result Date: 03/02/2022 CLINICAL DATA:  Fall EXAM: PORTABLE CHEST 1 VIEW COMPARISON:  02/17/2021. FINDINGS: Cardiac silhouette is enlarged, even for an AP view. No pneumonia or pulmonary edema. No pneumothorax or pleural effusion. Old healed posterior right-sided fifth rib. Median sternotomy wires and prior CABG. IMPRESSION: Enlarged cardiac silhouette.  Lungs are clear. Electronically Signed   By: Sammie Bench M.D.   On: 03/02/2022 15:11    Procedures Procedures    Medications Ordered in ED Medications - No data to display  ED Course/ Medical Decision Making/ A&P Clinical Course as of 03/03/22 1628  Thu Mar 02, 2022  1529 X-ray suggest possible nondisplaced subcapital fracture of the left femoral neck [JK]  1529 Head CT and C-spine CT without acute findings.  Nasal bone fracture noted with mild displacement [JK]  1530 CBC with Differential(!) CBC unremarkable [JK]  1637 Case discussed with Dr Pietro Cassis regarding admission [JK]  1642 Case discussed [facility].  Likely OR tomorrow [JK]    Clinical Course User Index [JK] Dorie Rank, MD                            Medical Decision Making Problems Addressed: Closed fracture of left hip, initial encounter [facility]): acute illness or injury that poses a threat to life or bodily functions Closed fracture of nasal bone, initial encounter: acute illness or injury  Amount and/or Complexity of Data Reviewed Labs: ordered. Decision-making details documented in ED Course. Radiology: ordered and independent interpretation performed.  Risk Decision regarding hospitalization.   Patient presented to the ED for evaluation after mechanical fall.  Patient has some gait stability issues at baseline causing Gonzales to stumble and fall today.  Fortunately no signs of serious head injury.  Head CT without acute findings.  Nasal bone fracture noted but no significant deformity.  CT scan shows a left hip fracture.  I will consult with orthopedics and the medical service for admission and  further treatment.        Final Clinical Impression(s) / ED Diagnoses Final diagnoses:  Closed fracture of left hip, initial encounter [facility])  Closed fracture of nasal bone, initial encounter    Rx / DC Orders ED Discharge Orders     None         Dorie Rank, MD 03/03/22 1629

## 2022-03-02 NOTE — ED Triage Notes (Signed)
Pt BIB [facility] with reports of fall from standing position. Pt reports he fell and hit his head on a car then fell to the ground onto his right thigh. Pt report right thigh pain. Pt taking plavix.

## 2022-03-02 NOTE — H&P (View-Only) (Signed)
Reason for Consult: Left hip displaced femoral neck fracture Referring Physician: Elvina Sidle, EDP  Joel Gonzales. is an 76 y.o. male.  HPI: The patient is a 76 year old patient of mine that is well-known to me.  He actually had a mechanical fall last year and underwent a total hip arthroplasty for hip fracture on his right hip.  He has had some balance and coordination issues and we have had him going to physical therapy.  The last time we saw him was earlier this year.  He unfortunately sustained a mechanical fall today and injured his right hip.  He was taken to the [facility] emergency room and found to have a displaced left hip femoral neck fracture.  He is on long-term Plavix and did take Plavix today.  He only reports left hip pain as of now.  He is being admitted to the hospitalist service.  His wife is at the bedside with him as well.  Past Medical History:  Diagnosis Date   CAD (coronary artery disease)    Diabetes mellitus without complication ([place])    Type 2   Family history of ASCVD    History of stress test 01/2012   which was entirely normal   Hx of CABG    x6 LIma to his LAD, a RIMA to his PDA, right radial to obtuse marginal branch, diagnonal, second marginal, & posteolateral branches   Hyperlipemia    Hypertension    RBBB    chronic    Past Surgical History:  Procedure Laterality Date   CHOLECYSTECTOMY  2002   CORONARY ARTERY BYPASS GRAFT     x6 LIma to his LAD, a RIMA to his PDA, right radial to obtuse marginal branch, diagnonal, second marginal, & posteolateral branches   NOSE SURGERY  02/2013   carcinoma   TONSILLECTOMY     as a child   TOTAL HIP ARTHROPLASTY Right 02/18/2021   Procedure: TOTAL HIP ARTHROPLASTY ANTERIOR APPROACH;  Surgeon: Mcarthur Rossetti, MD;  Location: [facility];  Service: Orthopedics;  Laterality: Right;    Family History  Problem Relation Age of Onset   Heart attack Father 62       deceased, ASCVD   Hypertension Father    Heart  disease Father    Heart Problems Mother        ASCVD   Hypertension Mother    Heart attack Mother    Heart attack Sister    Hypertension Brother    Hydrocephalus Brother     Social History:  reports that he has never smoked. He has never used smokeless tobacco. He reports current alcohol use. He reports that he does not use drugs.  Allergies: No Known Allergies  Medications: I have reviewed the patient's current medications.  Results for orders placed or performed during the hospital encounter of 03/02/22 (from the past 48 hour(s))  Basic metabolic panel     Status: Abnormal   Collection Time: 03/02/22  2:40 PM  Result Value Ref Range   Sodium 141 135 - 145 mmol/L   Potassium 4.0 3.5 - 5.1 mmol/L   Chloride 106 98 - 111 mmol/L   CO2 24 22 - 32 mmol/L   Glucose, Bld 189 (H) 70 - 99 mg/dL    Comment: Glucose reference range applies only to samples taken after fasting for at least 8 hours.   BUN 29 (H) 8 - 23 mg/dL   Creatinine, Ser 1.19 0.61 - 1.24 mg/dL   Calcium 9.4 8.9 -  10.3 mg/dL   GFR, Estimated >60 >60 mL/min    Comment: (NOTE) Calculated using the CKD-EPI Creatinine Equation (2021)    Anion gap 11 5 - 15    Comment: Performed at [facility], [place] [address] [place], [place] [address]  CBC with Differential     Status: Abnormal   Collection Time: 03/02/22  2:40 PM  Result Value Ref Range   WBC 11.3 (H) 4.0 - 10.5 K/uL   RBC 4.71 4.22 - 5.81 MIL/uL   Hemoglobin 15.1 13.0 - 17.0 g/dL   HCT 45.9 39.0 - 52.0 %   MCV 97.5 80.0 - 100.0 fL   MCH 32.1 26.0 - 34.0 pg   MCHC 32.9 30.0 - 36.0 g/dL   RDW 13.3 11.5 - 15.5 %   Platelets 219 150 - 400 K/uL   nRBC 0.0 0.0 - 0.2 %   Neutrophils Relative % 89 %   Neutro Abs 10.0 (H) 1.7 - 7.7 K/uL   Lymphocytes Relative 7 %   Lymphs Abs 0.8 0.7 - 4.0 K/uL   Monocytes Relative 4 %   Monocytes Absolute 0.4 0.1 - 1.0 K/uL   Eosinophils Relative 0 %   Eosinophils Absolute 0.0 0.0 - 0.5 K/uL   Basophils  Relative 0 %   Basophils Absolute 0.1 0.0 - 0.1 K/uL   Immature Granulocytes 0 %   Abs Immature Granulocytes 0.04 0.00 - 0.07 K/uL    Comment: Performed at [facility], [place] [address], [place], [place] [address]  Protime-INR     Status: None   Collection Time: 03/02/22  2:40 PM  Result Value Ref Range   Prothrombin Time 13.8 11.4 - 15.2 seconds   INR 1.1 0.8 - 1.2    Comment: (NOTE) INR goal varies based on device and disease states. Performed at [facility], [place] [address], [place], [place] [address]   Type and screen [place]     Status: None   Collection Time: 03/02/22  2:40 PM  Result Value Ref Range   ABO/RH(D) A POS    Antibody Screen NEG    Sample Expiration      03/05/2022,2359 Performed at [facility] [address], [place], [place] [address]     CT Hip Left Wo Contrast  Result Date: 03/02/2022 CLINICAL DATA:  Left femoral neck fracture after a fall EXAM: CT OF THE LEFT HIP WITHOUT CONTRAST TECHNIQUE: Multidetector CT imaging of the left hip was performed according to the standard protocol. Multiplanar CT image reconstructions were also generated. RADIATION DOSE REDUCTION: This exam was performed according to the departmental dose-optimization program which includes automated exposure control, adjustment of the mA and/or kV according to patient size and/or use of iterative reconstruction technique. COMPARISON:  03/02/2022 FINDINGS: Bones/Joint/Cartilage Acute intra-articular left femoral neck fracture with transcervical and subcapital components and mild apex anterior angulation. No other regional fracture identified. Currently no significant joint effusion. Mild spurring of the left SI joint anteriorly. Ligaments Suboptimally assessed by CT. Muscles and Tendons Unremarkable Soft tissues Aortoiliac atherosclerotic vascular calcification. IMPRESSION: 1. Acute intra-articular left femoral neck  fracture with transcervical and subcapital components and mild apex anterior angulation. 2. Aortic atherosclerosis. Aortic Atherosclerosis (ICD10-I70.0). Electronically Signed   By: Van Clines M.D.   On: 03/02/2022 16:09   CT HEAD WO CONTRAST  Result Date: 03/02/2022 CLINICAL DATA:  Head trauma.  Fall. EXAM: CT HEAD WITHOUT CONTRAST CT MAXILLOFACIAL WITHOUT CONTRAST CT CERVICAL SPINE WITHOUT CONTRAST  TECHNIQUE: Multidetector CT imaging of the head, cervical spine, and maxillofacial structures were performed using the standard protocol without intravenous contrast. Multiplanar CT image reconstructions of the cervical spine and maxillofacial structures were also generated. RADIATION DOSE REDUCTION: This exam was performed according to the departmental dose-optimization program which includes automated exposure control, adjustment of the mA and/or kV according to patient size and/or use of iterative reconstruction technique. COMPARISON:  CT head 02/17/2021 FINDINGS: CT HEAD FINDINGS Brain: Mild atrophy. Moderate white matter hypodensity diffusely with progression since 2022. Negative for acute infarct, hemorrhage, mass Vascular: Negative for hyperdense vessel Skull: Negative Other: None CT MAXILLOFACIAL FINDINGS Osseous: Fracture nasal bone with mild displacement. Nasal septum displaced to the right but without fracture. No other facial fracture. Negative mandible. Orbits: Negative for orbital fracture.  No orbital mass or edema. Sinuses: Mucosal edema in the paranasal sinuses. No air-fluid level. Mastoid and middle ear clear bilaterally Soft tissues: Mild soft tissue swelling of the nose. CT CERVICAL SPINE FINDINGS Alignment: Normal Skull base and vertebrae: Negative for fracture Soft tissues and spinal canal: Negative for soft tissue mass or edema. Disc levels: Mild disc degeneration and mild spurring throughout the cervical spine. No significant spinal stenosis Upper chest: Lung apices clear bilaterally  Other: None IMPRESSION: 1. No acute intracranial abnormality. Atrophy and chronic microvascular ischemic change in the white matter. 2. Fracture of the nasal bone with mild displacement. No other facial fracture. 3. Negative for cervical spine fracture. Electronically Signed   By: Franchot Gallo M.D.   On: 03/02/2022 15:19   CT CERVICAL SPINE WO CONTRAST  Result Date: 03/02/2022 CLINICAL DATA:  Head trauma.  Fall. EXAM: CT HEAD WITHOUT CONTRAST CT MAXILLOFACIAL WITHOUT CONTRAST CT CERVICAL SPINE WITHOUT CONTRAST TECHNIQUE: Multidetector CT imaging of the head, cervical spine, and maxillofacial structures were performed using the standard protocol without intravenous contrast. Multiplanar CT image reconstructions of the cervical spine and maxillofacial structures were also generated. RADIATION DOSE REDUCTION: This exam was performed according to the departmental dose-optimization program which includes automated exposure control, adjustment of the mA and/or kV according to patient size and/or use of iterative reconstruction technique. COMPARISON:  CT head 02/17/2021 FINDINGS: CT HEAD FINDINGS Brain: Mild atrophy. Moderate white matter hypodensity diffusely with progression since 2022. Negative for acute infarct, hemorrhage, mass Vascular: Negative for hyperdense vessel Skull: Negative Other: None CT MAXILLOFACIAL FINDINGS Osseous: Fracture nasal bone with mild displacement. Nasal septum displaced to the right but without fracture. No other facial fracture. Negative mandible. Orbits: Negative for orbital fracture.  No orbital mass or edema. Sinuses: Mucosal edema in the paranasal sinuses. No air-fluid level. Mastoid and middle ear clear bilaterally Soft tissues: Mild soft tissue swelling of the nose. CT CERVICAL SPINE FINDINGS Alignment: Normal Skull base and vertebrae: Negative for fracture Soft tissues and spinal canal: Negative for soft tissue mass or edema. Disc levels: Mild disc degeneration and mild  spurring throughout the cervical spine. No significant spinal stenosis Upper chest: Lung apices clear bilaterally Other: None IMPRESSION: 1. No acute intracranial abnormality. Atrophy and chronic microvascular ischemic change in the white matter. 2. Fracture of the nasal bone with mild displacement. No other facial fracture. 3. Negative for cervical spine fracture. Electronically Signed   By: Franchot Gallo M.D.   On: 03/02/2022 15:19   CT Maxillofacial Wo Contrast  Result Date: 03/02/2022 CLINICAL DATA:  Head trauma.  Fall. EXAM: CT HEAD WITHOUT CONTRAST CT MAXILLOFACIAL WITHOUT CONTRAST CT CERVICAL SPINE WITHOUT CONTRAST TECHNIQUE: Multidetector CT imaging of the head,  cervical spine, and maxillofacial structures were performed using the standard protocol without intravenous contrast. Multiplanar CT image reconstructions of the cervical spine and maxillofacial structures were also generated. RADIATION DOSE REDUCTION: This exam was performed according to the departmental dose-optimization program which includes automated exposure control, adjustment of the mA and/or kV according to patient size and/or use of iterative reconstruction technique. COMPARISON:  CT head 02/17/2021 FINDINGS: CT HEAD FINDINGS Brain: Mild atrophy. Moderate white matter hypodensity diffusely with progression since 2022. Negative for acute infarct, hemorrhage, mass Vascular: Negative for hyperdense vessel Skull: Negative Other: None CT MAXILLOFACIAL FINDINGS Osseous: Fracture nasal bone with mild displacement. Nasal septum displaced to the right but without fracture. No other facial fracture. Negative mandible. Orbits: Negative for orbital fracture.  No orbital mass or edema. Sinuses: Mucosal edema in the paranasal sinuses. No air-fluid level. Mastoid and middle ear clear bilaterally Soft tissues: Mild soft tissue swelling of the nose. CT CERVICAL SPINE FINDINGS Alignment: Normal Skull base and vertebrae: Negative for fracture Soft  tissues and spinal canal: Negative for soft tissue mass or edema. Disc levels: Mild disc degeneration and mild spurring throughout the cervical spine. No significant spinal stenosis Upper chest: Lung apices clear bilaterally Other: None IMPRESSION: 1. No acute intracranial abnormality. Atrophy and chronic microvascular ischemic change in the white matter. 2. Fracture of the nasal bone with mild displacement. No other facial fracture. 3. Negative for cervical spine fracture. Electronically Signed   By: Franchot Gallo M.D.   On: 03/02/2022 15:19   DG Hip Unilat With Pelvis 2-3 Views Left  Result Date: 03/02/2022 CLINICAL DATA:  Fall. EXAM: DG HIP (WITH OR WITHOUT PELVIS) 2-3V LEFT COMPARISON:  None Available. FINDINGS: Possible subcapital nondisplaced fracture left femoral neck. 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 Recommend CT. Electronically Signed   By: Franchot Gallo M.D.   On: 03/02/2022 15:12   DG Chest Port 1 View  Result Date: 03/02/2022 CLINICAL DATA:  Fall EXAM: PORTABLE CHEST 1 VIEW COMPARISON:  02/17/2021. FINDINGS: Cardiac silhouette is enlarged, even for an AP view. No pneumonia or pulmonary edema. No pneumothorax or pleural effusion. Old healed posterior right-sided fifth rib. Median sternotomy wires and prior CABG. IMPRESSION: Enlarged cardiac silhouette.  Lungs are clear. Electronically Signed   By: Sammie Bench M.D.   On: 03/02/2022 15:11     X-rays independently reviewed show a displaced subcapital left femoral neck fracture.  Review of Systems  All other systems reviewed and are negative.  Blood pressure 135/71, pulse 73, temperature 97.6 F (36.4 C), temperature source Oral, resp. rate 16, SpO2 95 %. Physical Exam Vitals reviewed.  Constitutional:      Appearance: Normal appearance. He is normal weight.  HENT:     Head: Normocephalic and atraumatic.  Eyes:      Extraocular Movements: Extraocular movements intact.     Pupils: Pupils are equal, round, and reactive to light.  Cardiovascular:     Rate and Rhythm: Normal rate.  Pulmonary:     Effort: Pulmonary effort is normal.     Breath sounds: Normal breath sounds.  Abdominal:     Palpations: Abdomen is soft.  Musculoskeletal:     Cervical back: Normal range of motion and neck supple.     Right hip: Tenderness and bony tenderness present. Decreased range of motion. Decreased strength.  Neurological:     Mental Status: He is alert and oriented to person, place, and time.  Psychiatric:        Behavior: Behavior normal.   His left lower extremity is slightly shortened and externally rotated.  Assessment/Plan: Left hip displaced subcapital femoral neck fracture  Just as we did for his right hip last year, our plan is to proceed to surgery later tomorrow afternoon for a left total hip arthroplasty.  Given the displaced nature of his fracture this is warranted for him.  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  The risk and benefits of surgery been explained in detail.  Obviously there is a high risk of bleeding given him being on Plavix.  However we cannot delay the surgery for 7 days to get the Plavix out of his system.  This was the same as last year while we replaced his right hip.  He can eat for now and will be n.p.o. after midnight in anticipation of surgery tomorrow afternoon on his left hip.  Mcarthur Rossetti 03/02/2022, 5:46 PM

## 2022-03-02 NOTE — H&P (Signed)
[facility] History and Physical  [name]. IRS:[id number] DOB: 13-Apr-1945 DOA: 03/02/2022 PCP: Haywood Pao, MD  Admitted from: Home Chief Complaint: Fall  History of Present Illness: Joel Gonzales. is a 76 y.o. 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 Today, patient was brought into ED after an episode of fall. Patient was diagnosed with vascular parkinsonism 4 years ago at [facility].  He has trouble with his gait.  He sometimes has trouble stopping and tends to fall forward.  Today he was walking, fell forward hitting his face against a parked car and fell to the ground impacting his left hip.  He sustained a scrap to his nose and forehead and started hurting in the left hip and is brought to the ED. In the ED, hemodynamically stable, breathing on room air. Labs with WC count 11.3, hemoglobin 15.1, platelet 219, BUN/creatinine 29/1.19, glucose level elevated to 189. CT scan of left hip showed an acute intra-articular left femoral neck fracture with transcervical and subcapital components and mild apex anterior angulation. CT scan of head, cervical spine and maxillofacial were also obtained. No acute intracranial abnormality. Negative for cervical spine fracture. Atrophy and chronic microvascular ischemic change in the white matter. Fracture of the nasal bone with mild displacement. No other facial fracture.  ED physician discussed the case with Orthopedics Dr. [facility] service consulted for admission and management.  At the time of my evaluation, patient was lying on bed.  Pain controlled.  Wife was at bedside. Last dose of Plavix was this morning.  Underwent right hip surgery last year by Dr. Ninfa Linden.  Postprocedure, he had disorientation for about 2 to 3 days.  Recovered subsequently.  Review of Systems:  All systems were reviewed and were negative unless  otherwise mentioned in the HPI   Past medical history: Past Medical History:  Diagnosis Date   CAD (coronary artery disease)    Diabetes mellitus without complication ([place])    Type 2   Family history of ASCVD    History of stress test 01/2012   which was entirely normal   Hx of CABG    x6 LIma to his LAD, a RIMA to his PDA, right radial to obtuse marginal branch, diagnonal, second marginal, & posteolateral branches   Hyperlipemia    Hypertension    RBBB    chronic    Past surgical history: Past Surgical History:  Procedure Laterality Date   CHOLECYSTECTOMY  2002   CORONARY ARTERY BYPASS GRAFT     x6 LIma to his LAD, a RIMA to his PDA, right radial to obtuse marginal branch, diagnonal, second marginal, & posteolateral branches   NOSE SURGERY  02/2013   carcinoma   TONSILLECTOMY     as a child   TOTAL HIP ARTHROPLASTY Right 02/18/2021   Procedure: TOTAL HIP ARTHROPLASTY ANTERIOR APPROACH;  Surgeon: Mcarthur Rossetti, MD;  Location: [place];  Service: Orthopedics;  Laterality: Right;    Social History:  reports that he has never smoked. He has never used smokeless tobacco. He reports current alcohol use. He reports that he does not use drugs.  Allergies:  No Known Allergies Patient has no known allergies.   Family history:  Family History  Problem Relation Age of Onset   Heart attack Father 66       deceased, ASCVD   Hypertension Father    Heart  disease Father    Heart Problems Mother        ASCVD   Hypertension Mother    Heart attack Mother    Heart attack Sister    Hypertension Brother    Hydrocephalus Brother      Home Meds: Prior to Admission medications   Medication Sig Start Date End Date Taking? Authorizing Provider  alendronate (FOSAMAX) 70 MG tablet Take 70 mg by mouth once a week. 12/02/21   [provider]  atorvastatin (LIPITOR) 80 MG tablet Take 80 mg by mouth at bedtime. 02/09/21   [provider]  carbidopa-levodopa  (SINEMET IR) 25-100 MG tablet Take 1 tablet by mouth 2 (two) times daily. Take on an empty stomach or with crackers if makes nauseated, If have side effects try 1/2 tablet twice daily. 03/01/22   Melvenia Beam, MD  clopidogrel (PLAVIX) 75 MG tablet Take 1 tablet (75 mg total) by mouth daily. 03/06/17   Debbe Odea, MD  Empagliflozin-metFORMIN HCl ER (SYNJARDY XR) 25-1000 MG TB24 Take 1 tablet by mouth daily.    [provider]  metoprolol succinate (TOPROL-XL) 50 MG 24 hr tablet TAKE 1 TABLET WITH OR IMMEDIATELY FOLLOWING A MEAL ONCE A DAY. Patient taking differently: 50 mg daily. 08/02/17   Lorretta Harp, MD  pantoprazole (PROTONIX) 40 MG tablet Take 1 tablet (40 mg total) by mouth daily. 02/23/15   Lorretta Harp, MD  pioglitazone (ACTOS) 15 MG tablet Take 15 mg by mouth daily.    [provider]  ramipril (ALTACE) 5 MG capsule TAKE 1 CAPSULE BY MOUTH TWICE DAILY 07/14/21   Lorretta Harp, MD    Physical Exam: Vitals:   03/02/22 2355 03/02/22 1430 03/02/22 1607 03/02/22 1630  BP: 133/78 130/70 125/70 135/71  Pulse:  77 76 73  Resp:  '19 19 16  '$ Temp:      TempSrc:      SpO2:  96% 96% 95%   Wt Readings from Last 3 Encounters:  03/01/22 89.4 kg  10/18/21 86.6 kg  05/03/21 86.4 kg   There is no height or weight on file to calculate BMI.  General exam: Pleasant, elderly Caucasian male.  Pain controlled Skin: No rashes, lesions or ulcers. HEENT: Swollen nose because of nasal bone fracture, normocephalic, no obvious bleeding Lungs: Clear to auscultation bilaterally CVS: Regular rate and rhythm, no murmur.  Mid line scar in the chest from bypass surgery in the past GI/Abd soft, nontender, nondistended, bowel sound. CNS: Alert, awake, oriented x 3 Psychiatry: Mood appropriate Extremities: No pedal edema, no calf tenderness     Labs on Admission:   CBC: Recent Labs  Lab 03/02/22 1440  WBC 11.3*  NEUTROABS 10.0*  HGB 15.1  HCT 45.9  MCV 97.5  PLT  732    Basic Metabolic Panel: Recent Labs  Lab 03/02/22 1440  NA 141  K 4.0  CL 106  CO2 24  GLUCOSE 189*  BUN 29*  CREATININE 1.19  CALCIUM 9.4    Liver Function Tests: No results for input(s): "AST", "ALT", "ALKPHOS", "BILITOT", "PROT", "ALBUMIN" in the last 168 hours. No results for input(s): "LIPASE", "AMYLASE" in the last 168 hours. No results for input(s): "AMMONIA" in the last 168 hours.  Cardiac Enzymes: No results for input(s): "CKTOTAL", "CKMB", "CKMBINDEX", "TROPONINI" in the last 168 hours.  BNP (last 3 results) No results for input(s): "BNP" in the last 8760 hours.  ProBNP (last 3 results) No results for input(s): "PROBNP" in the last  8760 hours.  CBG: No results for input(s): "GLUCAP" in the last 168 hours.  Lipase  No results found for: "LIPASE"   Urinalysis    Component Value Date/Time   COLORURINE STRAW (A) 03/04/2017 1823   APPEARANCEUR CLEAR 03/04/2017 1823   LABSPEC 1.004 (L) 03/04/2017 1823   PHURINE 6.0 03/04/2017 1823   GLUCOSEU NEGATIVE 03/04/2017 1823   HGBUR NEGATIVE 03/04/2017 1823   BILIRUBINUR NEGATIVE 03/04/2017 1823   KETONESUR NEGATIVE 03/04/2017 1823   PROTEINUR NEGATIVE 03/04/2017 1823   NITRITE NEGATIVE 03/04/2017 1823   LEUKOCYTESUR NEGATIVE 03/04/2017 1823     Drugs of Abuse     Component Value Date/Time   LABOPIA NONE DETECTED 03/04/2017 1823   COCAINSCRNUR NONE DETECTED 03/04/2017 1823   LABBENZ NONE DETECTED 03/04/2017 1823   AMPHETMU NONE DETECTED 03/04/2017 1823   THCU NONE DETECTED 03/04/2017 1823   LABBARB NONE DETECTED 03/04/2017 1823      Radiological Exams on Admission: CT Hip Left Wo Contrast  Result Date: 03/02/2022 CLINICAL DATA:  Left femoral neck fracture after a fall EXAM: CT OF THE LEFT HIP WITHOUT CONTRAST TECHNIQUE: Multidetector CT imaging of the left hip was performed according to the standard protocol. Multiplanar CT image reconstructions were also generated. RADIATION DOSE REDUCTION: This  exam was performed according to the departmental dose-optimization program which includes automated exposure control, adjustment of the mA and/or kV according to patient size and/or use of iterative reconstruction technique. COMPARISON:  03/02/2022 FINDINGS: Bones/Joint/Cartilage Acute intra-articular left femoral neck fracture with transcervical and subcapital components and mild apex anterior angulation. No other regional fracture identified. Currently no significant joint effusion. Mild spurring of the left SI joint anteriorly. Ligaments Suboptimally assessed by CT. Muscles and Tendons Unremarkable Soft tissues Aortoiliac atherosclerotic vascular calcification. IMPRESSION: 1. Acute intra-articular left femoral neck fracture with transcervical and subcapital components and mild apex anterior angulation. 2. Aortic atherosclerosis. Aortic Atherosclerosis (ICD10-I70.0). Electronically Signed   By: Van Clines M.D.   On: 03/02/2022 16:09   CT HEAD WO CONTRAST  Result Date: 03/02/2022 CLINICAL DATA:  Head trauma.  Fall. EXAM: CT HEAD WITHOUT CONTRAST CT MAXILLOFACIAL WITHOUT CONTRAST CT CERVICAL SPINE WITHOUT CONTRAST TECHNIQUE: Multidetector CT imaging of the head, cervical spine, and maxillofacial structures were performed using the standard protocol without intravenous contrast. Multiplanar CT image reconstructions of the cervical spine and maxillofacial structures were also generated. RADIATION DOSE REDUCTION: This exam was performed according to the departmental dose-optimization program which includes automated exposure control, adjustment of the mA and/or kV according to patient size and/or use of iterative reconstruction technique. COMPARISON:  CT head 02/17/2021 FINDINGS: CT HEAD FINDINGS Brain: Mild atrophy. Moderate white matter hypodensity diffusely with progression since 2022. Negative for acute infarct, hemorrhage, mass Vascular: Negative for hyperdense vessel Skull: Negative Other: None CT  MAXILLOFACIAL FINDINGS Osseous: Fracture nasal bone with mild displacement. Nasal septum displaced to the right but without fracture. No other facial fracture. Negative mandible. Orbits: Negative for orbital fracture.  No orbital mass or edema. Sinuses: Mucosal edema in the paranasal sinuses. No air-fluid level. Mastoid and middle ear clear bilaterally Soft tissues: Mild soft tissue swelling of the nose. CT CERVICAL SPINE FINDINGS Alignment: Normal Skull base and vertebrae: Negative for fracture Soft tissues and spinal canal: Negative for soft tissue mass or edema. Disc levels: Mild disc degeneration and mild spurring throughout the cervical spine. No significant spinal stenosis Upper chest: Lung apices clear bilaterally Other: None IMPRESSION: 1. No acute intracranial abnormality. Atrophy and chronic microvascular ischemic change in  the white matter. 2. Fracture of the nasal bone with mild displacement. No other facial fracture. 3. Negative for cervical spine fracture. Electronically Signed   By: Franchot Gallo M.D.   On: 03/02/2022 15:19   CT CERVICAL SPINE WO CONTRAST  Result Date: 03/02/2022 CLINICAL DATA:  Head trauma.  Fall. EXAM: CT HEAD WITHOUT CONTRAST CT MAXILLOFACIAL WITHOUT CONTRAST CT CERVICAL SPINE WITHOUT CONTRAST TECHNIQUE: Multidetector CT imaging of the head, cervical spine, and maxillofacial structures were performed using the standard protocol without intravenous contrast. Multiplanar CT image reconstructions of the cervical spine and maxillofacial structures were also generated. RADIATION DOSE REDUCTION: This exam was performed according to the departmental dose-optimization program which includes automated exposure control, adjustment of the mA and/or kV according to patient size and/or use of iterative reconstruction technique. COMPARISON:  CT head 02/17/2021 FINDINGS: CT HEAD FINDINGS Brain: Mild atrophy. Moderate white matter hypodensity diffusely with progression since 2022. Negative  for acute infarct, hemorrhage, mass Vascular: Negative for hyperdense vessel Skull: Negative Other: None CT MAXILLOFACIAL FINDINGS Osseous: Fracture nasal bone with mild displacement. Nasal septum displaced to the right but without fracture. No other facial fracture. Negative mandible. Orbits: Negative for orbital fracture.  No orbital mass or edema. Sinuses: Mucosal edema in the paranasal sinuses. No air-fluid level. Mastoid and middle ear clear bilaterally Soft tissues: Mild soft tissue swelling of the nose. CT CERVICAL SPINE FINDINGS Alignment: Normal Skull base and vertebrae: Negative for fracture Soft tissues and spinal canal: Negative for soft tissue mass or edema. Disc levels: Mild disc degeneration and mild spurring throughout the cervical spine. No significant spinal stenosis Upper chest: Lung apices clear bilaterally Other: None IMPRESSION: 1. No acute intracranial abnormality. Atrophy and chronic microvascular ischemic change in the white matter. 2. Fracture of the nasal bone with mild displacement. No other facial fracture. 3. Negative for cervical spine fracture. Electronically Signed   By: Franchot Gallo M.D.   On: 03/02/2022 15:19   CT Maxillofacial Wo Contrast  Result Date: 03/02/2022 CLINICAL DATA:  Head trauma.  Fall. EXAM: CT HEAD WITHOUT CONTRAST CT MAXILLOFACIAL WITHOUT CONTRAST CT CERVICAL SPINE WITHOUT CONTRAST TECHNIQUE: Multidetector CT imaging of the head, cervical spine, and maxillofacial structures were performed using the standard protocol without intravenous contrast. Multiplanar CT image reconstructions of the cervical spine and maxillofacial structures were also generated. RADIATION DOSE REDUCTION: This exam was performed according to the departmental dose-optimization program which includes automated exposure control, adjustment of the mA and/or kV according to patient size and/or use of iterative reconstruction technique. COMPARISON:  CT head 02/17/2021 FINDINGS: CT HEAD  FINDINGS Brain: Mild atrophy. Moderate white matter hypodensity diffusely with progression since 2022. Negative for acute infarct, hemorrhage, mass Vascular: Negative for hyperdense vessel Skull: Negative Other: None CT MAXILLOFACIAL FINDINGS Osseous: Fracture nasal bone with mild displacement. Nasal septum displaced to the right but without fracture. No other facial fracture. Negative mandible. Orbits: Negative for orbital fracture.  No orbital mass or edema. Sinuses: Mucosal edema in the paranasal sinuses. No air-fluid level. Mastoid and middle ear clear bilaterally Soft tissues: Mild soft tissue swelling of the nose. CT CERVICAL SPINE FINDINGS Alignment: Normal Skull base and vertebrae: Negative for fracture Soft tissues and spinal canal: Negative for soft tissue mass or edema. Disc levels: Mild disc degeneration and mild spurring throughout the cervical spine. No significant spinal stenosis Upper chest: Lung apices clear bilaterally Other: None IMPRESSION: 1. No acute intracranial abnormality. Atrophy and chronic microvascular ischemic change in the white matter. 2. Fracture of the  nasal bone with mild displacement. No other facial fracture. 3. Negative for cervical spine fracture. Electronically Signed   By: Franchot Gallo M.D.   On: 03/02/2022 15:19   DG Hip Unilat With Pelvis 2-3 Views Left  Result Date: 03/02/2022 CLINICAL DATA:  Fall. EXAM: DG HIP (WITH OR WITHOUT PELVIS) 2-3V LEFT COMPARISON:  None Available. FINDINGS: Possible subcapital nondisplaced fracture left femoral neck. 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 Recommend CT. Electronically Signed   By: Franchot Gallo M.D.   On: 03/02/2022 15:12   DG Chest Port 1 View  Result Date: 03/02/2022 CLINICAL DATA:  Fall EXAM: PORTABLE CHEST 1 VIEW COMPARISON:  02/17/2021. FINDINGS: Cardiac silhouette is enlarged, even for an AP view. No  pneumonia or pulmonary edema. No pneumothorax or pleural effusion. Old healed posterior right-sided fifth rib. Median sternotomy wires and prior CABG. IMPRESSION: Enlarged cardiac silhouette.  Lungs are clear. Electronically Signed   By: Sammie Bench M.D.   On: 03/02/2022 15:11     ------------------------------------------------------------------------------------------------------ Assessment/Plan: Principal Problem:   Closed right hip fracture (HCC)  Acute intra-articular left femoral neck fracture Secondary to fall from losing balance Imaging as above Orthopedics Dr. Ninfa Linden called from ED Tentative plan of surgical fixation tomorrow.  N.p.o. after midnight.  Plavix on hold. Will order IV morphine/oral oxycodone as needed for pain control DVT prophylaxis with Lovenox subcu, I ordered to start tomorrow night. Ordered cardiac/diabetic diet for now.   Perioperative risk assessment Patient had CABG done several years ago and follows up with Dr. Gwenlyn Found.  No cardiac symptoms in several years. He had right hip replacement done last year at [facility].  No cardiac event.  He had postop confusion for 2 to 3 days which resolved eventually. Currently with no ischemic symptoms.  Will obtain EKG to look for Qtc. No other cardiac testing done prior to procedure. At acceptable risk for planned procedure.  Patient and his wife understand and are agreeable to the procedure.  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  He has not started it yet.  Will wait till after the procedure to start it.  Nasal bone fracture Mild swelling noted.  No external injury.  Pain controlled.  No need of surgical intervention  Type 2 diabetes mellitus A1c 6.9 in 2022. PTA on Synjardy, Actos Hold oral meds.  Start on sliding scale insulin with Accu-Cheks No results  for input(s): "GLUCAP" in the last 168 hours.  CAD/CABG  HLD  PTA on metoprolol, Plavix, statin  Resume metoprolol.  Keep others on hold   Essential hypertension On Toprol 50 mg daily and ramipril 5 mg twice daily Continue Toprol.  Keep ramipril on hold. Continue monitor blood pressure.  Hydralazine IV as needed  Mobility: Needs PT eval after surgery  Goals of care   Code Status: Prior   Diet: Diet Order             Diet NPO time specified  Diet effective now                   Nutritional status:  There is no height or weight on file to calculate BMI.       DVT prophylaxis: Lovenox subcu    Antimicrobials: None Fluid: NS at 75 mill per hour Consultants: Orthopedics Family Communication: Wife at bedside Dispo:  The patient is from: Home              Anticipated d/c is to: Pending clinical course.  ------------------------------------------------------------------------------------- Severity of Illness: The appropriate patient status for this patient is OBSERVATION. Observation status is judged to be reasonable and necessary in order to provide the required intensity of service to ensure the patient's safety. The patient's presenting symptoms, physical exam findings, and initial radiographic and laboratory data in the context of their medical condition is felt to place them at decreased risk for further clinical deterioration. Furthermore, it is anticipated that the patient will be medically stable for discharge from the hospital within 2 midnights of admission.   Signed, Terrilee Croak, MD [facility] 03/02/2022

## 2022-03-02 NOTE — Consult Note (Signed)
Reason for Consult: Left hip displaced femoral neck fracture Referring Physician: Elvina Sidle, EDP  Joel Gonzales. is an 76 y.o. male.  HPI: The patient is a 76 year old patient of mine that is well-known to me.  He actually had a mechanical fall last year and underwent a total hip arthroplasty for hip fracture on his right hip.  He has had some balance and coordination issues and we have had him going to physical therapy.  The last time we saw him was earlier this year.  He unfortunately sustained a mechanical fall today and injured his right hip.  He was taken to the [facility] emergency room and found to have a displaced left hip femoral neck fracture.  He is on long-term Plavix and did take Plavix today.  He only reports left hip pain as of now.  He is being admitted to the hospitalist service.  His wife is at the bedside with him as well.  Past Medical History:  Diagnosis Date   CAD (coronary artery disease)    Diabetes mellitus without complication ([place])    Type 2   Family history of ASCVD    History of stress test 01/2012   which was entirely normal   Hx of CABG    x6 LIma to his LAD, a RIMA to his PDA, right radial to obtuse marginal branch, diagnonal, second marginal, & posteolateral branches   Hyperlipemia    Hypertension    RBBB    chronic    Past Surgical History:  Procedure Laterality Date   CHOLECYSTECTOMY  2002   CORONARY ARTERY BYPASS GRAFT     x6 LIma to his LAD, a RIMA to his PDA, right radial to obtuse marginal branch, diagnonal, second marginal, & posteolateral branches   NOSE SURGERY  02/2013   carcinoma   TONSILLECTOMY     as a child   TOTAL HIP ARTHROPLASTY Right 02/18/2021   Procedure: TOTAL HIP ARTHROPLASTY ANTERIOR APPROACH;  Surgeon: Mcarthur Rossetti, MD;  Location: [facility];  Service: Orthopedics;  Laterality: Right;    Family History  Problem Relation Age of Onset   Heart attack Father 69       deceased, ASCVD   Hypertension Father    Heart  disease Father    Heart Problems Mother        ASCVD   Hypertension Mother    Heart attack Mother    Heart attack Sister    Hypertension Brother    Hydrocephalus Brother     Social History:  reports that he has never smoked. He has never used smokeless tobacco. He reports current alcohol use. He reports that he does not use drugs.  Allergies: No Known Allergies  Medications: I have reviewed the patient's current medications.  Results for orders placed or performed during the hospital encounter of 03/02/22 (from the past 48 hour(s))  Basic metabolic panel     Status: Abnormal   Collection Time: 03/02/22  2:40 PM  Result Value Ref Range   Sodium 141 135 - 145 mmol/L   Potassium 4.0 3.5 - 5.1 mmol/L   Chloride 106 98 - 111 mmol/L   CO2 24 22 - 32 mmol/L   Glucose, Bld 189 (H) 70 - 99 mg/dL    Comment: Glucose reference range applies only to samples taken after fasting for at least 8 hours.   BUN 29 (H) 8 - 23 mg/dL   Creatinine, Ser 1.19 0.61 - 1.24 mg/dL   Calcium 9.4 8.9 -  10.3 mg/dL   GFR, Estimated >60 >60 mL/min    Comment: (NOTE) Calculated using the CKD-EPI Creatinine Equation (2021)    Anion gap 11 5 - 15    Comment: Performed at [facility], [place] [address], [place], [place] [address]  CBC with Differential     Status: Abnormal   Collection Time: 03/02/22  2:40 PM  Result Value Ref Range   WBC 11.3 (H) 4.0 - 10.5 K/uL   RBC 4.71 4.22 - 5.81 MIL/uL   Hemoglobin 15.1 13.0 - 17.0 g/dL   HCT 45.9 39.0 - 52.0 %   MCV 97.5 80.0 - 100.0 fL   MCH 32.1 26.0 - 34.0 pg   MCHC 32.9 30.0 - 36.0 g/dL   RDW 13.3 11.5 - 15.5 %   Platelets 219 150 - 400 K/uL   nRBC 0.0 0.0 - 0.2 %   Neutrophils Relative % 89 %   Neutro Abs 10.0 (H) 1.7 - 7.7 K/uL   Lymphocytes Relative 7 %   Lymphs Abs 0.8 0.7 - 4.0 K/uL   Monocytes Relative 4 %   Monocytes Absolute 0.4 0.1 - 1.0 K/uL   Eosinophils Relative 0 %   Eosinophils Absolute 0.0 0.0 - 0.5 K/uL   Basophils  Relative 0 %   Basophils Absolute 0.1 0.0 - 0.1 K/uL   Immature Granulocytes 0 %   Abs Immature Granulocytes 0.04 0.00 - 0.07 K/uL    Comment: Performed at [facility], [place] [address] [place], [place] [address]  Protime-INR     Status: None   Collection Time: 03/02/22  2:40 PM  Result Value Ref Range   Prothrombin Time 13.8 11.4 - 15.2 seconds   INR 1.1 0.8 - 1.2    Comment: (NOTE) INR goal varies based on device and disease states. Performed at [facility], [place] [address], [place], [place] [address]   Type and screen [place]     Status: None   Collection Time: 03/02/22  2:40 PM  Result Value Ref Range   ABO/RH(D) A POS    Antibody Screen NEG    Sample Expiration      03/05/2022,2359 Performed at [facility] [address] [place], [place] [address]     CT Hip Left Wo Contrast  Result Date: 03/02/2022 CLINICAL DATA:  Left femoral neck fracture after a fall EXAM: CT OF THE LEFT HIP WITHOUT CONTRAST TECHNIQUE: Multidetector CT imaging of the left hip was performed according to the standard protocol. Multiplanar CT image reconstructions were also generated. RADIATION DOSE REDUCTION: This exam was performed according to the departmental dose-optimization program which includes automated exposure control, adjustment of the mA and/or kV according to patient size and/or use of iterative reconstruction technique. COMPARISON:  03/02/2022 FINDINGS: Bones/Joint/Cartilage Acute intra-articular left femoral neck fracture with transcervical and subcapital components and mild apex anterior angulation. No other regional fracture identified. Currently no significant joint effusion. Mild spurring of the left SI joint anteriorly. Ligaments Suboptimally assessed by CT. Muscles and Tendons Unremarkable Soft tissues Aortoiliac atherosclerotic vascular calcification. IMPRESSION: 1. Acute intra-articular left femoral neck  fracture with transcervical and subcapital components and mild apex anterior angulation. 2. Aortic atherosclerosis. Aortic Atherosclerosis (ICD10-I70.0). Electronically Signed   By: Van Clines M.D.   On: 03/02/2022 16:09   CT HEAD WO CONTRAST  Result Date: 03/02/2022 CLINICAL DATA:  Head trauma.  Fall. EXAM: CT HEAD WITHOUT CONTRAST CT MAXILLOFACIAL WITHOUT CONTRAST CT CERVICAL SPINE WITHOUT CONTRAST  TECHNIQUE: Multidetector CT imaging of the head, cervical spine, and maxillofacial structures were performed using the standard protocol without intravenous contrast. Multiplanar CT image reconstructions of the cervical spine and maxillofacial structures were also generated. RADIATION DOSE REDUCTION: This exam was performed according to the departmental dose-optimization program which includes automated exposure control, adjustment of the mA and/or kV according to patient size and/or use of iterative reconstruction technique. COMPARISON:  CT head 02/17/2021 FINDINGS: CT HEAD FINDINGS Brain: Mild atrophy. Moderate white matter hypodensity diffusely with progression since 2022. Negative for acute infarct, hemorrhage, mass Vascular: Negative for hyperdense vessel Skull: Negative Other: None CT MAXILLOFACIAL FINDINGS Osseous: Fracture nasal bone with mild displacement. Nasal septum displaced to the right but without fracture. No other facial fracture. Negative mandible. Orbits: Negative for orbital fracture.  No orbital mass or edema. Sinuses: Mucosal edema in the paranasal sinuses. No air-fluid level. Mastoid and middle ear clear bilaterally Soft tissues: Mild soft tissue swelling of the nose. CT CERVICAL SPINE FINDINGS Alignment: Normal Skull base and vertebrae: Negative for fracture Soft tissues and spinal canal: Negative for soft tissue mass or edema. Disc levels: Mild disc degeneration and mild spurring throughout the cervical spine. No significant spinal stenosis Upper chest: Lung apices clear bilaterally  Other: None IMPRESSION: 1. No acute intracranial abnormality. Atrophy and chronic microvascular ischemic change in the white matter. 2. Fracture of the nasal bone with mild displacement. No other facial fracture. 3. Negative for cervical spine fracture. Electronically Signed   By: Franchot Gallo M.D.   On: 03/02/2022 15:19   CT CERVICAL SPINE WO CONTRAST  Result Date: 03/02/2022 CLINICAL DATA:  Head trauma.  Fall. EXAM: CT HEAD WITHOUT CONTRAST CT MAXILLOFACIAL WITHOUT CONTRAST CT CERVICAL SPINE WITHOUT CONTRAST TECHNIQUE: Multidetector CT imaging of the head, cervical spine, and maxillofacial structures were performed using the standard protocol without intravenous contrast. Multiplanar CT image reconstructions of the cervical spine and maxillofacial structures were also generated. RADIATION DOSE REDUCTION: This exam was performed according to the departmental dose-optimization program which includes automated exposure control, adjustment of the mA and/or kV according to patient size and/or use of iterative reconstruction technique. COMPARISON:  CT head 02/17/2021 FINDINGS: CT HEAD FINDINGS Brain: Mild atrophy. Moderate white matter hypodensity diffusely with progression since 2022. Negative for acute infarct, hemorrhage, mass Vascular: Negative for hyperdense vessel Skull: Negative Other: None CT MAXILLOFACIAL FINDINGS Osseous: Fracture nasal bone with mild displacement. Nasal septum displaced to the right but without fracture. No other facial fracture. Negative mandible. Orbits: Negative for orbital fracture.  No orbital mass or edema. Sinuses: Mucosal edema in the paranasal sinuses. No air-fluid level. Mastoid and middle ear clear bilaterally Soft tissues: Mild soft tissue swelling of the nose. CT CERVICAL SPINE FINDINGS Alignment: Normal Skull base and vertebrae: Negative for fracture Soft tissues and spinal canal: Negative for soft tissue mass or edema. Disc levels: Mild disc degeneration and mild  spurring throughout the cervical spine. No significant spinal stenosis Upper chest: Lung apices clear bilaterally Other: None IMPRESSION: 1. No acute intracranial abnormality. Atrophy and chronic microvascular ischemic change in the white matter. 2. Fracture of the nasal bone with mild displacement. No other facial fracture. 3. Negative for cervical spine fracture. Electronically Signed   By: Franchot Gallo M.D.   On: 03/02/2022 15:19   CT Maxillofacial Wo Contrast  Result Date: 03/02/2022 CLINICAL DATA:  Head trauma.  Fall. EXAM: CT HEAD WITHOUT CONTRAST CT MAXILLOFACIAL WITHOUT CONTRAST CT CERVICAL SPINE WITHOUT CONTRAST TECHNIQUE: Multidetector CT imaging of the head,  cervical spine, and maxillofacial structures were performed using the standard protocol without intravenous contrast. Multiplanar CT image reconstructions of the cervical spine and maxillofacial structures were also generated. RADIATION DOSE REDUCTION: This exam was performed according to the departmental dose-optimization program which includes automated exposure control, adjustment of the mA and/or kV according to patient size and/or use of iterative reconstruction technique. COMPARISON:  CT head 02/17/2021 FINDINGS: CT HEAD FINDINGS Brain: Mild atrophy. Moderate white matter hypodensity diffusely with progression since 2022. Negative for acute infarct, hemorrhage, mass Vascular: Negative for hyperdense vessel Skull: Negative Other: None CT MAXILLOFACIAL FINDINGS Osseous: Fracture nasal bone with mild displacement. Nasal septum displaced to the right but without fracture. No other facial fracture. Negative mandible. Orbits: Negative for orbital fracture.  No orbital mass or edema. Sinuses: Mucosal edema in the paranasal sinuses. No air-fluid level. Mastoid and middle ear clear bilaterally Soft tissues: Mild soft tissue swelling of the nose. CT CERVICAL SPINE FINDINGS Alignment: Normal Skull base and vertebrae: Negative for fracture Soft  tissues and spinal canal: Negative for soft tissue mass or edema. Disc levels: Mild disc degeneration and mild spurring throughout the cervical spine. No significant spinal stenosis Upper chest: Lung apices clear bilaterally Other: None IMPRESSION: 1. No acute intracranial abnormality. Atrophy and chronic microvascular ischemic change in the white matter. 2. Fracture of the nasal bone with mild displacement. No other facial fracture. 3. Negative for cervical spine fracture. Electronically Signed   By: Franchot Gallo M.D.   On: 03/02/2022 15:19   DG Hip Unilat With Pelvis 2-3 Views Left  Result Date: 03/02/2022 CLINICAL DATA:  Fall. EXAM: DG HIP (WITH OR WITHOUT PELVIS) 2-3V LEFT COMPARISON:  None Available. FINDINGS: Possible subcapital nondisplaced fracture left femoral neck. 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 Recommend CT. Electronically Signed   By: Franchot Gallo M.D.   On: 03/02/2022 15:12   DG Chest Port 1 View  Result Date: 03/02/2022 CLINICAL DATA:  Fall EXAM: PORTABLE CHEST 1 VIEW COMPARISON:  02/17/2021. FINDINGS: Cardiac silhouette is enlarged, even for an AP view. No pneumonia or pulmonary edema. No pneumothorax or pleural effusion. Old healed posterior right-sided fifth rib. Median sternotomy wires and prior CABG. IMPRESSION: Enlarged cardiac silhouette.  Lungs are clear. Electronically Signed   By: Sammie Bench M.D.   On: 03/02/2022 15:11     X-rays independently reviewed show a displaced subcapital left femoral neck fracture.  Review of Systems  All other systems reviewed and are negative.  Blood pressure 135/71, pulse 73, temperature 97.6 F (36.4 C), temperature source Oral, resp. rate 16, SpO2 95 %. Physical Exam Vitals reviewed.  Constitutional:      Appearance: Normal appearance. He is normal weight.  HENT:     Head: Normocephalic and atraumatic.  Eyes:      Extraocular Movements: Extraocular movements intact.     Pupils: Pupils are equal, round, and reactive to light.  Cardiovascular:     Rate and Rhythm: Normal rate.  Pulmonary:     Effort: Pulmonary effort is normal.     Breath sounds: Normal breath sounds.  Abdominal:     Palpations: Abdomen is soft.  Musculoskeletal:     Cervical back: Normal range of motion and neck supple.     Right hip: Tenderness and bony tenderness present. Decreased range of motion. Decreased strength.  Neurological:     Mental Status: He is alert and oriented to person, place, and time.  Psychiatric:        Behavior: Behavior normal.   His left lower extremity is slightly shortened and externally rotated.  Assessment/Plan: Left hip displaced subcapital femoral neck fracture  Just as we did for his right hip last year, our plan is to proceed to surgery later tomorrow afternoon for a left total hip arthroplasty.  Given the displaced nature of his fracture this is warranted for him.  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  The risk and benefits of surgery been explained in detail.  Obviously there is a high risk of bleeding given him being on Plavix.  However we cannot delay the surgery for 7 days to get the Plavix out of his system.  This was the same as last year while we replaced his right hip.  He can eat for now and will be n.p.o. after midnight in anticipation of surgery tomorrow afternoon on his left hip.  Mcarthur Rossetti 03/02/2022, 5:46 PM

## 2022-03-02 NOTE — Telephone Encounter (Signed)
Pt's wife, Breckan Cafiero (on last DPR) pt is in the hospital due to a fall today, injured left leg, nose. Would like a call from the nurse to ask regarding if ask him not to do something does his mind comprehend why not to do it. Pt get agitated.

## 2022-03-03 ENCOUNTER — Encounter (HOSPITAL_COMMUNITY): Payer: Self-pay | Admitting: Internal Medicine

## 2022-03-03 ENCOUNTER — Inpatient Hospital Stay (HOSPITAL_COMMUNITY): Payer: Medicare Other

## 2022-03-03 ENCOUNTER — Encounter (HOSPITAL_COMMUNITY)
Admission: EM | Disposition: A | Discharge status: Rehabilitation Facility | Payer: Self-pay | Source: Home / Self Care | Attending: Internal Medicine

## 2022-03-03 ENCOUNTER — Inpatient Hospital Stay (HOSPITAL_COMMUNITY): Payer: Medicare Other | Admitting: Anesthesiology

## 2022-03-03 DIAGNOSIS — E663 Overweight: Secondary | ICD-10-CM | POA: Diagnosis present

## 2022-03-03 DIAGNOSIS — I10 Essential (primary) hypertension: Secondary | ICD-10-CM | POA: Diagnosis present

## 2022-03-03 DIAGNOSIS — E785 Hyperlipidemia, unspecified: Secondary | ICD-10-CM | POA: Diagnosis present

## 2022-03-03 DIAGNOSIS — Z7902 Long term (current) use of antithrombotics/antiplatelets: Secondary | ICD-10-CM | POA: Diagnosis not present

## 2022-03-03 DIAGNOSIS — Z7984 Long term (current) use of oral hypoglycemic drugs: Secondary | ICD-10-CM | POA: Diagnosis not present

## 2022-03-03 DIAGNOSIS — Z79899 Other long term (current) drug therapy: Secondary | ICD-10-CM | POA: Diagnosis not present

## 2022-03-03 DIAGNOSIS — S72002D Fracture of unspecified part of neck of left femur, subsequent encounter for closed fracture with routine healing: Secondary | ICD-10-CM | POA: Diagnosis present

## 2022-03-03 DIAGNOSIS — G4700 Insomnia, unspecified: Secondary | ICD-10-CM | POA: Diagnosis present

## 2022-03-03 DIAGNOSIS — R0600 Dyspnea, unspecified: Secondary | ICD-10-CM | POA: Diagnosis not present

## 2022-03-03 DIAGNOSIS — Z1152 Encounter for screening for COVID-19: Secondary | ICD-10-CM | POA: Diagnosis not present

## 2022-03-03 DIAGNOSIS — R2689 Other abnormalities of gait and mobility: Secondary | ICD-10-CM | POA: Diagnosis present

## 2022-03-03 DIAGNOSIS — E119 Type 2 diabetes mellitus without complications: Secondary | ICD-10-CM

## 2022-03-03 DIAGNOSIS — S022XXA Fracture of nasal bones, initial encounter for closed fracture: Secondary | ICD-10-CM | POA: Diagnosis present

## 2022-03-03 DIAGNOSIS — Z96642 Presence of left artificial hip joint: Secondary | ICD-10-CM | POA: Diagnosis not present

## 2022-03-03 DIAGNOSIS — Z951 Presence of aortocoronary bypass graft: Secondary | ICD-10-CM | POA: Diagnosis not present

## 2022-03-03 DIAGNOSIS — R296 Repeated falls: Secondary | ICD-10-CM | POA: Diagnosis present

## 2022-03-03 DIAGNOSIS — S72002G Fracture of unspecified part of neck of left femur, subsequent encounter for closed fracture with delayed healing: Secondary | ICD-10-CM | POA: Diagnosis not present

## 2022-03-03 DIAGNOSIS — Z96643 Presence of artificial hip joint, bilateral: Secondary | ICD-10-CM | POA: Diagnosis present

## 2022-03-03 DIAGNOSIS — Y9301 Activity, walking, marching and hiking: Secondary | ICD-10-CM | POA: Diagnosis present

## 2022-03-03 DIAGNOSIS — G214 Vascular parkinsonism: Secondary | ICD-10-CM | POA: Diagnosis present

## 2022-03-03 DIAGNOSIS — S72142A Displaced intertrochanteric fracture of left femur, initial encounter for closed fracture: Secondary | ICD-10-CM | POA: Diagnosis not present

## 2022-03-03 DIAGNOSIS — S72012A Unspecified intracapsular fracture of left femur, initial encounter for closed fracture: Secondary | ICD-10-CM | POA: Diagnosis present

## 2022-03-03 DIAGNOSIS — Z471 Aftercare following joint replacement surgery: Secondary | ICD-10-CM | POA: Diagnosis not present

## 2022-03-03 DIAGNOSIS — Z96641 Presence of right artificial hip joint: Secondary | ICD-10-CM | POA: Diagnosis present

## 2022-03-03 DIAGNOSIS — Z8673 Personal history of transient ischemic attack (TIA), and cerebral infarction without residual deficits: Secondary | ICD-10-CM | POA: Diagnosis not present

## 2022-03-03 DIAGNOSIS — I2510 Atherosclerotic heart disease of native coronary artery without angina pectoris: Secondary | ICD-10-CM | POA: Diagnosis present

## 2022-03-03 DIAGNOSIS — E876 Hypokalemia: Secondary | ICD-10-CM | POA: Diagnosis present

## 2022-03-03 DIAGNOSIS — Z6825 Body mass index (BMI) 25.0-25.9, adult: Secondary | ICD-10-CM | POA: Diagnosis not present

## 2022-03-03 DIAGNOSIS — S72002A Fracture of unspecified part of neck of left femur, initial encounter for closed fracture: Secondary | ICD-10-CM | POA: Diagnosis not present

## 2022-03-03 DIAGNOSIS — R059 Cough, unspecified: Secondary | ICD-10-CM | POA: Diagnosis not present

## 2022-03-03 DIAGNOSIS — Z7983 Long term (current) use of bisphosphonates: Secondary | ICD-10-CM | POA: Diagnosis not present

## 2022-03-03 DIAGNOSIS — I4510 Unspecified right bundle-branch block: Secondary | ICD-10-CM | POA: Diagnosis present

## 2022-03-03 DIAGNOSIS — S72001A Fracture of unspecified part of neck of right femur, initial encounter for closed fracture: Secondary | ICD-10-CM | POA: Diagnosis present

## 2022-03-03 DIAGNOSIS — D62 Acute posthemorrhagic anemia: Secondary | ICD-10-CM | POA: Diagnosis present

## 2022-03-03 DIAGNOSIS — R269 Unspecified abnormalities of gait and mobility: Secondary | ICD-10-CM | POA: Diagnosis present

## 2022-03-03 DIAGNOSIS — W1830XA Fall on same level, unspecified, initial encounter: Secondary | ICD-10-CM | POA: Diagnosis present

## 2022-03-03 DIAGNOSIS — Z8249 Family history of ischemic heart disease and other diseases of the circulatory system: Secondary | ICD-10-CM | POA: Diagnosis not present

## 2022-03-03 DIAGNOSIS — Z9049 Acquired absence of other specified parts of digestive tract: Secondary | ICD-10-CM | POA: Diagnosis not present

## 2022-03-03 HISTORY — PX: TOTAL HIP ARTHROPLASTY: SHX124

## 2022-03-03 LAB — BASIC METABOLIC PANEL
Anion gap: 9 (ref 5–15)
BUN: 27 mg/dL — ABNORMAL HIGH (ref 8–23)
CO2: 23 mmol/L (ref 22–32)
Calcium: 8.4 mg/dL — ABNORMAL LOW (ref 8.9–10.3)
Chloride: 105 mmol/L (ref 98–111)
Creatinine, Ser: 1.26 mg/dL — ABNORMAL HIGH (ref 0.61–1.24)
GFR, Estimated: 59 mL/min — ABNORMAL LOW (ref >60)
Glucose, Bld: 116 mg/dL — ABNORMAL HIGH (ref 70–99)
Potassium: 4.3 mmol/L (ref 3.5–5.1)
Sodium: 137 mmol/L (ref 135–145)

## 2022-03-03 LAB — CBC
HCT: 39.3 % (ref 39.0–52.0)
Hemoglobin: 13 g/dL (ref 13.0–17.0)
MCH: 32.1 pg (ref 26.0–34.0)
MCHC: 33.1 g/dL (ref 30.0–36.0)
MCV: 97 fL (ref 80.0–100.0)
Platelets: 158 10*3/uL (ref 150–400)
RBC: 4.05 MIL/uL — ABNORMAL LOW (ref 4.22–5.81)
RDW: 13.6 % (ref 11.5–15.5)
WBC: 8 10*3/uL (ref 4.0–10.5)
nRBC: 0 % (ref 0.0–0.2)

## 2022-03-03 LAB — SURGICAL PCR SCREEN
MRSA, PCR: NEGATIVE
Staphylococcus aureus: NEGATIVE

## 2022-03-03 LAB — GLUCOSE, CAPILLARY
Glucose-Capillary: 107 mg/dL — ABNORMAL HIGH (ref 70–99)
Glucose-Capillary: 104 mg/dL — ABNORMAL HIGH (ref 70–99)
Glucose-Capillary: 135 mg/dL — ABNORMAL HIGH (ref 70–99)
Glucose-Capillary: 232 mg/dL — ABNORMAL HIGH (ref 70–99)

## 2022-03-03 SURGERY — ARTHROPLASTY, HIP, TOTAL, ANTERIOR APPROACH
Anesthesia: General | Site: Hip | Laterality: Left

## 2022-03-03 MED ORDER — ONDANSETRON HCL 4 MG/2ML IJ SOLN: 4.0000 mg | Freq: Once | INTRAMUSCULAR | Status: DC | PRN

## 2022-03-03 MED ORDER — ESMOLOL HCL 100 MG/10ML IV SOLN
INTRAVENOUS | Status: DC | PRN
  Administered 2022-03-03: 10 mg via INTRAVENOUS

## 2022-03-03 MED ORDER — OXYCODONE HCL 5 MG PO TABS: 5.0000 mg | ORAL_TABLET | Freq: Once | ORAL | Status: AC | PRN

## 2022-03-03 MED ORDER — OXYCODONE HCL 5 MG PO TABS
ORAL_TABLET | ORAL | Status: AC
  Administered 2022-03-03: 5 mg via ORAL
  Filled 2022-03-03: qty 1

## 2022-03-03 MED ORDER — EMPAGLIFLOZIN-METFORMIN HCL ER 25-1000 MG PO TB24: 1.0000 | ORAL_TABLET | Freq: Every day | ORAL | Status: DC

## 2022-03-03 MED ORDER — DIPHENHYDRAMINE HCL 12.5 MG/5ML PO ELIX: 12.5000 mg | ORAL_SOLUTION | ORAL | Status: DC | PRN

## 2022-03-03 MED ORDER — PROPOFOL 10 MG/ML IV BOLUS
INTRAVENOUS | Status: DC | PRN
  Administered 2022-03-03: 140 mg via INTRAVENOUS

## 2022-03-03 MED ORDER — PIOGLITAZONE HCL 15 MG PO TABS
15.0000 mg | ORAL_TABLET | Freq: Every day | ORAL | Status: DC
  Administered 2022-03-04 – 2022-03-08 (×5): 15 mg via ORAL
  Filled 2022-03-03 (×5): qty 1

## 2022-03-03 MED ORDER — HYDROMORPHONE HCL 1 MG/ML IJ SOLN: 0.5000 mg | INTRAMUSCULAR | Status: DC | PRN

## 2022-03-03 MED ORDER — DEXMEDETOMIDINE HCL IN NACL 80 MCG/20ML IV SOLN
INTRAVENOUS | Status: DC | PRN
  Administered 2022-03-03 (×2): 8 ug via BUCCAL

## 2022-03-03 MED ORDER — METOCLOPRAMIDE HCL 5 MG PO TABS: 5.0000 mg | ORAL_TABLET | Freq: Three times a day (TID) | ORAL | Status: DC | PRN

## 2022-03-03 MED ORDER — FENTANYL CITRATE (PF) 250 MCG/5ML IJ SOLN
INTRAMUSCULAR | Status: DC | PRN
  Administered 2022-03-03 (×3): 50 ug via INTRAVENOUS
  Administered 2022-03-03: 100 ug via INTRAVENOUS

## 2022-03-03 MED ORDER — OXYCODONE HCL 5 MG PO TABS
10.0000 mg | ORAL_TABLET | ORAL | Status: DC | PRN
  Administered 2022-03-03 – 2022-03-04 (×2): 10 mg via ORAL

## 2022-03-03 MED ORDER — SODIUM CHLORIDE 0.9 % IR SOLN
Status: DC | PRN
  Administered 2022-03-03: 1000 mL

## 2022-03-03 MED ORDER — DEXAMETHASONE SODIUM PHOSPHATE 10 MG/ML IJ SOLN
INTRAMUSCULAR | Status: AC
  Filled 2022-03-03: qty 1

## 2022-03-03 MED ORDER — HYDRALAZINE HCL 20 MG/ML IJ SOLN
INTRAMUSCULAR | Status: AC
  Filled 2022-03-03: qty 1

## 2022-03-03 MED ORDER — CHLORHEXIDINE GLUCONATE 0.12 % MT SOLN
15.0000 mL | Freq: Once | OROMUCOSAL | Status: AC
  Administered 2022-03-03: 15 mL via OROMUCOSAL

## 2022-03-03 MED ORDER — LACTATED RINGERS IV SOLN: INTRAVENOUS | Status: DC

## 2022-03-03 MED ORDER — HYDRALAZINE HCL 20 MG/ML IJ SOLN
INTRAMUSCULAR | Status: DC | PRN
  Administered 2022-03-03: 5 mg via INTRAVENOUS

## 2022-03-03 MED ORDER — ESMOLOL HCL 100 MG/10ML IV SOLN
INTRAVENOUS | Status: AC
  Filled 2022-03-03: qty 10

## 2022-03-03 MED ORDER — HYDROMORPHONE HCL 1 MG/ML IJ SOLN: 0.2500 mg | INTRAMUSCULAR | Status: DC | PRN

## 2022-03-03 MED ORDER — HYDROMORPHONE HCL 1 MG/ML IJ SOLN
INTRAMUSCULAR | Status: AC
  Administered 2022-03-03: 0.5 mg via INTRAVENOUS
  Filled 2022-03-03: qty 1

## 2022-03-03 MED ORDER — OXYCODONE HCL 5 MG/5ML PO SOLN: 5.0000 mg | Freq: Once | ORAL | Status: AC | PRN

## 2022-03-03 MED ORDER — METOCLOPRAMIDE HCL 5 MG/ML IJ SOLN: 5.0000 mg | Freq: Three times a day (TID) | INTRAMUSCULAR | Status: DC | PRN

## 2022-03-03 MED ORDER — LIDOCAINE HCL (PF) 2 % IJ SOLN
INTRAMUSCULAR | Status: AC
  Filled 2022-03-03: qty 5

## 2022-03-03 MED ORDER — CEFAZOLIN SODIUM-DEXTROSE 1-4 GM/50ML-% IV SOLN
1.0000 g | Freq: Four times a day (QID) | INTRAVENOUS | Status: AC
  Administered 2022-03-03 – 2022-03-04 (×2): 1 g via INTRAVENOUS
  Filled 2022-03-03 (×2): qty 50

## 2022-03-03 MED ORDER — CEFAZOLIN SODIUM-DEXTROSE 2-4 GM/100ML-% IV SOLN
INTRAVENOUS | Status: AC
  Filled 2022-03-03: qty 100

## 2022-03-03 MED ORDER — 0.9 % SODIUM CHLORIDE (POUR BTL) OPTIME
TOPICAL | Status: DC | PRN
  Administered 2022-03-03: 1000 mL

## 2022-03-03 MED ORDER — ONDANSETRON HCL 4 MG/2ML IJ SOLN: 4.0000 mg | Freq: Four times a day (QID) | INTRAMUSCULAR | Status: DC | PRN

## 2022-03-03 MED ORDER — CEFAZOLIN SODIUM-DEXTROSE 2-4 GM/100ML-% IV SOLN
2.0000 g | Freq: Once | INTRAVENOUS | Status: AC
  Administered 2022-03-03: 2 g via INTRAVENOUS

## 2022-03-03 MED ORDER — PHENOL 1.4 % MT LIQD: 1.0000 | OROMUCOSAL | Status: DC | PRN

## 2022-03-03 MED ORDER — TRANEXAMIC ACID-NACL 1000-0.7 MG/100ML-% IV SOLN
1000.0000 mg | INTRAVENOUS | Status: AC
  Administered 2022-03-03: 1000 mg via INTRAVENOUS

## 2022-03-03 MED ORDER — METFORMIN HCL ER 500 MG PO TB24
1000.0000 mg | ORAL_TABLET | Freq: Every day | ORAL | Status: DC
  Administered 2022-03-04 – 2022-03-08 (×5): 1000 mg via ORAL
  Filled 2022-03-03 (×5): qty 2

## 2022-03-03 MED ORDER — ATORVASTATIN CALCIUM 40 MG PO TABS
80.0000 mg | ORAL_TABLET | Freq: Every day | ORAL | Status: DC
  Administered 2022-03-03 – 2022-03-07 (×5): 80 mg via ORAL
  Filled 2022-03-03 (×6): qty 2

## 2022-03-03 MED ORDER — MENTHOL 3 MG MT LOZG: 1.0000 | LOZENGE | OROMUCOSAL | Status: DC | PRN

## 2022-03-03 MED ORDER — PANTOPRAZOLE SODIUM 40 MG PO TBEC
40.0000 mg | DELAYED_RELEASE_TABLET | Freq: Every day | ORAL | Status: DC
  Administered 2022-03-03 – 2022-03-08 (×5): 40 mg via ORAL
  Filled 2022-03-03 (×7): qty 1

## 2022-03-03 MED ORDER — LIDOCAINE 2% (20 MG/ML) 5 ML SYRINGE
INTRAMUSCULAR | Status: DC | PRN
  Administered 2022-03-03: 100 mg via INTRAVENOUS

## 2022-03-03 MED ORDER — DEXAMETHASONE SODIUM PHOSPHATE 10 MG/ML IJ SOLN
INTRAMUSCULAR | Status: DC | PRN
  Administered 2022-03-03: 4 mg via INTRAVENOUS

## 2022-03-03 MED ORDER — PROPOFOL 10 MG/ML IV BOLUS
INTRAVENOUS | Status: AC
  Filled 2022-03-03: qty 20

## 2022-03-03 MED ORDER — CLOPIDOGREL BISULFATE 75 MG PO TABS
75.0000 mg | ORAL_TABLET | Freq: Every day | ORAL | Status: DC
  Administered 2022-03-04 – 2022-03-08 (×5): 75 mg via ORAL
  Filled 2022-03-03 (×5): qty 1

## 2022-03-03 MED ORDER — EMPAGLIFLOZIN 25 MG PO TABS
25.0000 mg | ORAL_TABLET | Freq: Every day | ORAL | Status: DC
  Administered 2022-03-04 – 2022-03-08 (×5): 25 mg via ORAL
  Filled 2022-03-03 (×5): qty 1

## 2022-03-03 MED ORDER — OXYCODONE HCL 5 MG PO TABS
5.0000 mg | ORAL_TABLET | ORAL | Status: DC | PRN
  Administered 2022-03-04: 10 mg via ORAL
  Filled 2022-03-03 (×4): qty 2

## 2022-03-03 MED ORDER — METHOCARBAMOL 500 MG IVPB - SIMPLE MED: 500.0000 mg | Freq: Four times a day (QID) | INTRAVENOUS | Status: DC | PRN

## 2022-03-03 MED ORDER — ONDANSETRON HCL 4 MG PO TABS: 4.0000 mg | ORAL_TABLET | Freq: Four times a day (QID) | ORAL | Status: DC | PRN

## 2022-03-03 MED ORDER — ROCURONIUM BROMIDE 10 MG/ML (PF) SYRINGE
PREFILLED_SYRINGE | INTRAVENOUS | Status: DC | PRN
  Administered 2022-03-03: 50 mg via INTRAVENOUS

## 2022-03-03 MED ORDER — DOCUSATE SODIUM 100 MG PO CAPS
100.0000 mg | ORAL_CAPSULE | Freq: Two times a day (BID) | ORAL | Status: DC
  Administered 2022-03-03 – 2022-03-07 (×7): 100 mg via ORAL
  Filled 2022-03-03 (×10): qty 1

## 2022-03-03 MED ORDER — ONDANSETRON HCL 4 MG/2ML IJ SOLN
INTRAMUSCULAR | Status: AC
  Filled 2022-03-03: qty 2

## 2022-03-03 MED ORDER — ONDANSETRON HCL 4 MG/2ML IJ SOLN
INTRAMUSCULAR | Status: DC | PRN
  Administered 2022-03-03: 4 mg via INTRAVENOUS

## 2022-03-03 MED ORDER — FENTANYL CITRATE (PF) 250 MCG/5ML IJ SOLN
INTRAMUSCULAR | Status: AC
  Filled 2022-03-03: qty 5

## 2022-03-03 MED ORDER — STERILE WATER FOR IRRIGATION IR SOLN
Status: DC | PRN
  Administered 2022-03-03: 2000 mL

## 2022-03-03 MED ORDER — DEXMEDETOMIDINE HCL IN NACL 80 MCG/20ML IV SOLN
INTRAVENOUS | Status: AC
  Filled 2022-03-03: qty 20

## 2022-03-03 MED ORDER — METHOCARBAMOL 500 MG IVPB - SIMPLE MED
INTRAVENOUS | Status: AC
  Administered 2022-03-03: 500 mg via INTRAVENOUS
  Filled 2022-03-03: qty 55

## 2022-03-03 MED ORDER — SODIUM CHLORIDE 0.9 % IV SOLN: INTRAVENOUS | Status: DC

## 2022-03-03 MED ORDER — ORAL CARE MOUTH RINSE: 15.0000 mL | Freq: Once | OROMUCOSAL | Status: AC

## 2022-03-03 MED ORDER — METHOCARBAMOL 500 MG PO TABS
500.0000 mg | ORAL_TABLET | Freq: Four times a day (QID) | ORAL | Status: DC | PRN
  Administered 2022-03-08: 500 mg via ORAL
  Filled 2022-03-03: qty 1

## 2022-03-03 MED ORDER — SUGAMMADEX SODIUM 200 MG/2ML IV SOLN
INTRAVENOUS | Status: DC | PRN
  Administered 2022-03-03: 160 mg via INTRAVENOUS

## 2022-03-03 MED ORDER — ACETAMINOPHEN 325 MG PO TABS
325.0000 mg | ORAL_TABLET | Freq: Four times a day (QID) | ORAL | Status: DC | PRN
  Administered 2022-03-07: 650 mg via ORAL
  Filled 2022-03-03 (×2): qty 2

## 2022-03-03 SURGICAL SUPPLY — 42 items
APL SKNCLS STERI-STRIP NONHPOA (GAUZE/BANDAGES/DRESSINGS)
ARTICULEZE HEAD (Hips) ×1 IMPLANT
BAG COUNTER SPONGE SURGICOUNT (BAG) ×1 IMPLANT
BAG SPEC THK2 15X12 ZIP CLS (MISCELLANEOUS)
BAG SPNG CNTER NS LX DISP (BAG) ×1
BAG ZIPLOCK 12X15 (MISCELLANEOUS) IMPLANT
BENZOIN TINCTURE PRP APPL 2/3 (GAUZE/BANDAGES/DRESSINGS) IMPLANT
BLADE SAW SGTL 18X1.27X75 (BLADE) ×1 IMPLANT
COVER PERINEAL POST (MISCELLANEOUS) ×1 IMPLANT
COVER SURGICAL LIGHT HANDLE (MISCELLANEOUS) ×1 IMPLANT
CUP SECTOR GRIPTON 58MM (Orthopedic Implant) IMPLANT
DRAPE FOOT SWITCH (DRAPES) ×1 IMPLANT
DRAPE STERI IOBAN 125X83 (DRAPES) ×1 IMPLANT
DRAPE U-SHAPE 47X51 STRL (DRAPES) ×2 IMPLANT
DRSG AQUACEL AG ADV 3.5X10 (GAUZE/BANDAGES/DRESSINGS) ×1 IMPLANT
DURAPREP 26ML APPLICATOR (WOUND CARE) ×1 IMPLANT
ELECT REM PT RETURN 15FT ADLT (MISCELLANEOUS) ×1 IMPLANT
GAUZE XEROFORM 1X8 LF (GAUZE/BANDAGES/DRESSINGS) ×1 IMPLANT
GLOVE BIO SURGEON STRL SZ7.5 (GLOVE) ×1 IMPLANT
GLOVE BIOGEL PI IND STRL 8 (GLOVE) ×2 IMPLANT
GLOVE ECLIPSE 8.0 STRL XLNG CF (GLOVE) ×1 IMPLANT
GOWN STRL REUS W/ TWL XL LVL3 (GOWN DISPOSABLE) ×2 IMPLANT
GOWN STRL REUS W/TWL XL LVL3 (GOWN DISPOSABLE) ×2
HANDPIECE INTERPULSE COAX TIP (DISPOSABLE) ×1
HEAD ARTICULEZE (Hips) IMPLANT
HOLDER FOLEY CATH W/STRAP (MISCELLANEOUS) ×1 IMPLANT
KIT TURNOVER KIT A (KITS) IMPLANT
LINER NEUTRAL 36X58 PLUS4 IMPLANT
PACK ANTERIOR HIP CUSTOM (KITS) ×1 IMPLANT
SET HNDPC FAN SPRY TIP SCT (DISPOSABLE) ×1 IMPLANT
STAPLER VISISTAT 35W (STAPLE) IMPLANT
STEM FEM ACTIS HIGH SZ7 (Stem) IMPLANT
STRIP CLOSURE SKIN 1/2X4 (GAUZE/BANDAGES/DRESSINGS) IMPLANT
SUT ETHIBOND NAB CT1 #1 30IN (SUTURE) ×1 IMPLANT
SUT ETHILON 2 0 PS N (SUTURE) IMPLANT
SUT MNCRL AB 4-0 PS2 18 (SUTURE) IMPLANT
SUT VIC AB 0 CT1 36 (SUTURE) ×1 IMPLANT
SUT VIC AB 1 CT1 36 (SUTURE) ×1 IMPLANT
SUT VIC AB 2-0 CT1 27 (SUTURE) ×2
SUT VIC AB 2-0 CT1 TAPERPNT 27 (SUTURE) ×2 IMPLANT
TRAY FOLEY MTR SLVR 16FR STAT (SET/KITS/TRAYS/PACK) IMPLANT
YANKAUER SUCT BULB TIP NO VENT (SUCTIONS) ×1 IMPLANT

## 2022-03-03 NOTE — Transfer of Care (Signed)
Immediate Anesthesia Transfer of Care Note  Patient: Beryl Balz.  Procedure(s) Performed: TOTAL HIP ARTHROPLASTY ANTERIOR APPROACH (Left: Hip)  Patient Location: PACU  Anesthesia Type:General  Level of Consciousness: awake, oriented, and patient cooperative  Airway & Oxygen Therapy: Patient Spontanous Breathing and Patient connected to nasal cannula oxygen  Post-op Assessment: Report given to RN and Post -op Vital signs reviewed and stable  Post vital signs: Reviewed  Last Vitals:  Vitals Value Taken Time  BP 163/83 03/03/22 1600  Temp 36.7 C 03/03/22 1600  Pulse 92 03/03/22 1604  Resp 20 03/03/22 1604  SpO2 96 % 03/03/22 1604  Vitals shown include unvalidated device data.  Last Pain:  Vitals:   03/03/22 0935  TempSrc:   PainSc: 0-No pain      Patients Stated Pain Goal: 0 (15/[id number]/82 [id number])  Complications: No notable events documented.

## 2022-03-03 NOTE — Interval H&P Note (Signed)
History and Physical Interval Note: The patient understands that we are proceeding to surgery today for a left hip replacement to treat his acute displaced left hip femoral neck fracture.  Having had this performed a right hip after a fall last year breaking his hip, he is fully aware what the surgery involves and is aware of the risk and benefits of surgery.  We reviewed his chart today as well as his labs and vital signs.  Informed consent has been obtained and the operative hip has been marked.  03/03/2022 2:11 PM  [name].  has presented today for surgery, with the diagnosis of left hip femoral neck fracture.  The various methods of treatment have been discussed with the patient and family. After consideration of risks, benefits and other options for treatment, the patient has consented to  Procedure(s): TOTAL HIP ARTHROPLASTY ANTERIOR APPROACH (Left) as a surgical intervention.  The patient's history has been reviewed, patient examined, no change in status, stable for surgery.  I have reviewed the patient's chart and labs.  Questions were answered to the patient's satisfaction.     Mcarthur Rossetti

## 2022-03-03 NOTE — Progress Notes (Signed)
PROGRESS NOTE  Joel Gonzales.  DOB: [birth year]  PCP: Haywood Pao, MD YJE:[id number]  DOA: 03/02/2022  LOS: 0 days  Hospital Day: 2  Brief narrative: Joel Tomasello. is a 76 y.o. 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 Today, patient was brought into ED after an episode of fall. Patient was diagnosed with vascular parkinsonism 4 years ago at [facility].  He has trouble with his gait.  He sometimes has trouble stopping and tends to fall forward.  Today he was walking, fell forward hitting his face against a parked car and fell to the ground impacting his left hip.  He sustained a scrap to his nose and forehead and started hurting in the left hip and is brought to the ED. In the ED, hemodynamically stable, breathing on room air. Labs with WC count 11.3, hemoglobin 15.1, platelet 219, BUN/creatinine 29/1.19, glucose level elevated to 189. CT scan of left hip showed an acute intra-articular left femoral neck fracture with transcervical and subcapital components and mild apex anterior angulation. CT scan of head, cervical spine and maxillofacial were also obtained. No acute intracranial abnormality. Negative for cervical spine fracture. Atrophy and chronic microvascular ischemic change in the white matter. Fracture of the nasal bone with mild displacement. No other facial fracture.  ED physician discussed the case with Orthopedics Dr. [facility] service consulted for admission and management.  Subjective: Patient was seen and examined this morning.  Lying down in bed.  Not in distress. Tentative plan of ORIF today. Wife at bedside.  Assessment/Plan: Acute intra-articular left femoral neck fracture Secondary to fall from losing balance Imaging as above Orthopedics Dr. Ninfa Linden called from ED Tentative plan of surgical fixation today.  Remains n.p.o. today.  Plavix on  hold. Will order IV morphine/oral oxycodone as needed for pain control DVT prophylaxis with Lovenox subcu  Perioperative risk assessment Patient had CABG done several years ago and follows up with Dr. Gwenlyn Found.  No cardiac symptoms in several years. He had right hip replacement done last year at [facility].  No cardiac event.  He had postop confusion for 2 to 3 days which resolved eventually. Currently with no ischemic symptoms.  Will obtain EKG to look for Qtc. No other cardiac testing needed prior to procedure. At acceptable risk for planned procedure.  Patient and his wife understand and are agreeable to the procedure.  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  He has not started it yet.  Will wait till after the procedure to start it.  Nasal bone fracture Mild swelling noted.  No external injury.  Pain controlled.  No need of surgical intervention  Type 2 diabetes mellitus A1c 6.9 in 2022. PTA on Synjardy, Actos Hold oral meds.  Start on sliding scale insulin with Accu-Cheks Recent Labs  Lab 03/02/22 1952 03/03/22 0746 03/03/22 1158  GLUCAP 180* 107* 104*   CAD/CABG  HLD  PTA on metoprolol, Plavix, statin  Resume metoprolol.  Keep others on hold   Essential hypertension On Toprol 50 mg daily and ramipril 5 mg twice daily Continue Toprol.  Keep ramipril on hold. Continue monitor blood pressure.  Hydralazine IV as needed  Mobility: Needs PT eval after surgery  Goals of care   Code Status: Full Code   Diet:  Diet Order             Diet NPO time specified  Diet effective midnight                   Nutritional status:  Body mass index is 26.61 kg/m.       DVT prophylaxis: Lovenox subcu enoxaparin (LOVENOX) injection 40 mg Start: 03/03/22 2000   Antimicrobials: None Fluid: NS at 65 mill per hour Consultants:  Orthopedics Family Communication: Wife at bedside  Status is: Inpatient  Continue in-hospital care because: Pending ORIF today Level of care: Telemetry   Dispo: The patient is from: Home              Anticipated d/c is to: Pending clinical course              Patient currently is not medically stable to d/c.   Difficult to place patient No     Infusions:   sodium chloride 75 mL/hr at 03/03/22 0934   lactated ringers 10 mL/hr at 03/03/22 1418   [MAR Hold] methocarbamol (ROBAXIN) IV      Scheduled Meds:  [MAR Hold] enoxaparin (LOVENOX) injection  40 mg Subcutaneous Q24H   [MAR Hold] insulin aspart  0-5 Units Subcutaneous QHS   [MAR Hold] insulin aspart  0-9 Units Subcutaneous TID WC   [MAR Hold] metoprolol succinate  50 mg Oral Daily   [MAR Hold] pantoprazole (PROTONIX) IV  40 mg Intravenous Daily   [MAR Hold] senna  1 tablet Oral BID    PRN meds: 0.9 % irrigation (POUR BTL), [MAR Hold] acetaminophen **OR** [MAR Hold] acetaminophen, [MAR Hold] albuterol, [MAR Hold] bisacodyl, [MAR Hold] hydrALAZINE, [MAR Hold] methocarbamol (ROBAXIN) IV, [MAR Hold]  morphine injection, [MAR Hold] oxyCODONE, sodium chloride irrigation, sterile water for irrigation   Antimicrobials: Anti-infectives (From admission, onward)    Start     Dose/Rate Route Frequency Ordered Stop   03/03/22 1330  ceFAZolin (ANCEF) IVPB 2g/100 mL premix        2 g 200 mL/hr over 30 Minutes Intravenous  Once 03/03/22 1328 03/03/22 1435   03/03/22 1329  ceFAZolin (ANCEF) 2-4 GM/100ML-% IVPB       Note to Pharmacy: Mardelle Matte R: cabinet override      03/03/22 1329 03/03/22 1443       Objective: Vitals:   03/03/22 0354 03/03/22 0744  BP: 133/69 (!) 145/69  Pulse: 79 81  Resp: 16 16  Temp: 98.4 F (36.9 C) 98.2 F (36.8 C)  SpO2: 95% 95%    Intake/Output Summary (Last 24 hours) at 03/03/2022 1513 Last data filed at 03/03/2022 1509 Gross per 24 hour  Intake 666.98 ml  Output 710 ml  Net -43.02 ml    Filed Weights   03/02/22 1915  Weight: 89 kg   Weight change:  Body mass index is 26.61 kg/m.   Physical Exam: General exam: Pleasant, elderly Caucasian male.  Pain controlled. Skin: No rashes, lesions or ulcers. HEENT: Swollen nose because of nasal bone fracture, normocephalic, no obvious bleeding Lungs: Clear to auscultation bilaterally CVS: Regular rate and rhythm, no murmur.  Mid line scar in the chest from bypass surgery in the past GI/Abd soft, nontender, nondistended, bowel sound. CNS: Alert, awake, oriented x 3 Psychiatry: Mood appropriate Extremities: No pedal edema, no calf tenderness  Data Review: I have personally reviewed the laboratory data and studies available.  F/u labs ordered [employer] (From admission, onward)     Start  Ordered   03/04/22 0500  CBC with Differential/Platelet  Tomorrow morning,   R        03/03/22 1513   03/04/22 1017  Basic metabolic panel  Tomorrow morning,   R        03/03/22 1513   03/02/22 1939  Hemoglobin A1c  Add-on,   AD       Comments: To assess prior glycemic control    03/02/22 1938            Signed, Terrilee Croak, MD [facility] 03/03/2022

## 2022-03-03 NOTE — Anesthesia Preprocedure Evaluation (Addendum)
Anesthesia Evaluation  Patient identified by MRN, date of birth, ID band Patient awake    Reviewed: Allergy & Precautions, NPO status , Patient's Chart, lab work & pertinent test results, reviewed documented beta blocker date and time   Airway Mallampati: IV  TM Distance: >3 FB Neck ROM: Full    Dental  (+) Teeth Intact, Dental Advisory Given   Pulmonary neg pulmonary ROS   Pulmonary exam normal breath sounds clear to auscultation       Cardiovascular hypertension, Pt. on medications and Pt. on home beta blockers + CAD (plavix- LD:11/30) and + CABG (2001, no issues since then)  Normal cardiovascular exam Rhythm:Regular Rate:Normal     Neuro/Psych CVA, No Residual Symptoms  negative psych ROS   GI/Hepatic negative GI ROS, Neg liver ROS,,,  Endo/Other  diabetes, Well Controlled, Type 2, Oral Hypoglycemic Agents    Renal/GU Renal InsufficiencyRenal diseaseCr 1.26  negative genitourinary   Musculoskeletal negative musculoskeletal ROS (+)    Abdominal   Peds  Hematology negative hematology ROS (+) Hb 13, plt 158   Anesthesia Other Findings   Reproductive/Obstetrics negative OB ROS                              Anesthesia Physical Anesthesia Plan  ASA: 3  Anesthesia Plan: General   Post-op Pain Management: Tylenol PO (pre-op)*   Induction:   PONV Risk Score and Plan: 2 and Ondansetron, Dexamethasone and Treatment may vary due to age or medical condition  Airway Management Planned: Oral ETT  Additional Equipment: None  Intra-op Plan:   Post-operative Plan: Extubation in OR  Informed Consent: I have reviewed the patients History and Physical, chart, labs and discussed the procedure including the risks, benefits and alternatives for the proposed anesthesia with the patient or authorized representative who has indicated his/her understanding and acceptance.     Dental advisory  given  Plan Discussed with: CRNA  Anesthesia Plan Comments: (Has not been off plavix appropriately for spinal Had issues w/ emergence delirium last year after GA for other hip surgery- will load w/ precedex before emergence Last airway note: Ventilation: Mask ventilation with difficulty Laryngoscope Size: Mac and 3 Grade View: Grade II Tube type: Oral Tube size: 7.0 mm Number of attempts: 1 )        Anesthesia Quick Evaluation

## 2022-03-03 NOTE — Op Note (Signed)
Operative Note  Date of operation: 03/03/2022 Preoperative diagnosis: Left displaced subcapital femoral neck fracture Postoperative diagnosis: Same  Procedure: Left direct anterior total hip arthroplasty  Implants: DePuy sector GRIPTION acetabular pendant size 58, 36+4 polythene liner, size 7 Actis femoral component with high offset, 36+5 metal head ball  Surgeon: Lind Guest. Ninfa Linden, MD Assistant: Benita Stabile, PA-C  Anesthesia: General EBL: 250 cc Antibiotics: 2 g IV Ancef Complications: None  Indications: The patient is a 76 year old gentleman who sustained a displaced left hip subcapital femoral neck fracture yesterday after mechanical fall.  Actually replaced his right hip last year in November after mechanical fall injuring that right hip.  He has had balance and coordination issues and this has been worked up extensively.  We have had him through physical therapy as well.  He had an accidental mechanical fall yesterday when he stumbled.  He was brought to the [facility] emergency room and found to have a displaced left hip femoral neck fracture.  He is on Plavix and has been stopped.  We recommended a total hip arthroplasty.  The risk and benefits of surgery been explained in detail.  He was graciously admitted to the medicine service for medical clearance for surgery as well.  He understands the risk of acute blood loss anemia, nerve vessel injury, fracture, infection, dislocation, implant failure, DVT, leg length differences and wound healing issues.  He understands her goals are decreased pain, improve mobility and overall improve quality of life.  Procedure description: After informed consent was obtained and appropriate left hip was marked, the patient was brought to the operating room and general anesthesia was obtained while he was on a stretcher.  Next traction boots were placed on both his feet and he was placed supine on the Hana fracture table with a perineal post in place  in both legs and inline skeletal traction devices no traction applied.  His left operative hip was assessed radiographically under fluoroscopic guidance.  The left hip was then prepped and draped with DuraPrep and sterile drapes.  A timeout was called and he was then applied as the correct patient the correct left hip.  An incision was then made just inferior and posterior to the ASIS and carried slightly bleak and down the leg.  Dissection was carried down the tensor fascia lata muscle and tensor fascia was then divided longitudinally to proceed with direct and approach the hip.  Circumflex vessels were identified and cauterized and the hip capsule was opened up in L-type format finding a hemarthrosis and a displaced femoral neck fracture.  This was between a subcapital and transcervical fracture.  An oscillating saw was used to make a femoral neck cut just distal to the fracture and proximal to the lesser trochanter.  A corkscrew guide was placed in the femoral head and the femoral head was removed in its entirety.  We then removed other bony debris from the acetabulum as well as remnants of the acetabular labrum and other debris.  A bent Hohmann was placed over the medial Aster rim.  Reaming was then initiated from a size 43 reamer and stepwise increments going up to a size 57 reamer with all reamers placed under direct visitation and the last reamer was placed under direct fluoroscopy to obtain our depth reaming, the inclination and anteversion.  The real DePuy sector GRIPTION acetabular component was then placed size 58 without difficulty and a 36+4 polythene liner was then placed for that size 58 acetabular component.  Attention was  then turned to the femur.  With the left leg externally rotated to 120 degrees, extended and brought down under the other leg, a Mueller retractors were placed by the medial calcar and a Hohmann tractor was placed behind the greater trochanter.  The lateral joint capsule was  released and a box cutting osteotome was used in the femoral canal.  Broaching was then started from a size 0 broach going up and stepwise increments to a size 7 broach.  We then trialed a high offset femoral neck and a 36+1.5 trial hip ball.  The leg was brought over and up with traction interpretation this was reduced in the pelvis.  It felt stable but I did feel like we needed more leg length.  We dislocated the hip and remove the trial components.  We placed the real high offset Actis femoral component size 7 and the real 36+5 metal head ball and again reduce this in the acetabulum and I was definitely pleased with leg length, offset, range of motion and stability assessed both clinically and radiographically.  The soft tissue was then irrigated using saline solution.  The joint capsule was closed with interrupted #1 Ethibond suture followed by #1 Vicryl to close the tensor fascia.  0 Vicryl is used to close the deep tissue and 2-0 Vicryl used to close subcutaneous tissue.  The skin was closed with staples.  An Aquacel dressing was applied.  The patient was taken off the Hana table, awakened, extubated and taken recovery room in stable condition.  All final counts were correct and no complications noted.  Benita Stabile, PA-C did assist during the entire case and beginning to end and his assistance was medically necessary and crucial for soft tissue management and retraction, helping guide implant placement and a layered closure of the wound.

## 2022-03-03 NOTE — Anesthesia Procedure Notes (Signed)
Procedure Name: Intubation Date/Time: 03/03/2022 2:29 PM  Performed by: Jenne Campus, CRNAPre-anesthesia Checklist: Patient identified, Emergency Drugs available, Suction available and Patient being monitored Patient Re-evaluated:Patient Re-evaluated prior to induction Oxygen Delivery Method: Circle System Utilized Preoxygenation: Pre-oxygenation with 100% oxygen Induction Type: IV induction Ventilation: Mask ventilation without difficulty and Oral airway inserted - appropriate to patient size Laryngoscope Size: Miller and 3 Grade View: Grade II Tube type: Oral Tube size: 7.5 mm Number of attempts: 1 Airway Equipment and Method: Stylet and Oral airway Placement Confirmation: ETT inserted through vocal cords under direct vision, positive ETCO2 and breath sounds checked- equal and bilateral Secured at: 22 cm Tube secured with: Tape Dental Injury: Teeth and Oropharynx as per pre-operative assessment  Difficulty Due To: Difficult Airway- due to limited oral opening and Difficult Airway- due to reduced neck mobility

## 2022-03-04 DIAGNOSIS — S72001A Fracture of unspecified part of neck of right femur, initial encounter for closed fracture: Secondary | ICD-10-CM | POA: Diagnosis not present

## 2022-03-04 LAB — BASIC METABOLIC PANEL
Anion gap: 10 (ref 5–15)
BUN: 30 mg/dL — ABNORMAL HIGH (ref 8–23)
CO2: 20 mmol/L — ABNORMAL LOW (ref 22–32)
Calcium: 8.1 mg/dL — ABNORMAL LOW (ref 8.9–10.3)
Chloride: 105 mmol/L (ref 98–111)
Creatinine, Ser: 1.23 mg/dL (ref 0.61–1.24)
GFR, Estimated: >60 mL/min (ref >60)
Glucose, Bld: 122 mg/dL — ABNORMAL HIGH (ref 70–99)
Potassium: 4.3 mmol/L (ref 3.5–5.1)
Sodium: 135 mmol/L (ref 135–145)

## 2022-03-04 LAB — GLUCOSE, CAPILLARY
Glucose-Capillary: 112 mg/dL — ABNORMAL HIGH (ref 70–99)
Glucose-Capillary: 122 mg/dL — ABNORMAL HIGH (ref 70–99)
Glucose-Capillary: 162 mg/dL — ABNORMAL HIGH (ref 70–99)
Glucose-Capillary: 175 mg/dL — ABNORMAL HIGH (ref 70–99)

## 2022-03-04 LAB — CBC WITH DIFFERENTIAL/PLATELET
Abs Immature Granulocytes: 0.06 10*3/uL (ref 0.00–0.07)
Basophils Absolute: 0 10*3/uL (ref 0.0–0.1)
Basophils Relative: 0 %
Eosinophils Absolute: 0 10*3/uL (ref 0.0–0.5)
Eosinophils Relative: 0 %
HCT: 35.2 % — ABNORMAL LOW (ref 39.0–52.0)
Hemoglobin: 11.6 g/dL — ABNORMAL LOW (ref 13.0–17.0)
Immature Granulocytes: 1 %
Lymphocytes Relative: 7 %
Lymphs Abs: 0.6 10*3/uL — ABNORMAL LOW (ref 0.7–4.0)
MCH: 31.8 pg (ref 26.0–34.0)
MCHC: 33 g/dL (ref 30.0–36.0)
MCV: 96.4 fL (ref 80.0–100.0)
Monocytes Absolute: 0.4 10*3/uL (ref 0.1–1.0)
Monocytes Relative: 4 %
Neutro Abs: 8.6 10*3/uL — ABNORMAL HIGH (ref 1.7–7.7)
Neutrophils Relative %: 88 %
Platelets: 197 10*3/uL (ref 150–400)
RBC: 3.65 MIL/uL — ABNORMAL LOW (ref 4.22–5.81)
RDW: 13.5 % (ref 11.5–15.5)
WBC: 9.7 10*3/uL (ref 4.0–10.5)
nRBC: 0 % (ref 0.0–0.2)

## 2022-03-04 LAB — HEMOGLOBIN A1C
Hgb A1c MFr Bld: 6.9 % — ABNORMAL HIGH (ref 4.8–5.6)
Mean Plasma Glucose: 151 mg/dL

## 2022-03-04 MED ORDER — RAMIPRIL 5 MG PO CAPS
5.0000 mg | ORAL_CAPSULE | Freq: Two times a day (BID) | ORAL | Status: DC
  Administered 2022-03-04 – 2022-03-08 (×9): 5 mg via ORAL
  Filled 2022-03-04 (×10): qty 1

## 2022-03-04 MED ORDER — GUAIFENESIN-DM 100-10 MG/5ML PO SYRP
10.0000 mL | ORAL_SOLUTION | ORAL | Status: DC | PRN
  Administered 2022-03-04 – 2022-03-06 (×3): 10 mL via ORAL
  Filled 2022-03-04 (×3): qty 10

## 2022-03-04 NOTE — Progress Notes (Signed)
Subjective: 1 Day Post-Op Procedure(s) (LRB): TOTAL HIP ARTHROPLASTY ANTERIOR APPROACH (Left) Patient reports pain as moderate.  Tolerated surgery well. Awake and alert this am and following commands appropriately.  Objective: Vital signs in last 24 hours: Temp:  [97.6 F (36.4 C)-98.6 F (37 C)] 98.4 F (36.9 C) (12/02 0507) Pulse Rate:  [75-95] 75 (12/02 0507) Resp:  [15-22] 18 (12/02 0507) BP: (130-190)/(65-91) 147/75 (12/02 0507) SpO2:  [92 %-99 %] 92 % (12/02 0507)  Intake/Output from previous day: 12/01 0701 - 12/02 0700 In: 1874 [I.V.:1519; IV Piggyback:355] Out: 1750 [Urine:1500; Blood:250] Intake/Output this shift: No intake/output data recorded.  Recent Labs    03/02/22 1440 03/03/22 0630  HGB 15.1 13.0   Recent Labs    03/02/22 1440 03/03/22 0630  WBC 11.3* 8.0  RBC 4.71 4.05*  HCT 45.9 39.3  PLT 219 158   Recent Labs    03/03/22 0630 03/04/22 0624  NA 137 135  K 4.3 4.3  CL 105 105  CO2 23 20*  BUN 27* 30*  CREATININE 1.26* 1.23  GLUCOSE 116* 122*  CALCIUM 8.4* 8.1*   Recent Labs    03/02/22 1440  INR 1.1    Sensation intact distally Intact pulses distally Dorsiflexion/Plantar flexion intact Incision: scant drainage   Assessment/Plan: 1 Day Post-Op Procedure(s) (LRB): TOTAL HIP ARTHROPLASTY ANTERIOR APPROACH (Left) Up with therapy WBAT left hip Resume Plavix today.     Mcarthur Rossetti 03/04/2022, 8:38 AM

## 2022-03-04 NOTE — Evaluation (Signed)
Physical Therapy Evaluation Patient Details Name: Joel Gonzales. MRN: [id number] DOB: [birth year] Today's Date: 03/04/2022  History of Present Illness  Joel Gonzales. is a 76 y.o. 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 Was ambulating and felt himself getting faster and faster and couldn't stop himself and fell into his car and fell.  He suffered L intra-articular left femoral neck fracture and is s/p L THA anterior approach (03/03/22)  Clinical Impression  Pt admitted with above diagnosis.  Pt currently with functional limitations due to the deficits listed below (see PT Problem List). Pt will benefit from skilled PT to increase their independence and safety with mobility to allow discharge to the venue listed below.  Pt with c/o weakness during eval more so than pain.  He went through this last year with his other leg and reported having HHPT at that time.         Recommendations for follow up therapy are one component of a multi-disciplinary discharge planning process, led by the attending physician.  Recommendations may be updated based on patient status, additional functional criteria and insurance authorization.  Follow Up Recommendations Follow physician's recommendations for discharge plan and follow up therapies      Assistance Recommended at Discharge Intermittent Supervision/Assistance  Patient can return home with the following  A little help with walking and/or transfers;Help with stairs or ramp for entrance;A little help with bathing/dressing/bathroom    Equipment Recommendations None recommended by PT  Recommendations for Other Services       Functional Status Assessment Patient has had a recent decline in their functional status and demonstrates the ability to make significant improvements in function in a reasonable and predictable amount of time.     Precautions  / Restrictions Restrictions LLE Weight Bearing: Weight bearing as tolerated      Mobility  Bed Mobility Overal bed mobility: Needs Assistance Bed Mobility: Supine to Sit     Supine to sit: Min assist, +2 for physical assistance     General bed mobility comments: A for L LE and for trunk    Transfers Overall transfer level: Needs assistance   Transfers: Sit to/from Stand Sit to Stand: Min assist, +2 physical assistance           General transfer comment: MIN A of 2 to power up with cues for hand placement. Slight posterior lean noted.    Ambulation/Gait Ambulation/Gait assistance: Min assist, +2 safety/equipment Gait Distance (Feet): 8 Feet Assistive device: Rolling walker (2 wheels) Gait Pattern/deviations: Step-to pattern       General Gait Details: Pt felt weakness in LE, not so much pain and was only able to sidestep and then turn to recliner with cues for foot and hand placement.  Stairs            Wheelchair Mobility    Modified Rankin (Stroke Patients Only)       Balance                                             Pertinent Vitals/Pain Pain Assessment Pain Assessment: 0-10 Pain Score: 4  Pain Location: L hip with movement Pain Descriptors / Indicators: Grimacing Pain Intervention(s): Limited activity within patient's tolerance, Repositioned, Monitored during session    Home  Living Family/patient expects to be discharged to:: Private residence Living Arrangements: Spouse/significant other Available Help at Discharge: Family Type of Home: House Home Access: Stairs to enter     Alternate Level Stairs-Number of Steps: flight- Home Layout: Two level;Able to live on main level with bedroom/bathroom Home Equipment: Rolling Walker (2 wheels);Standard Walker;BSC/3in1      Prior Function Prior Level of Function : Independent/Modified Independent             Mobility Comments: He fell about a year ago and broke R hip  and so he has gone through this before.       Hand Dominance   Dominant Hand: Right    Extremity/Trunk Assessment   Upper Extremity Assessment Upper Extremity Assessment: Overall WFL for tasks assessed    Lower Extremity Assessment Lower Extremity Assessment: LLE deficits/detail LLE Deficits / Details: ROM limited 50% in supine       Communication   Communication: No difficulties  Cognition Arousal/Alertness: Awake/alert Behavior During Therapy: WFL for tasks assessed/performed Overall Cognitive Status: Within Functional Limits for tasks assessed                                          General Comments      Exercises Total Joint Exercises Ankle Circles/Pumps: AROM, Both Quad Sets: Both, Supine, 5 reps Heel Slides: Left, Supine, AROM, 5 reps   Assessment/Plan    PT Assessment Patient needs continued PT services  PT Problem List Decreased strength;Decreased range of motion;Decreased activity tolerance;Decreased mobility;Decreased balance       PT Treatment Interventions DME instruction;Gait training;Therapeutic exercise;Therapeutic activities;Functional mobility training;Stair training    PT Goals (Current goals can be found in the Care Plan section)  Acute Rehab PT Goals Patient Stated Goal: Go home PT Goal Formulation: With patient Time For Goal Achievement: 03/18/22 Potential to Achieve Goals: Good    Frequency Min 5X/week     Co-evaluation               AM-PAC PT "6 Clicks" Mobility  Outcome Measure Help needed turning from your back to your side while in a flat bed without using bedrails?: A Little Help needed moving from lying on your back to sitting on the side of a flat bed without using bedrails?: A Lot Help needed moving to and from a bed to a chair (including a wheelchair)?: A Little Help needed standing up from a chair using your arms (e.g., wheelchair or bedside chair)?: A Little Help needed to walk in hospital room?:  A Little Help needed climbing 3-5 steps with a railing? : A Lot 6 Click Score: 16    End of Session Equipment Utilized During Treatment: Gait belt Activity Tolerance: Patient limited by fatigue Patient left: in chair;with call bell/phone within reach Nurse Communication: Mobility status PT Visit Diagnosis: Unsteadiness on feet (R26.81);Difficulty in walking, not elsewhere classified (R26.2);Pain Pain - Right/Left: Left Pain - part of body: Hip    Time: 1340-1409 PT Time Calculation (min) (ACUTE ONLY): 29 min   Charges:   PT Evaluation $PT Eval Moderate Complexity: 1 Mod PT Treatments $Therapeutic Exercise: 8-22 mins        Lydia Guiles., PT Office [phone] Acute Rehab 03/04/2022   Galen Manila 03/04/2022, 3:06 PM

## 2022-03-04 NOTE — Progress Notes (Signed)
PROGRESS NOTE  Joel Gonzales.  DOB: 03-19-1946  PCP: Haywood Pao, MD DXI:[id number]  DOA: 03/02/2022  LOS: 1 day  Hospital Day: 3  Brief narrative: Joel Fortunato. is a 76 y.o. 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 Today, patient was brought into ED after an episode of fall. Patient was diagnosed with vascular parkinsonism 4 years ago at [facility].  He has trouble with his gait.  He sometimes has trouble stopping and tends to fall forward.  Today he was walking, fell forward hitting his face against a parked car and fell to the ground impacting his left hip.  He sustained a scrap to his nose and forehead and started hurting in the left hip and is brought to the ED. In the ED, hemodynamically stable, breathing on room air. Labs with WC count 11.3, hemoglobin 15.1, platelet 219, BUN/creatinine 29/1.19, glucose level elevated to 189. CT scan of left hip showed an acute intra-articular left femoral neck fracture with transcervical and subcapital components and mild apex anterior angulation. CT scan of head, cervical spine and maxillofacial were also obtained. No acute intracranial abnormality. Negative for cervical spine fracture. Atrophy and chronic microvascular ischemic change in the white matter. Fracture of the nasal bone with mild displacement. No other facial fracture.  ED physician discussed the case with Orthopedics Dr. [facility] service consulted for admission and management.  Subjective: Patient was seen and examined this morning.   Lying on bed.  Not in distress.  Uneventful night after surgery yesterday.  Family not at bedside today.  Pain controlled.  Assessment/Plan: Acute intra-articular left femoral neck fracture S/p left direct anterior THA - 12/1 Dr. Ninfa Linden PT eval pending. 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  He has not started it yet.  Will wait till after the procedure to start it.  Nasal bone fracture Mild swelling noted.  No external injury.  Pain controlled.  No need of surgical intervention  Type 2 diabetes mellitus A1c 6.9 in 2022. PTA on Synjardy, Actos Hold oral meds.  Start on sliding scale insulin with Accu-Cheks Recent Labs  Lab 03/03/22 1158 03/03/22 1601 03/03/22 2139 03/04/22 0738 03/04/22 1123  GLUCAP 104* 135* 232* 112* 122*   CAD/CABG  HLD  PTA on metoprolol, Plavix, statin  Continue metoprolol.  Resume Plavix and statin today.  Essential hypertension On Toprol 50 mg daily and ramipril 5 mg twice daily Continue Toprol.  Resume ramipril today. Continue monitor blood pressure.  Hydralazine IV as needed  Mobility: Needs PT eval after surgery  Goals of care   Code Status: Full Code   Diet: Diet Order             Diet regular Room service appropriate? Yes; Fluid consistency: Thin  Diet effective now                   Nutritional status:  Body mass index is 26.61 kg/m.       DVT prophylaxis: Lovenox subcu enoxaparin (LOVENOX) injection 40 mg Start: 03/03/22 2000 SCDs Start: 03/03/22 1743   Antimicrobials: None Fluid: Stop IV fluid today Consultants: Orthopedics Family Communication: Wife not at bedside  today  Status is: Inpatient  Continue in-hospital care because: s/p ORIF, POD 1 Level of care: Telemetry   Dispo: The patient is from: Home              Anticipated d/c is to: Pending clinical course.  Pending PT eval              Patient currently is not medically stable to d/c.   Difficult to place patient No     Infusions:   sodium chloride     methocarbamol (ROBAXIN) IV     methocarbamol  (ROBAXIN) IV Stopped (03/03/22 1722)    Scheduled Meds:  atorvastatin  80 mg Oral QHS   clopidogrel  75 mg Oral Daily   docusate sodium  100 mg Oral BID   empagliflozin  25 mg Oral Q lunch   And   metFORMIN  1,000 mg Oral Q lunch   enoxaparin (LOVENOX) injection  40 mg Subcutaneous Q24H   insulin aspart  0-5 Units Subcutaneous QHS   insulin aspart  0-9 Units Subcutaneous TID WC   metoprolol succinate  50 mg Oral Daily   pantoprazole  40 mg Oral Daily   pioglitazone  15 mg Oral Daily   ramipril  5 mg Oral BID   senna  1 tablet Oral BID    PRN meds: acetaminophen **OR** acetaminophen, acetaminophen, albuterol, bisacodyl, diphenhydrAMINE, hydrALAZINE, HYDROmorphone (DILAUDID) injection, menthol-cetylpyridinium **OR** phenol, methocarbamol (ROBAXIN) IV, methocarbamol **OR** methocarbamol (ROBAXIN) IV, metoCLOPramide **OR** metoCLOPramide (REGLAN) injection, morphine injection, ondansetron **OR** ondansetron (ZOFRAN) IV, oxyCODONE, oxyCODONE, oxyCODONE   Antimicrobials: Anti-infectives (From admission, onward)    Start     Dose/Rate Route Frequency Ordered Stop   03/03/22 2000  ceFAZolin (ANCEF) IVPB 1 g/50 mL premix        1 g 100 mL/hr over 30 Minutes Intravenous Every 6 hours 03/03/22 1742 03/04/22 0216   03/03/22 1330  ceFAZolin (ANCEF) IVPB 2g/100 mL premix        2 g 200 mL/hr over 30 Minutes Intravenous  Once 03/03/22 1328 03/03/22 1435   03/03/22 1329  ceFAZolin (ANCEF) 2-4 GM/100ML-% IVPB       Note to Pharmacy: Mardelle Matte R: cabinet override      03/03/22 1329 03/03/22 1443       Objective: Vitals:   03/04/22 0507 03/04/22 1324  BP: (!) 147/75 (!) 125/56  Pulse: 75 82  Resp: 18 17  Temp: 98.4 F (36.9 C) 98.4 F (36.9 C)  SpO2: 92% 93%    Intake/Output Summary (Last 24 hours) at 03/04/2022 1410 Last data filed at 03/04/2022 1125 Gross per 24 hour  Intake 1873.95 ml  Output 2350 ml  Net -476.05 ml   Filed Weights   03/02/22 1915  Weight: 89 kg    Weight change:  Body mass index is 26.61 kg/m.   Physical Exam: General exam: Pleasant, elderly Caucasian male.  Pain controlled. Skin: No rashes, lesions or ulcers. HEENT: Swollen nose because of nasal bone fracture, normocephalic, no obvious bleeding Lungs: Clear to auscultation bilaterally CVS: Regular rate and rhythm, no murmur.  GI/Abd soft, nontender, nondistended, bowel sound. CNS: Alert, awake, oriented x 3 Psychiatry: Mood appropriate Extremities: No pedal edema, no calf tenderness  Data Review: I have personally reviewed the laboratory data and studies available.  F/u labs ordered Unresulted Labs (From admission, onward)    None       Signed, Terrilee Croak, MD [facility] 03/04/2022

## 2022-03-05 DIAGNOSIS — S72001A Fracture of unspecified part of neck of right femur, initial encounter for closed fracture: Secondary | ICD-10-CM | POA: Diagnosis not present

## 2022-03-05 LAB — GLUCOSE, CAPILLARY
Glucose-Capillary: 123 mg/dL — ABNORMAL HIGH (ref 70–99)
Glucose-Capillary: 141 mg/dL — ABNORMAL HIGH (ref 70–99)
Glucose-Capillary: 111 mg/dL — ABNORMAL HIGH (ref 70–99)
Glucose-Capillary: 180 mg/dL — ABNORMAL HIGH (ref 70–99)

## 2022-03-05 NOTE — Discharge Instructions (Signed)

## 2022-03-05 NOTE — Anesthesia Postprocedure Evaluation (Signed)
Anesthesia Post Note  Patient: Joel Gonzales.  Procedure(s) Performed: TOTAL HIP ARTHROPLASTY ANTERIOR APPROACH (Left: Hip)     Patient location during evaluation: PACU Anesthesia Type: General Level of consciousness: awake and alert Pain management: pain level controlled Vital Signs Assessment: post-procedure vital signs reviewed and stable Respiratory status: spontaneous breathing, nonlabored ventilation, respiratory function stable and patient connected to nasal cannula oxygen Cardiovascular status: blood pressure returned to baseline and stable Postop Assessment: no apparent nausea or vomiting Anesthetic complications: no  No notable events documented.  Last Vitals:  Vitals:   03/05/22 0547 03/05/22 1508  BP: 136/72 (!) 139/119  Pulse: 83 68  Resp: 18 16  Temp: 36.8 C 37 C  SpO2: 96% 97%    Last Pain:  Vitals:   03/05/22 1508  TempSrc: Oral  PainSc:    Pain Goal: Patients Stated Pain Goal: 0 (03/05/22 1126)                 [name]

## 2022-03-05 NOTE — Progress Notes (Signed)
Physical Therapy Treatment Patient Details Name: Joel Gonzales. MRN: [id number] DOB: [birth year] Today's Date: 03/05/2022   History of Present Illness [place]. is a 76 y.o. 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 Was ambulating and felt himself getting faster and faster and couldn't stop himself and fell into his car and fell.  He suffered L intra-articular left femoral neck fracture and is s/p L THA anterior approach (03/03/22)    PT Comments    Pt did not ambulate as far in afternoon session, but gait was improved and didn't have recliner follow.  Continues to need cues for initial forward weight shift upon standing. Needs stair training.   Recommendations for follow up therapy are one component of a multi-disciplinary discharge planning process, led by the attending physician.  Recommendations may be updated based on patient status, additional functional criteria and insurance authorization.  Follow Up Recommendations  Follow physician's recommendations for discharge plan and follow up therapies     Assistance Recommended at Discharge Intermittent Supervision/Assistance  Patient can return home with the following A little help with walking and/or transfers;Help with stairs or ramp for entrance;A little help with bathing/dressing/bathroom   Equipment Recommendations  None recommended by PT    Recommendations for Other Services       Precautions / Restrictions Restrictions LLE Weight Bearing: Weight bearing as tolerated     Mobility  Bed Mobility Overal bed mobility: Needs Assistance Bed Mobility: Sit to Supine     Supine to sit: Min assist Sit to supine: Min assist   General bed mobility comments: MIN A for lifting L LE onto bed    Transfers Overall transfer level: Needs assistance Equipment used: Rolling walker (2 wheels) Transfers: Sit to/from Stand Sit  to Stand: Min assist           General transfer comment: MIN A from recliner with cues for technique and shifting weight forward    Ambulation/Gait Ambulation/Gait assistance: Min assist Gait Distance (Feet): 40 Feet Assistive device: Rolling walker (2 wheels) Gait Pattern/deviations: Step-through pattern Gait velocity: decreased     General Gait Details: Increased step length and cadence compared to AM gait session.   Stairs             Wheelchair Mobility    Modified Rankin (Stroke Patients Only)       Balance                                            Cognition Arousal/Alertness: Awake/alert Behavior During Therapy: WFL for tasks assessed/performed Overall Cognitive Status: Within Functional Limits for tasks assessed                                          Exercises Total Joint Exercises Ankle Circles/Pumps: AROM, Both Quad Sets: Both, Supine, 5 reps Gluteal Sets: Strengthening, Both, 5 reps Heel Slides: Left, Supine, AROM, 5 reps Hip ABduction/ADduction: AAROM, Left, 5 reps, Supine    General Comments        Pertinent Vitals/Pain Pain Assessment Pain Assessment: 0-10 Pain Score: 2  Pain Location: L hip with movement Pain Descriptors / Indicators: Grimacing Pain Intervention(s): Monitored during session  Home Living                          Prior Function            PT Goals (current goals can now be found in the care plan section) Acute Rehab PT Goals Patient Stated Goal: Go home PT Goal Formulation: With patient Time For Goal Achievement: 03/18/22 Potential to Achieve Goals: Good Progress towards PT goals: Progressing toward goals    Frequency    Min 5X/week      PT Plan Current plan remains appropriate    Co-evaluation              AM-PAC PT "6 Clicks" Mobility   Outcome Measure  Help needed turning from your back to your side while in a flat bed without using  bedrails?: A Little Help needed moving from lying on your back to sitting on the side of a flat bed without using bedrails?: A Little Help needed moving to and from a bed to a chair (including a wheelchair)?: A Little Help needed standing up from a chair using your arms (e.g., wheelchair or bedside chair)?: A Little Help needed to walk in hospital room?: A Little Help needed climbing 3-5 steps with a railing? : A Lot 6 Click Score: 17    End of Session Equipment Utilized During Treatment: Gait belt Activity Tolerance: Patient tolerated treatment well Patient left: in bed;with call bell/phone within reach;with nursing/sitter in room;with family/visitor present Nurse Communication: Mobility status (nurse tech) PT Visit Diagnosis: Unsteadiness on feet (R26.81);Difficulty in walking, not elsewhere classified (R26.2);Pain Pain - Right/Left: Left Pain - part of body: Hip     Time: 1410-1419 PT Time Calculation (min) (ACUTE ONLY): 9 min  Charges:  $Gait Training: 8-22 mins                     Lydia Guiles., PT Office [phone] Acute Rehab 03/05/2022    Galen Manila 03/05/2022, 4:14 PM

## 2022-03-05 NOTE — Progress Notes (Signed)
Subjective: 2 Days Post-Op Procedure(s) (LRB): TOTAL HIP ARTHROPLASTY ANTERIOR APPROACH (Left) Patient reports pain as moderate.    Objective: Vital signs in last 24 hours: Temp:  [98.3 F (36.8 C)-98.4 F (36.9 C)] 98.3 F (36.8 C) (12/03 0547) Pulse Rate:  [79-83] 83 (12/03 0547) Resp:  [17-18] 18 (12/03 0547) BP: (124-136)/(56-72) 136/72 (12/03 0547) SpO2:  [93 %-96 %] 96 % (12/03 0547)  Intake/Output from previous day: 12/02 0701 - 12/03 0700 In: 744.2 [I.V.:744.2] Out: 2800 [Urine:2800] Intake/Output this shift: No intake/output data recorded.  Recent Labs    03/02/22 1440 03/03/22 0630 03/04/22 0624  HGB 15.1 13.0 11.6*   Recent Labs    03/03/22 0630 03/04/22 0624  WBC 8.0 9.7  RBC 4.05* 3.65*  HCT 39.3 35.2*  PLT 158 197   Recent Labs    03/03/22 0630 03/04/22 0624  NA 137 135  K 4.3 4.3  CL 105 105  CO2 23 20*  BUN 27* 30*  CREATININE 1.26* 1.23  GLUCOSE 116* 122*  CALCIUM 8.4* 8.1*   Recent Labs    03/02/22 1440  INR 1.1    Incision: scant drainage Compartment soft   Assessment/Plan: 2 Days Post-Op Procedure(s) (LRB): TOTAL HIP ARTHROPLASTY ANTERIOR APPROACH (Left) Up with therapy      Joel Gonzales 03/05/2022, 9:34 AM

## 2022-03-05 NOTE — Progress Notes (Signed)
Physical Therapy Treatment Patient Details Name: Joel Gonzales. MRN: [id number] DOB: 1945/12/21 Today's Date: 03/05/2022   History of Present Illness Yago Ludvigsen. is a 76 y.o. 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 Was ambulating and felt himself getting faster and faster and couldn't stop himself and fell into his car and fell.  He suffered L intra-articular left femoral neck fracture and is s/p L THA anterior approach (03/03/22)    PT Comments    Pt able to increase ambulation distance into hallway. Initially tends to have a slight posterior lean and needs cueing to get COG over BOS.   Recommendations for follow up therapy are one component of a multi-disciplinary discharge planning process, led by the attending physician.  Recommendations may be updated based on patient status, additional functional criteria and insurance authorization.  Follow Up Recommendations  Follow physician's recommendations for discharge plan and follow up therapies     Assistance Recommended at Discharge Intermittent Supervision/Assistance  Patient can return home with the following A little help with walking and/or transfers;Help with stairs or ramp for entrance;A little help with bathing/dressing/bathroom   Equipment Recommendations  None recommended by PT    Recommendations for Other Services       Precautions / Restrictions Restrictions Weight Bearing Restrictions: Yes LLE Weight Bearing: Weight bearing as tolerated     Mobility  Bed Mobility Overal bed mobility: Needs Assistance Bed Mobility: Supine to Sit     Supine to sit: Min assist     General bed mobility comments: A for L LE and trunk, slow transition    Transfers Overall transfer level: Needs assistance Equipment used: Rolling walker (2 wheels) Transfers: Sit to/from Stand Sit to Stand: Min assist, +2  safety/equipment           General transfer comment: MIN A with cueing to shift weight forward    Ambulation/Gait Ambulation/Gait assistance: Min assist, +2 safety/equipment Gait Distance (Feet): 50 Feet Assistive device: Rolling walker (2 wheels) Gait Pattern/deviations: Step-to pattern, Step-through pattern, Decreased step length - right, Decreased step length - left Gait velocity: decreased, but improved as gait progressed     General Gait Details: Amb with RW and recliner follow due to pt c/o weakness more so than pain.  Decreased step length, but as gait progressed step increased and pattern normalized.   Stairs             Wheelchair Mobility    Modified Rankin (Stroke Patients Only)       Balance                                            Cognition Arousal/Alertness: Awake/alert Behavior During Therapy: WFL for tasks assessed/performed Overall Cognitive Status: Within Functional Limits for tasks assessed                                          Exercises Total Joint Exercises Ankle Circles/Pumps: AROM, Both Quad Sets: Both, Supine, 5 reps Gluteal Sets: Strengthening, Both, 5 reps Heel Slides: Left, Supine, AROM, 5 reps Hip ABduction/ADduction: AAROM, Left, 5 reps, Supine    General Comments  Pertinent Vitals/Pain Pain Assessment Pain Assessment: 0-10 Pain Score: 2  Pain Location: L hip with movement Pain Descriptors / Indicators: Grimacing Pain Intervention(s): Premedicated before session, Monitored during session    Home Living                          Prior Function            PT Goals (current goals can now be found in the care plan section) Acute Rehab PT Goals Patient Stated Goal: Go home PT Goal Formulation: With patient Time For Goal Achievement: 03/18/22 Potential to Achieve Goals: Good    Frequency    Min 5X/week      PT Plan Current plan remains appropriate     Co-evaluation              AM-PAC PT "6 Clicks" Mobility   Outcome Measure  Help needed turning from your back to your side while in a flat bed without using bedrails?: A Little Help needed moving from lying on your back to sitting on the side of a flat bed without using bedrails?: A Little Help needed moving to and from a bed to a chair (including a wheelchair)?: A Little Help needed standing up from a chair using your arms (e.g., wheelchair or bedside chair)?: A Little Help needed to walk in hospital room?: A Little Help needed climbing 3-5 steps with a railing? : A Lot 6 Click Score: 17    End of Session Equipment Utilized During Treatment: Gait belt Activity Tolerance: Patient tolerated treatment well Patient left: in chair;with call bell/phone within reach;with nursing/sitter in room Nurse Communication: Mobility status PT Visit Diagnosis: Unsteadiness on feet (R26.81);Difficulty in walking, not elsewhere classified (R26.2);Pain Pain - Right/Left: Left Pain - part of body: Hip     Time: 8242-3536 PT Time Calculation (min) (ACUTE ONLY): 23 min  Charges:  $Gait Training: 8-22 mins $Therapeutic Exercise: 8-22 mins                     Lydia Guiles., PT Office [phone] Acute Rehab 03/05/2022    Galen Manila 03/05/2022, 1:00 PM

## 2022-03-05 NOTE — TOC Initial Note (Signed)
Transition of Care (TOC) - Initial/Assessment Note    Patient Details  Name: Joel Gonzales. MRN: [id number] Date of Birth: May 03, 1945  Transition of Care [facility]) CM/SW Contact:    Henrietta Dine, RN Phone Number: 03/05/2022, 12:33 PM  Clinical Narrative:                 [facility] consult for d/c needs; PT eval recc follow MD instructions; spoke wi/ pt and wife Bertie in room; they say the pt is from home and plans to return at d/c; the pt says he has transportation; the pt says he wears glasses; he says he does not have hearing aids or dentures; the pt says he has a cane, walker, and shower chair; no TOC needs.        Patient Goals and CMS Choice        Expected Discharge Plan and Services                                                Prior Living Arrangements/Services                       Activities of Daily Living Home Assistive Devices/Equipment: Cane (specify quad or straight) (straight) ADL Screening (condition at time of admission) Patient's cognitive ability adequate to safely complete daily activities?: Yes Is the patient deaf or have difficulty hearing?: No Does the patient have difficulty seeing, even when wearing glasses/contacts?: No Does the patient have difficulty concentrating, remembering, or making decisions?: No Patient able to express need for assistance with ADLs?: Yes Does the patient have difficulty dressing or bathing?: Yes Independently performs ADLs?: No Communication: Independent Dressing (OT): Needs assistance Is this a change from baseline?: Change from baseline, expected to last <3days Grooming: Independent Feeding: Independent Bathing: Needs assistance Is this a change from baseline?: Change from baseline, expected to last >3 days Toileting: Needs assistance Is this a change from baseline?: Change from baseline, expected to last >3days In/Out Bed: Needs assistance Is this a change from baseline?: Change from  baseline, expected to last >3 days Walks in Home: Independent with device (comment) Does the patient have difficulty walking or climbing stairs?: Yes Weakness of Legs: Both Weakness of Arms/Hands: None  Permission Sought/Granted                  Emotional Assessment              Admission diagnosis:  Closed right hip fracture ([place]) [S72.001A] Closed fracture of nasal bone, initial encounter [S02.2XXA] Closed fracture of left hip, initial encounter [facility]) [S72.002A] Patient Active Problem List   Diagnosis Date Noted   Subcapital fracture of neck of femur, left, closed, initial encounter ([place]) 03/03/2022   Closed right hip fracture ([place]) 03/02/2022   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   White matter abnormality on MRI of brain 03/01/2022   Malnutrition of moderate degree 02/19/2021   Femoral fracture ([place]) 02/17/2021   Hip fracture ([place]) 02/17/2021   Acute ischemic stroke ([place]) 03/05/2017   Gait disturbance 03/05/2017   Unsteadiness    Vascular parkinsonism ([place])    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   Essential hypertension 02/13/2013   Hyperlipidemia with target LDL less than 130 02/13/2013   Right bundle branch block 02/13/2013   PCP:  Tisovec, Fransico Him, MD Pharmacy:   [facility]  [place], [place] [address] Phone: [phone] Fax: [place], [place] [address] Phone: [phone] Fax: [phone]     Social Determinants of Health (SDOH) Interventions    Readmission Risk Interventions     No data to display

## 2022-03-05 NOTE — Progress Notes (Signed)
PROGRESS NOTE  Joel Gonzales.  DOB: 1945/12/04  PCP: Haywood Pao, MD PNT:[id number]  DOA: 03/02/2022  LOS: 2 days  Hospital Day: 4  Brief narrative: Joel Gonzales. is a 76 y.o. 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 Today, patient was brought into ED after an episode of fall. Patient was diagnosed with vascular parkinsonism 4 years ago at [facility].  He has trouble with his gait.  He sometimes has trouble stopping and tends to fall forward.  Today he was walking, fell forward hitting his face against a parked car and fell to the ground impacting his left hip.  He sustained a scrap to his nose and forehead and started hurting in the left hip and is brought to the ED. In the ED, hemodynamically stable, breathing on room air. Labs with WC count 11.3, hemoglobin 15.1, platelet 219, BUN/creatinine 29/1.19, glucose level elevated to 189. CT scan of left hip showed an acute intra-articular left femoral neck fracture with transcervical and subcapital components and mild apex anterior angulation. CT scan of head, cervical spine and maxillofacial were also obtained. No acute intracranial abnormality. Negative for cervical spine fracture. Atrophy and chronic microvascular ischemic change in the white matter. Fracture of the nasal bone with mild displacement. No other facial fracture.  ED physician discussed the case with Orthopedics Dr. [facility] service consulted for admission and management.  Subjective: Patient was seen and examined this morning.   Lying on bed.  Feels weak.  Was able to get up with therapy yesterday.  Noted back to baseline level of strength. Expecting to be participate with therapy again today.  No appetite. No family at bedside  Assessment/Plan: Acute intra-articular left femoral neck fracture S/p left direct anterior THA - 12/1 Dr. Ninfa Linden PT eval  obtained.  Home with PT recommended.  However patient is still very weak.  Encourage appetite.  Encourage to use pain medicine when needed. 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  He has not started it yet.  He plans to start it once he gets home.  Nasal bone fracture Mild swelling noted.  No external injury.  Pain controlled.  No need of surgical intervention  Type 2 diabetes mellitus A1c 6.9 in 2022. PTA on Synjardy, Actos which are currently on hold Continue sliding scale insulin with Accu-Cheks Recent Labs  Lab 03/04/22 1123 03/04/22 1636 03/04/22 2100 03/05/22 0729 03/05/22 1140  GLUCAP 122* 162* 175* 123* 141*    CAD/CABG  HLD  PTA on metoprolol, Plavix, statin  Continue all..  Essential hypertension On Toprol 50 mg daily and ramipril 5 mg twice daily Continue both Continue monitor blood pressure.  Hydralazine IV as needed  Mobility: Needs PT eval after surgery  Goals of care   Code Status: Full Code   Diet: Diet Order             Diet regular Room service appropriate? Yes; Fluid consistency: Thin  Diet effective now                   Nutritional status:  Body mass index is 26.61 kg/m.       DVT prophylaxis: Lovenox subcu enoxaparin (LOVENOX) injection 40 mg Start: 03/03/22 2000 SCDs Start: 03/03/22  1743   Antimicrobials: None Fluid: Not on IV hydration Consultants: Orthopedics Family Communication: Wife not at bedside today  Status is: Inpatient  Continue in-hospital care because: s/p ORIF, POD 2.  Remains weak.  Home with PT recommended but unsafe for discharge today. Level of care: Telemetry   Dispo: The patient is from: Home              Anticipated d/c is to: Hoping to discharge home in 1 to 2 days              Patient currently is not  medically stable to d/c.   Difficult to place patient No     Infusions:   methocarbamol (ROBAXIN) IV     methocarbamol (ROBAXIN) IV Stopped (03/03/22 1722)    Scheduled Meds:  atorvastatin  80 mg Oral QHS   clopidogrel  75 mg Oral Daily   docusate sodium  100 mg Oral BID   empagliflozin  25 mg Oral Q lunch   And   metFORMIN  1,000 mg Oral Q lunch   enoxaparin (LOVENOX) injection  40 mg Subcutaneous Q24H   insulin aspart  0-5 Units Subcutaneous QHS   insulin aspart  0-9 Units Subcutaneous TID WC   metoprolol succinate  50 mg Oral Daily   pantoprazole  40 mg Oral Daily   pioglitazone  15 mg Oral Daily   ramipril  5 mg Oral BID   senna  1 tablet Oral BID    PRN meds: acetaminophen **OR** acetaminophen, acetaminophen, albuterol, bisacodyl, diphenhydrAMINE, guaiFENesin-dextromethorphan, hydrALAZINE, HYDROmorphone (DILAUDID) injection, menthol-cetylpyridinium **OR** phenol, methocarbamol (ROBAXIN) IV, methocarbamol **OR** methocarbamol (ROBAXIN) IV, metoCLOPramide **OR** metoCLOPramide (REGLAN) injection, morphine injection, ondansetron **OR** ondansetron (ZOFRAN) IV, oxyCODONE, oxyCODONE, oxyCODONE   Antimicrobials: Anti-infectives (From admission, onward)    Start     Dose/Rate Route Frequency Ordered Stop   03/03/22 2000  ceFAZolin (ANCEF) IVPB 1 g/50 mL premix        1 g 100 mL/hr over 30 Minutes Intravenous Every 6 hours 03/03/22 1742 03/04/22 0216   03/03/22 1330  ceFAZolin (ANCEF) IVPB 2g/100 mL premix        2 g 200 mL/hr over 30 Minutes Intravenous  Once 03/03/22 1328 03/03/22 1435   03/03/22 1329  ceFAZolin (ANCEF) 2-4 GM/100ML-% IVPB       Note to Pharmacy: Mardelle Matte R: cabinet override      03/03/22 1329 03/03/22 1443       Objective: Vitals:   03/04/22 2057 03/05/22 0547  BP: (!) 124/56 136/72  Pulse: 79 83  Resp: 18 18  Temp: 98.3 F (36.8 C) 98.3 F (36.8 C)  SpO2: 95% 96%    Intake/Output Summary (Last 24 hours) at 03/05/2022 1202 Last  data filed at 03/05/2022 1151 Gross per 24 hour  Intake 864.18 ml  Output 3050 ml  Net -2185.82 ml    Filed Weights   03/02/22 1915  Weight: 89 kg   Weight change:  Body mass index is 26.61 kg/m.   Physical Exam: General exam: Pleasant, elderly Caucasian male.  Pain partially controlled.  Looks weak today.  Encouraged to use pain medicines if needed. Skin: No rashes, lesions or ulcers. HEENT: Swollen nose because of nasal bone fracture, normocephalic, no obvious bleeding Lungs: Clear to auscultation bilaterally CVS: Regular rate and rhythm, no murmur.  GI/Abd soft, nontender, nondistended, bowel sound. CNS: Alert, awake, oriented x 3 Psychiatry: Mood appropriate Extremities: No pedal edema, no calf tenderness  Data Review: I have personally reviewed the  laboratory data and studies available.  F/u labs ordered Unresulted Labs (From admission, onward)    None       Signed, Terrilee Croak, MD [facility] 03/05/2022

## 2022-03-06 ENCOUNTER — Inpatient Hospital Stay (HOSPITAL_COMMUNITY): Payer: Medicare Other

## 2022-03-06 ENCOUNTER — Telehealth: Payer: Self-pay | Admitting: Neurology

## 2022-03-06 ENCOUNTER — Encounter (HOSPITAL_COMMUNITY): Payer: Self-pay | Admitting: Orthopaedic Surgery

## 2022-03-06 DIAGNOSIS — S72001A Fracture of unspecified part of neck of right femur, initial encounter for closed fracture: Secondary | ICD-10-CM | POA: Diagnosis not present

## 2022-03-06 LAB — GLUCOSE, CAPILLARY
Glucose-Capillary: 126 mg/dL — ABNORMAL HIGH (ref 70–99)
Glucose-Capillary: 186 mg/dL — ABNORMAL HIGH (ref 70–99)
Glucose-Capillary: 152 mg/dL — ABNORMAL HIGH (ref 70–99)
Glucose-Capillary: 171 mg/dL — ABNORMAL HIGH (ref 70–99)

## 2022-03-06 MED ORDER — OXYCODONE HCL 5 MG PO TABS: 5.0000 mg | ORAL_TABLET | Freq: Four times a day (QID) | ORAL | 0 refills | Status: DC | PRN

## 2022-03-06 NOTE — Telephone Encounter (Signed)
We did not talk about memory or cognitive issues. But the changes in his brain could affect both. I am pretty sure I gave her something to read last time she was here about chronic microvascular ischemic changes, if not I am going to print it and please mail to them both printing now. thanks

## 2022-03-06 NOTE — Progress Notes (Signed)
PROGRESS NOTE  Joel Gonzales.  DOB: 12-23-1945  PCP: Haywood Pao, MD NTI:[id number]  DOA: 03/02/2022  LOS: 3 days  Hospital Day: 5  Brief narrative: Joel Nguyen. is a 76 y.o. 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 Today, patient was brought into ED after an episode of fall. Patient was diagnosed with vascular parkinsonism 4 years ago at [facility].  He has trouble with his gait.  He sometimes has trouble stopping and tends to fall forward.  Today he was walking, fell forward hitting his face against a parked car and fell to the ground impacting his left hip.  He sustained a scrap to his nose and forehead and started hurting in the left hip and is brought to the ED. In the ED, hemodynamically stable, breathing on room air. Labs with WC count 11.3, hemoglobin 15.1, platelet 219, BUN/creatinine 29/1.19, glucose level elevated to 189. CT scan of left hip showed an acute intra-articular left femoral neck fracture with transcervical and subcapital components and mild apex anterior angulation. CT scan of head, cervical spine and maxillofacial were also obtained. No acute intracranial abnormality. Negative for cervical spine fracture. Atrophy and chronic microvascular ischemic change in the white matter. Fracture of the nasal bone with mild displacement. No other facial fracture.  ED physician discussed the case with Orthopedics Dr. [facility] service consulted for admission and management.  Subjective: Patient was seen and examined this morning.   Sitting up in recliner.  Not in distress.  Feels a little better than yesterday.  Not yet strong.  CIR recommended by PT.  Patient however still believes he can go home.  Wife at bedside.  Assessment/Plan: Acute intra-articular left femoral neck fracture S/p left direct anterior THA - 12/1 Dr. Ninfa Linden PT eval obtained.  Home with  PT recommended.  However patient is still very weak.  Encourage appetite.  Encourage to use pain medicine when needed. Per orthopedic Dr. Ninfa Linden, Plavix should suffice for DVT prophylaxis PT eval appreciated.  AIR recommended.  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  He has not started it yet.  He plans to start it once he gets home.  Nasal bone fracture Mild swelling noted.  No external injury.  Pain controlled.  No need of surgical intervention  Type 2 diabetes mellitus A1c 6.9 in 2022. PTA on Synjardy, Actos which are currently on hold Continue sliding scale insulin with Accu-Cheks Repeat labs tomorrow.  If renal function stable, resume home meds. Recent Labs  Lab 03/05/22 1140 03/05/22 1704 03/05/22 2122 03/06/22 0718 03/06/22 1147  GLUCAP 141* 111* 180* 126* 186*   CAD/CABG  HLD  PTA on metoprolol, Plavix, statin  Continue all..  Essential hypertension On Toprol 50 mg daily and ramipril 5 mg twice daily Continue both Continue monitor blood pressure.  Hydralazine IV as needed  Mobility: Needs PT eval after surgery  Goals of care   Code Status: Full Code   Diet: Diet Order             Diet regular Room service appropriate? Yes; Fluid consistency: Thin  Diet effective now                   Nutritional status:  Body mass index is 26.61 kg/m.  DVT prophylaxis: Lovenox subcu enoxaparin (LOVENOX) injection 40 mg Start: 03/03/22 2000 SCDs Start: 03/03/22 1743   Antimicrobials: None Fluid: Not on IV hydration Consultants: Orthopedics Family Communication: Wife not at bedside today  Status is: Inpatient  Continue in-hospital care because: s/p ORIF, POD 2.  Remains weak.  AIR recommended by PT  level of care: Telemetry   Dispo: The patient is from: Home              Anticipated d/c is to: Hoping to discharge home in  1 to 2 days              Patient currently is not medically stable to d/c.   Difficult to place patient No     Infusions:   methocarbamol (ROBAXIN) IV     methocarbamol (ROBAXIN) IV Stopped (03/03/22 1722)    Scheduled Meds:  atorvastatin  80 mg Oral QHS   clopidogrel  75 mg Oral Daily   docusate sodium  100 mg Oral BID   empagliflozin  25 mg Oral Q lunch   And   metFORMIN  1,000 mg Oral Q lunch   enoxaparin (LOVENOX) injection  40 mg Subcutaneous Q24H   insulin aspart  0-5 Units Subcutaneous QHS   insulin aspart  0-9 Units Subcutaneous TID WC   metoprolol succinate  50 mg Oral Daily   pantoprazole  40 mg Oral Daily   pioglitazone  15 mg Oral Daily   ramipril  5 mg Oral BID   senna  1 tablet Oral BID    PRN meds: acetaminophen **OR** acetaminophen, acetaminophen, albuterol, bisacodyl, diphenhydrAMINE, guaiFENesin-dextromethorphan, hydrALAZINE, HYDROmorphone (DILAUDID) injection, menthol-cetylpyridinium **OR** phenol, methocarbamol (ROBAXIN) IV, methocarbamol **OR** methocarbamol (ROBAXIN) IV, metoCLOPramide **OR** metoCLOPramide (REGLAN) injection, morphine injection, ondansetron **OR** ondansetron (ZOFRAN) IV, oxyCODONE, oxyCODONE, oxyCODONE   Antimicrobials: Anti-infectives (From admission, onward)    Start     Dose/Rate Route Frequency Ordered Stop   03/03/22 2000  ceFAZolin (ANCEF) IVPB 1 g/50 mL premix        1 g 100 mL/hr over 30 Minutes Intravenous Every 6 hours 03/03/22 1742 03/04/22 0216   03/03/22 1330  ceFAZolin (ANCEF) IVPB 2g/100 mL premix        2 g 200 mL/hr over 30 Minutes Intravenous  Once 03/03/22 1328 03/03/22 1435   03/03/22 1329  ceFAZolin (ANCEF) 2-4 GM/100ML-% IVPB       Note to Pharmacy: Mardelle Matte R: cabinet override      03/03/22 1329 03/03/22 1443       Objective: Vitals:   03/06/22 0418 03/06/22 1338  BP: (!) 166/69 133/73  Pulse: 77 72  Resp: 18 18  Temp: 98 F (36.7 C) 97.9 F (36.6 C)  SpO2: 96% 97%    Intake/Output  Summary (Last 24 hours) at 03/06/2022 1446 Last data filed at 03/06/2022 0900 Gross per 24 hour  Intake 240 ml  Output 1850 ml  Net -1610 ml   Filed Weights   03/02/22 1915  Weight: 89 kg   Weight change:  Body mass index is 26.61 kg/m.   Physical Exam: General exam: Pleasant, elderly Caucasian male.  Pain partially controlled.  Remains weak.  Encouraged to use pain medicines if needed. Skin: No rashes, lesions or ulcers. HEENT: Swollen nose because of nasal bone fracture, normocephalic, no obvious bleeding Lungs: Clear to auscultation bilaterally CVS: Regular rate and rhythm, no murmur.  GI/Abd soft, nontender, nondistended, bowel sound. CNS: Alert, awake, oriented x 3 Psychiatry: Mood appropriate Extremities: No pedal edema, no calf  tenderness  Data Review: I have personally reviewed the laboratory data and studies available.  F/u labs ordered Unresulted Labs (From admission, onward)     Start     Ordered   03/07/22 0500  CBC with Differential/Platelet  Tomorrow morning,   R        03/06/22 1445   03/07/22 8159  Basic metabolic panel  Tomorrow morning,   R        03/06/22 1445            Signed, Terrilee Croak, MD [facility] 03/06/2022

## 2022-03-06 NOTE — Telephone Encounter (Signed)
I printed something on small vessel disease let's have debra mail it to them and they can follow up after he is out of the hospital to discuss with me thanks

## 2022-03-06 NOTE — Progress Notes (Addendum)
Physical Therapy Treatment Patient Details Name: Joel Gonzales. MRN: [id number] DOB: Aug 15, 1945 Today's Date: 03/06/2022   History of Present Illness Royale Swamy. is a 76 y.o. 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 Was ambulating and felt himself getting faster and faster and couldn't stop himself and fell into his car and fell.  He suffered L intra-articular left femoral neck fracture and is s/p L THA anterior approach (03/03/22)    PT Comments    The patient reports that he has  posterior leaning when he gets up. Today, did not note posterior bias, using  RW throughout ambulation. Patient reports that he may be home alone when wife is working in their business. Recommend that patient have 24/7 initially at [facility] as patient is fall risk.   Patient  states that he has "Parkinsonism" and was to start medication  but has not since he fell.    Patient frequently taking 1 hand from Rw to wipe the hands(" I am sweating.") Patient does not stop but continues to ambulate, ( with a festinating  like  pattern .)  Patient may benefit from AIR due to Balance disorder and hip fracture. Continue PT  with  plan for Dc home.  Recommendations for follow up therapy are one component of a multi-disciplinary discharge planning process, led by the attending physician.  Recommendations may be updated based on patient status, additional functional criteria and insurance authorization.  Follow Up Recommendations  AIR     Assistance Recommended at Discharge Constant supervision, high fall risk.  Patient can return home with the following A little help with walking and/or transfers;Help with stairs or ramp for entrance;A little help with bathing/dressing/bathroom;Other (comment)   Equipment Recommendations  None recommended by PT    Recommendations for Other Services       Precautions / Restrictions  Precautions Precautions: Fall Restrictions LLE Weight Bearing: Weight bearing as tolerated     Mobility  Bed Mobility   Bed Mobility: Supine to Sit     Supine to sit: Min assist     General bed mobility comments: min assist at trunk to sit upright, posterior bias initially    Transfers Overall transfer level: Needs assistance Equipment used: Rolling walker (2 wheels) Transfers: Sit to/from Stand Sit to Stand: Min assist           General transfer comment: MIN A from bed with cues for technique and shifting weight forward, decreased control of descent to recliner.    Ambulation/Gait Ambulation/Gait assistance: Min assist Gait Distance (Feet): 100 Feet ,stopped to  wipe hands and continued 50' more. Cues to not let  go of the RW. Assistive device: Rolling walker (2 wheels) Gait Pattern/deviations: Step-through pattern Gait velocity: decreased     General Gait Details: Increased step length and cadence ,   Stairs             Wheelchair Mobility    Modified Rankin (Stroke Patients Only)       Balance Overall balance assessment: History of Falls, Needs assistance Sitting-balance support: No upper extremity supported, Bilateral upper extremity supported, Feet supported Sitting balance-Leahy Scale: Fair Sitting balance - Comments: initially posterior Postural control: Posterior lean Standing balance support: During functional activity, Bilateral upper extremity supported, Reliant on assistive device for balance Standing balance-Leahy Scale: Poor  Cognition Arousal/Alertness: Awake/alert Behavior During Therapy: WFL for tasks assessed/performed Overall Cognitive Status: Within Functional Limits for tasks assessed                                          Exercises      General Comments        Pertinent Vitals/Pain Pain Assessment Pain Score: 2  Pain Location: L hip with movement Pain  Descriptors / Indicators: Grimacing, Sore Pain Intervention(s): Monitored during session, RN gave pain meds during session    Home Living                          Prior Function            PT Goals (current goals can now be found in the care plan section) Progress towards PT goals: Progressing toward goals    Frequency    Min 5X/week      PT Plan Current plan remains appropriate    Co-evaluation              AM-PAC PT "6 Clicks" Mobility   Outcome Measure  Help needed turning from your back to your side while in a flat bed without using bedrails?: A Little Help needed moving from lying on your back to sitting on the side of a flat bed without using bedrails?: A Little Help needed moving to and from a bed to a chair (including a wheelchair)?: A Little Help needed standing up from a chair using your arms (e.g., wheelchair or bedside chair)?: A Little Help needed to walk in hospital room?: A Little Help needed climbing 3-5 steps with a railing? : A Lot 6 Click Score: 17    End of Session Equipment Utilized During Treatment: Gait belt Activity Tolerance: Patient tolerated treatment well Patient left: in chair;with call bell/phone within reach;with nursing/sitter in room (no alarm box in room, alerted  Network engineer) Nurse Communication: Mobility status PT Visit Diagnosis: Unsteadiness on feet (R26.81);Difficulty in walking, not elsewhere classified (R26.2);Pain Pain - Right/Left: Left Pain - part of body: Hip     Time: 1000-1035 PT Time Calculation (min) (ACUTE ONLY): 35 min  Charges:  $Gait Training: 23-37 mins                     [place] Office [phone] Weekend [phone]    Claretha Cooper 03/06/2022, 1:39 PM

## 2022-03-06 NOTE — Telephone Encounter (Signed)
I called pts wife.  She wanted to let us know, her question is about him being able to process information given to him (if he has problem following instructions or being obstinate, combative, stubborn).  Would his imaging studies have any information relating to this?  I see that we have seen for gait/ Parkinsonism not necessarily memory issues.  I relayed will be glad to check with Dr. Jaynee Eagles and get back with her.  She apreciated call back.  He is currently in hospital due to a fall, L hip fracture.

## 2022-03-06 NOTE — Progress Notes (Signed)
Patient ID: Joel Gonzales., male   DOB: 11-13-1945, 76 y.o.   MRN: [id number] Joel Gonzales is awake and alert.  His left operative hip is stable.  He is on Plavix chronically.  He can just continue his Plavix at home.  He does not need any other medications other than Plavix given his significant fall risk.  PT is working on his mobility.  When he is mobilizing better, he is safe for discharge from an orthopedic standpoint.  I will send in pain medications to his pharmacy.  I did change his dressing today and his hip incision looks good.  The dressing will stay on until he comes to see me in follow-up.

## 2022-03-06 NOTE — Telephone Encounter (Signed)
Pt is calling. Stated he wants to let Dr. Jaynee Eagles know that he fell and broke his hip on 03/03/2022 and is currently still in the hospital.

## 2022-03-07 DIAGNOSIS — S72001A Fracture of unspecified part of neck of right femur, initial encounter for closed fracture: Secondary | ICD-10-CM | POA: Diagnosis not present

## 2022-03-07 LAB — CBC WITH DIFFERENTIAL/PLATELET
Abs Immature Granulocytes: 0.08 10*3/uL — ABNORMAL HIGH (ref 0.00–0.07)
Basophils Absolute: 0.1 10*3/uL (ref 0.0–0.1)
Basophils Relative: 1 %
Eosinophils Absolute: 0.3 10*3/uL (ref 0.0–0.5)
Eosinophils Relative: 5 %
HCT: 33.5 % — ABNORMAL LOW (ref 39.0–52.0)
Hemoglobin: 11.2 g/dL — ABNORMAL LOW (ref 13.0–17.0)
Immature Granulocytes: 1 %
Lymphocytes Relative: 15 %
Lymphs Abs: 1.1 10*3/uL (ref 0.7–4.0)
MCH: 31.9 pg (ref 26.0–34.0)
MCHC: 33.4 g/dL (ref 30.0–36.0)
MCV: 95.4 fL (ref 80.0–100.0)
Monocytes Absolute: 0.7 10*3/uL (ref 0.1–1.0)
Monocytes Relative: 9 %
Neutro Abs: 5.1 10*3/uL (ref 1.7–7.7)
Neutrophils Relative %: 69 %
Platelets: 232 10*3/uL (ref 150–400)
RBC: 3.51 MIL/uL — ABNORMAL LOW (ref 4.22–5.81)
RDW: 13.4 % (ref 11.5–15.5)
WBC: 7.4 10*3/uL (ref 4.0–10.5)
nRBC: 0 % (ref 0.0–0.2)

## 2022-03-07 LAB — BASIC METABOLIC PANEL
Anion gap: 10 (ref 5–15)
BUN: 23 mg/dL (ref 8–23)
CO2: 24 mmol/L (ref 22–32)
Calcium: 8.7 mg/dL — ABNORMAL LOW (ref 8.9–10.3)
Chloride: 104 mmol/L (ref 98–111)
Creatinine, Ser: 1.07 mg/dL (ref 0.61–1.24)
GFR, Estimated: >60 mL/min (ref >60)
Glucose, Bld: 126 mg/dL — ABNORMAL HIGH (ref 70–99)
Potassium: 3.4 mmol/L — ABNORMAL LOW (ref 3.5–5.1)
Sodium: 138 mmol/L (ref 135–145)

## 2022-03-07 LAB — GLUCOSE, CAPILLARY
Glucose-Capillary: 128 mg/dL — ABNORMAL HIGH (ref 70–99)
Glucose-Capillary: 190 mg/dL — ABNORMAL HIGH (ref 70–99)
Glucose-Capillary: 132 mg/dL — ABNORMAL HIGH (ref 70–99)
Glucose-Capillary: 182 mg/dL — ABNORMAL HIGH (ref 70–99)

## 2022-03-07 MED ORDER — POTASSIUM CHLORIDE CRYS ER 20 MEQ PO TBCR
40.0000 meq | EXTENDED_RELEASE_TABLET | Freq: Once | ORAL | Status: AC
  Administered 2022-03-07: 40 meq via ORAL
  Filled 2022-03-07: qty 2

## 2022-03-07 MED ORDER — GUAIFENESIN ER 600 MG PO TB12
600.0000 mg | ORAL_TABLET | Freq: Two times a day (BID) | ORAL | Status: DC
  Administered 2022-03-07 – 2022-03-08 (×2): 600 mg via ORAL
  Filled 2022-03-07 (×3): qty 1

## 2022-03-07 NOTE — Progress Notes (Signed)
Physical Therapy Treatment Patient Details Name: Joel Gonzales. MRN: [id number] DOB: 23-Aug-1945 Today's Date: 03/07/2022   History of Present Illness Joel Gonzales. is a 76 y.o. 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 Was ambulating and felt himself getting faster and faster and couldn't stop himself and fell into his car and fell.  He suffered L intra-articular left femoral neck fracture and is s/p L THA anterior approach (03/03/22)    PT Comments    Pt requiring min assist today due to posterior bias.  Pt reports difficulty with this for the past year.  Pt educated on sit to stand transfers technique to assist with weight shifting and stability.  Pt ambulated in hallway and then returned to room and performed LE exercises in recliner.  CIR coordinator called during session and to call pt back.  Pt uncertain of d/c plans however encouraged AIR upon d/c for safety to improve balance, mobility, and strength prior to returning home.    Recommendations for follow up therapy are one component of a multi-disciplinary discharge planning process, led by the attending physician.  Recommendations may be updated based on patient status, additional functional criteria and insurance authorization.  Follow Up Recommendations  Acute inpatient rehab (3hours/day)     Assistance Recommended at Discharge Intermittent Supervision/Assistance  Patient can return home with the following A little help with walking and/or transfers;Help with stairs or ramp for entrance;A little help with bathing/dressing/bathroom;Other (comment)   Equipment Recommendations  None recommended by PT    Recommendations for Other Services       Precautions / Restrictions Precautions Precautions: Fall Restrictions Weight Bearing Restrictions: No LLE Weight Bearing: Weight bearing as tolerated     Mobility  Bed  Mobility Overal bed mobility: Needs Assistance Bed Mobility: Supine to Sit     Supine to sit: Min assist     General bed mobility comments: min assist at trunk to sit upright, posterior bias initially    Transfers Overall transfer level: Needs assistance Equipment used: Rolling walker (2 wheels) Transfers: Sit to/from Stand Sit to Stand: Min assist           General transfer comment: assist to rise and stabilize, cues for hand placement and forward weight shift; pt presents with posterior bias and LEs leaning against bed to self stabilize, performed sit to stand x3 for technique and improved with last sit to stand to min/guard    Ambulation/Gait Ambulation/Gait assistance: Min assist Gait Distance (Feet): 160 Feet Assistive device: Rolling walker (2 wheels) Gait Pattern/deviations: Step-through pattern, Decreased stride length Gait velocity: decreased     General Gait Details: verbal cues for use of RW, pt tends to remove hands from walker to wipe (reports hands "sweaty")   Stairs             Wheelchair Mobility    Modified Rankin (Stroke Patients Only)       Balance Overall balance assessment: History of Falls, Needs assistance       Postural control: Posterior lean Standing balance support: During functional activity, Bilateral upper extremity supported, Reliant on assistive device for balance Standing balance-Leahy Scale: Poor Standing balance comment: significant posterior bias with standing today, required assist, also cues for upper body use to assist with stabilizing (was not pushing down on RW)  Cognition Arousal/Alertness: Awake/alert Behavior During Therapy: WFL for tasks assessed/performed Overall Cognitive Status: Within Functional Limits for tasks assessed                                          Exercises General Exercises - Lower Extremity Ankle Circles/Pumps: AROM, Both, 10  reps Quad Sets: AROM, Left, 10 reps Long Arc Quad: AROM, Left, 10 reps, Seated Heel Slides: AAROM, Left, 10 reps Hip ABduction/ADduction: AAROM, Left, 10 reps Hip Flexion/Marching: AROM, Seated, Left, 10 reps    General Comments        Pertinent Vitals/Pain Pain Assessment Pain Assessment: 0-10 Pain Score: 2  Pain Location: L hip with movement Pain Descriptors / Indicators: Grimacing, Sore Pain Intervention(s): Repositioned, Monitored during session    Home Living                          Prior Function            PT Goals (current goals can now be found in the care plan section) Progress towards PT goals: Progressing toward goals    Frequency    Min 5X/week      PT Plan Current plan remains appropriate    Co-evaluation              AM-PAC PT "6 Clicks" Mobility   Outcome Measure  Help needed turning from your back to your side while in a flat bed without using bedrails?: A Little Help needed moving from lying on your back to sitting on the side of a flat bed without using bedrails?: A Little Help needed moving to and from a bed to a chair (including a wheelchair)?: A Little Help needed standing up from a chair using your arms (e.g., wheelchair or bedside chair)?: A Little Help needed to walk in hospital room?: A Little Help needed climbing 3-5 steps with a railing? : A Lot 6 Click Score: 17    End of Session Equipment Utilized During Treatment: Gait belt Activity Tolerance: Patient tolerated treatment well Patient left: in chair;with call bell/phone within reach (on chair alarm pad, no box in room and secretary notified (also this was documented yesterday and no box yet in room for today))   PT Visit Diagnosis: Unsteadiness on feet (R26.81);Difficulty in walking, not elsewhere classified (R26.2);Pain Pain - Right/Left: Left Pain - part of body: Hip     Time: 7482-7078 PT Time Calculation (min) (ACUTE ONLY): 30 min  Charges:  $Gait  Training: 8-22 mins $Therapeutic Exercise: 8-22 mins                     Jannette Spanner PT, DPT Physical Therapist Acute Rehabilitation Services Preferred contact method: Secure Chat Weekend Pager Only: [phone] Office: [place] 03/07/2022, 12:18 PM

## 2022-03-07 NOTE — Care Management Important Message (Signed)
Important Message  Patient Details IM Letter given. Name: Joel Gonzales. MRN: [id number] Date of Birth: 09-13-1945   Medicare Important Message Given:  Yes     Kerin Salen 03/07/2022, 9:25 AM

## 2022-03-07 NOTE — Telephone Encounter (Signed)
Mailed to pt and wife

## 2022-03-07 NOTE — Progress Notes (Signed)
Inpatient Rehab Admissions Coordinator:    I spoke with Pt. Regarding potential CIR admit. He Is interested. I have reached out to rehab MD to have him review for medical necessity and will update once I receive a response.   Clemens Catholic, [place], [place] Admissions Coordinator  [phone] ([place]) [phone] (office)

## 2022-03-07 NOTE — Progress Notes (Signed)
PROGRESS NOTE  Joel Gonzales.  DOB: 03/28/1946  PCP: Haywood Pao, MD JQZ:[id number]  DOA: 03/02/2022  LOS: 4 days  Hospital Day: 6  Brief narrative: Joel Gonzales. is a 76 y.o. 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 Today, patient was brought into ED after an episode of fall. Patient was diagnosed with vascular parkinsonism 4 years ago at [facility].  He has trouble with his gait.  He sometimes has trouble stopping and tends to fall forward.  Today he was walking, fell forward hitting his face against a parked car and fell to the ground impacting his left hip.  He sustained a scrap to his nose and forehead and started hurting in the left hip and is brought to the ED. In the ED, hemodynamically stable, breathing on room air. Labs with WC count 11.3, hemoglobin 15.1, platelet 219, BUN/creatinine 29/1.19, glucose level elevated to 189. CT scan of left hip showed an acute intra-articular left femoral neck fracture with transcervical and subcapital components and mild apex anterior angulation. CT scan of head, cervical spine and maxillofacial were also obtained. No acute intracranial abnormality. Negative for cervical spine fracture. Atrophy and chronic microvascular ischemic change in the white matter. Fracture of the nasal bone with mild displacement. No other facial fracture.  ED physician discussed the case with Orthopedics Dr. [facility] service consulted for admission and management.  Subjective: Patient was seen and examined this morning.   Recliner.  Feels weak.  He has realized that he would be able to go home and will benefit from inpatient rehab.  Assessment/Plan: Acute intra-articular left femoral neck fracture S/p left direct anterior THA - 12/1 Dr. Ninfa Linden PT eval obtained.  Home with PT recommended.  However patient is still very weak.  Encourage appetite.   Encourage to use pain medicine when needed. Per orthopedic Dr. Ninfa Linden, Plavix should suffice for DVT prophylaxis PT eval appreciated.  AIR recommended.  Vascular parkinsonism Leading to gait impairment and falls Patient apparently saw neurologist at [facility] neurologist yesterday only 11/29 and was prescribed  Sinemet.  He has not started it yet.  He plans to start it once he gets home.  Nasal bone fracture Mild swelling noted.  No external injury.  Pain controlled.  No need of surgical intervention  Type 2 diabetes mellitus A1c 6.9 in 2022. Continue Synjardy, Actos as before.  Continue sliding scale insulin. Recent Labs  Lab 03/06/22 1147 03/06/22 1657 03/06/22 2133 03/07/22 0745 03/07/22 1140  GLUCAP 186* 152* 171* 128* 190*   CAD/CABG  HLD  PTA on metoprolol, Plavix, statin  Continue all..  Essential hypertension On Toprol 50 mg daily and ramipril 5 mg twice daily Continue both Continue monitor blood pressure.  Hydralazine IV as needed  Cough Chest x-ray 12/4 low lung volume.  No evidence of pneumonia.  Start Mucinex scheduled.  Mobility: PT eval obtained.  CIR recommended  Goals of care   Code Status: Full Code   Diet: Diet Order             Diet regular Room service appropriate? Yes; Fluid consistency: Thin  Diet effective now                   Nutritional status:  Body mass index is 26.61 kg/m.       DVT prophylaxis: Lovenox subcu enoxaparin (LOVENOX) injection 40 mg Start: 03/03/22 2000  SCDs Start: 03/03/22 1743   Antimicrobials: None Fluid: Not on IV hydration Consultants: Orthopedics Family Communication: Wife not at bedside today  Status is: Inpatient  Continue in-hospital care because: s/p ORIF, POD 2.  Remains weak.  AIR recommended by PT  level of care: Telemetry   Dispo: The patient is from: Home              Anticipated d/c is to: Hoping to discharge home in 1 to 2 days              Patient currently is not medically stable  to d/c.   Difficult to place patient No     Infusions:   methocarbamol (ROBAXIN) IV     methocarbamol (ROBAXIN) IV Stopped (03/03/22 1722)    Scheduled Meds:  atorvastatin  80 mg Oral QHS   clopidogrel  75 mg Oral Daily   docusate sodium  100 mg Oral BID   empagliflozin  25 mg Oral Q lunch   And   metFORMIN  1,000 mg Oral Q lunch   enoxaparin (LOVENOX) injection  40 mg Subcutaneous Q24H   guaiFENesin  600 mg Oral BID   insulin aspart  0-5 Units Subcutaneous QHS   insulin aspart  0-9 Units Subcutaneous TID WC   metoprolol succinate  50 mg Oral Daily   pantoprazole  40 mg Oral Daily   pioglitazone  15 mg Oral Daily   ramipril  5 mg Oral BID   senna  1 tablet Oral BID    PRN meds: acetaminophen **OR** acetaminophen, acetaminophen, albuterol, bisacodyl, diphenhydrAMINE, hydrALAZINE, HYDROmorphone (DILAUDID) injection, menthol-cetylpyridinium **OR** phenol, methocarbamol (ROBAXIN) IV, methocarbamol **OR** methocarbamol (ROBAXIN) IV, metoCLOPramide **OR** metoCLOPramide (REGLAN) injection, morphine injection, ondansetron **OR** ondansetron (ZOFRAN) IV, oxyCODONE, oxyCODONE, oxyCODONE   Antimicrobials: Anti-infectives (From admission, onward)    Start     Dose/Rate Route Frequency Ordered Stop   03/03/22 2000  ceFAZolin (ANCEF) IVPB 1 g/50 mL premix        1 g 100 mL/hr over 30 Minutes Intravenous Every 6 hours 03/03/22 1742 03/04/22 0216   03/03/22 1330  ceFAZolin (ANCEF) IVPB 2g/100 mL premix        2 g 200 mL/hr over 30 Minutes Intravenous  Once 03/03/22 1328 03/03/22 1435   03/03/22 1329  ceFAZolin (ANCEF) 2-4 GM/100ML-% IVPB       Note to Pharmacy: Mardelle Matte R: cabinet override      03/03/22 1329 03/03/22 1443       Objective: Vitals:   03/07/22 0419 03/07/22 1302  BP: (!) 157/87 126/73  Pulse: 77 79  Resp: 18 20  Temp: 97.9 F (36.6 C) 97.9 F (36.6 C)  SpO2: 95% 97%    Intake/Output Summary (Last 24 hours) at 03/07/2022 1523 Last data filed at  03/07/2022 1235 Gross per 24 hour  Intake 515 ml  Output 1576 ml  Net -1061 ml   Filed Weights   03/02/22 1915  Weight: 89 kg   Weight change:  Body mass index is 26.61 kg/m.   Physical Exam: General exam: Pleasant, elderly Caucasian male.  Pain partially controlled.  Remains weak.  Encouraged to use pain medicines if needed. Skin: No rashes, lesions or ulcers. HEENT: Swollen nose because of nasal bone fracture, normocephalic, no obvious bleeding Lungs: Clear to auscultation bilaterally.  Coughs on deep breathing CVS: Regular rate and rhythm, no murmur.  GI/Abd soft, nontender, nondistended, bowel sound. CNS: Alert, awake, oriented x 3 Psychiatry: Mood appropriate Extremities: No pedal edema, no calf  tenderness  Data Review: I have personally reviewed the laboratory data and studies available.  F/u labs ordered Unresulted Labs (From admission, onward)    None       Signed, Terrilee Croak, MD [facility] 03/07/2022

## 2022-03-07 NOTE — Progress Notes (Signed)
Inpatient Rehab Admissions Coordinator:    Discussed case with rehab MD who feels Pt. Is a good candidate for CIR. I will pursue for admit. Requested OT eval to assess needs with ADLs.  Clemens Catholic, [place], [place] Admissions Coordinator  [phone] ([place]) [phone] (office)

## 2022-03-07 NOTE — Telephone Encounter (Signed)
Called pt's wife and let her know the literature on small vessel disease was mailed to her today. Since Dr Jaynee Eagles hasn't discussed memory with them, she has offered to schedule an appt after patient discharged from hospital. Wife was very appreciative. She did get the literature last time and read it but wants to discuss "where he is at" in terms of stages, etc. She states he is supposed to be discharged today. She will call back to schedule an appointment.

## 2022-03-07 NOTE — Telephone Encounter (Signed)
Small vessel disease info mailed to pt and wife.

## 2022-03-08 ENCOUNTER — Other Ambulatory Visit: Payer: Self-pay

## 2022-03-08 ENCOUNTER — Encounter (HOSPITAL_COMMUNITY): Payer: Self-pay | Admitting: Physical Medicine and Rehabilitation

## 2022-03-08 ENCOUNTER — Inpatient Hospital Stay (HOSPITAL_COMMUNITY)
Admission: RE | Admit: 2022-03-08 | Discharge: 2022-03-15 | DRG: 560 | Disposition: A | Discharge status: Home Health | Payer: Medicare Other | Source: Other Acute Inpatient Hospital | Attending: Physical Medicine and Rehabilitation | Admitting: Physical Medicine and Rehabilitation

## 2022-03-08 DIAGNOSIS — D62 Acute posthemorrhagic anemia: Secondary | ICD-10-CM | POA: Diagnosis present

## 2022-03-08 DIAGNOSIS — Z7984 Long term (current) use of oral hypoglycemic drugs: Secondary | ICD-10-CM

## 2022-03-08 DIAGNOSIS — Z96643 Presence of artificial hip joint, bilateral: Secondary | ICD-10-CM | POA: Diagnosis present

## 2022-03-08 DIAGNOSIS — E785 Hyperlipidemia, unspecified: Secondary | ICD-10-CM | POA: Diagnosis present

## 2022-03-08 DIAGNOSIS — S72002G Fracture of unspecified part of neck of left femur, subsequent encounter for closed fracture with delayed healing: Secondary | ICD-10-CM | POA: Diagnosis not present

## 2022-03-08 DIAGNOSIS — R269 Unspecified abnormalities of gait and mobility: Secondary | ICD-10-CM | POA: Diagnosis present

## 2022-03-08 DIAGNOSIS — E876 Hypokalemia: Secondary | ICD-10-CM | POA: Diagnosis present

## 2022-03-08 DIAGNOSIS — Z7983 Long term (current) use of bisphosphonates: Secondary | ICD-10-CM

## 2022-03-08 DIAGNOSIS — E663 Overweight: Secondary | ICD-10-CM | POA: Diagnosis present

## 2022-03-08 DIAGNOSIS — S72001D Fracture of unspecified part of neck of right femur, subsequent encounter for closed fracture with routine healing: Secondary | ICD-10-CM

## 2022-03-08 DIAGNOSIS — Z951 Presence of aortocoronary bypass graft: Secondary | ICD-10-CM | POA: Diagnosis not present

## 2022-03-08 DIAGNOSIS — E1129 Type 2 diabetes mellitus with other diabetic kidney complication: Secondary | ICD-10-CM

## 2022-03-08 DIAGNOSIS — I999 Unspecified disorder of circulatory system: Secondary | ICD-10-CM

## 2022-03-08 DIAGNOSIS — Z6825 Body mass index (BMI) 25.0-25.9, adult: Secondary | ICD-10-CM | POA: Diagnosis not present

## 2022-03-08 DIAGNOSIS — S72142A Displaced intertrochanteric fracture of left femur, initial encounter for closed fracture: Secondary | ICD-10-CM | POA: Diagnosis not present

## 2022-03-08 DIAGNOSIS — E119 Type 2 diabetes mellitus without complications: Secondary | ICD-10-CM | POA: Diagnosis present

## 2022-03-08 DIAGNOSIS — Z8673 Personal history of transient ischemic attack (TIA), and cerebral infarction without residual deficits: Secondary | ICD-10-CM

## 2022-03-08 DIAGNOSIS — Z7902 Long term (current) use of antithrombotics/antiplatelets: Secondary | ICD-10-CM

## 2022-03-08 DIAGNOSIS — Z79899 Other long term (current) drug therapy: Secondary | ICD-10-CM

## 2022-03-08 DIAGNOSIS — S72001A Fracture of unspecified part of neck of right femur, initial encounter for closed fracture: Secondary | ICD-10-CM | POA: Diagnosis not present

## 2022-03-08 DIAGNOSIS — Z1152 Encounter for screening for COVID-19: Secondary | ICD-10-CM

## 2022-03-08 DIAGNOSIS — G4700 Insomnia, unspecified: Secondary | ICD-10-CM | POA: Diagnosis present

## 2022-03-08 DIAGNOSIS — S72009A Fracture of unspecified part of neck of unspecified femur, initial encounter for closed fracture: Secondary | ICD-10-CM | POA: Diagnosis present

## 2022-03-08 DIAGNOSIS — S72002D Fracture of unspecified part of neck of left femur, subsequent encounter for closed fracture with routine healing: Principal | ICD-10-CM

## 2022-03-08 DIAGNOSIS — G214 Vascular parkinsonism: Secondary | ICD-10-CM | POA: Diagnosis present

## 2022-03-08 DIAGNOSIS — Z8249 Family history of ischemic heart disease and other diseases of the circulatory system: Secondary | ICD-10-CM | POA: Diagnosis not present

## 2022-03-08 DIAGNOSIS — I10 Essential (primary) hypertension: Secondary | ICD-10-CM | POA: Diagnosis present

## 2022-03-08 DIAGNOSIS — I4510 Unspecified right bundle-branch block: Secondary | ICD-10-CM | POA: Diagnosis present

## 2022-03-08 DIAGNOSIS — I2510 Atherosclerotic heart disease of native coronary artery without angina pectoris: Secondary | ICD-10-CM | POA: Diagnosis present

## 2022-03-08 LAB — GLUCOSE, CAPILLARY
Glucose-Capillary: 130 mg/dL — ABNORMAL HIGH (ref 70–99)
Glucose-Capillary: 163 mg/dL — ABNORMAL HIGH (ref 70–99)

## 2022-03-08 MED ORDER — ALBUTEROL SULFATE (2.5 MG/3ML) 0.083% IN NEBU: 2.5000 mg | INHALATION_SOLUTION | Freq: Four times a day (QID) | RESPIRATORY_TRACT | 12 refills | Status: DC | PRN

## 2022-03-08 MED ORDER — PROCHLORPERAZINE EDISYLATE 10 MG/2ML IJ SOLN: 5.0000 mg | Freq: Four times a day (QID) | INTRAMUSCULAR | Status: DC | PRN

## 2022-03-08 MED ORDER — METHOCARBAMOL 500 MG PO TABS
500.0000 mg | ORAL_TABLET | Freq: Four times a day (QID) | ORAL | Status: DC
  Administered 2022-03-08 – 2022-03-15 (×25): 500 mg via ORAL
  Filled 2022-03-08 (×25): qty 1

## 2022-03-08 MED ORDER — TRAZODONE HCL 50 MG PO TABS
25.0000 mg | ORAL_TABLET | Freq: Every evening | ORAL | Status: DC | PRN
  Filled 2022-03-08: qty 1

## 2022-03-08 MED ORDER — SENNA 8.6 MG PO TABS: 1.0000 | ORAL_TABLET | Freq: Two times a day (BID) | ORAL | 0 refills | Status: DC

## 2022-03-08 MED ORDER — ALUM & MAG HYDROXIDE-SIMETH 200-200-20 MG/5ML PO SUSP: 30.0000 mL | ORAL | Status: DC | PRN

## 2022-03-08 MED ORDER — METOPROLOL SUCCINATE ER 50 MG PO TB24
50.0000 mg | ORAL_TABLET | Freq: Every day | ORAL | Status: DC
  Administered 2022-03-09 – 2022-03-15 (×7): 50 mg via ORAL
  Filled 2022-03-08 (×7): qty 1

## 2022-03-08 MED ORDER — SENNOSIDES-DOCUSATE SODIUM 8.6-50 MG PO TABS
2.0000 | ORAL_TABLET | Freq: Every day | ORAL | Status: DC
  Administered 2022-03-11 – 2022-03-13 (×3): 2 via ORAL
  Filled 2022-03-08 (×5): qty 2

## 2022-03-08 MED ORDER — INSULIN ASPART 100 UNIT/ML IJ SOLN
0.0000 [IU] | Freq: Three times a day (TID) | INTRAMUSCULAR | Status: DC
  Administered 2022-03-09 (×2): 2 [IU] via SUBCUTANEOUS
  Administered 2022-03-09: 3 [IU] via SUBCUTANEOUS
  Administered 2022-03-10: 2 [IU] via SUBCUTANEOUS
  Administered 2022-03-11: 1 [IU] via SUBCUTANEOUS
  Administered 2022-03-11 – 2022-03-12 (×2): 2 [IU] via SUBCUTANEOUS
  Administered 2022-03-12 (×2): 1 [IU] via SUBCUTANEOUS
  Administered 2022-03-13 (×2): 2 [IU] via SUBCUTANEOUS
  Administered 2022-03-14: 1 [IU] via SUBCUTANEOUS
  Administered 2022-03-14: 2 [IU] via SUBCUTANEOUS

## 2022-03-08 MED ORDER — RAMIPRIL 5 MG PO CAPS
5.0000 mg | ORAL_CAPSULE | Freq: Two times a day (BID) | ORAL | Status: DC
  Administered 2022-03-08 – 2022-03-13 (×10): 5 mg via ORAL
  Filled 2022-03-08 (×12): qty 1

## 2022-03-08 MED ORDER — PHENOL 1.4 % MT LIQD
1.0000 | OROMUCOSAL | Status: DC | PRN
  Filled 2022-03-08: qty 177

## 2022-03-08 MED ORDER — ENOXAPARIN SODIUM 40 MG/0.4ML IJ SOSY
40.0000 mg | PREFILLED_SYRINGE | INTRAMUSCULAR | Status: DC
  Administered 2022-03-08 – 2022-03-14 (×7): 40 mg via SUBCUTANEOUS
  Filled 2022-03-08 (×7): qty 0.4

## 2022-03-08 MED ORDER — INSULIN ASPART 100 UNIT/ML IJ SOLN: 0.0000 [IU] | Freq: Three times a day (TID) | INTRAMUSCULAR | Status: DC

## 2022-03-08 MED ORDER — INSULIN ASPART 100 UNIT/ML IJ SOLN: 0.0000 [IU] | Freq: Every day | INTRAMUSCULAR | Status: DC

## 2022-03-08 MED ORDER — FLEET ENEMA 7-19 GM/118ML RE ENEM: 1.0000 | ENEMA | Freq: Once | RECTAL | Status: DC | PRN

## 2022-03-08 MED ORDER — MENTHOL 3 MG MT LOZG: 1.0000 | LOZENGE | OROMUCOSAL | Status: DC | PRN

## 2022-03-08 MED ORDER — METFORMIN HCL ER 500 MG PO TB24
1000.0000 mg | ORAL_TABLET | Freq: Every day | ORAL | Status: DC
  Administered 2022-03-10 – 2022-03-14 (×5): 1000 mg via ORAL
  Filled 2022-03-08 (×7): qty 2

## 2022-03-08 MED ORDER — ALBUTEROL SULFATE (2.5 MG/3ML) 0.083% IN NEBU
2.5000 mg | INHALATION_SOLUTION | Freq: Four times a day (QID) | RESPIRATORY_TRACT | Status: DC | PRN
  Filled 2022-03-08: qty 3

## 2022-03-08 MED ORDER — ACETAMINOPHEN 325 MG PO TABS
325.0000 mg | ORAL_TABLET | ORAL | Status: DC | PRN
  Administered 2022-03-08 – 2022-03-14 (×7): 650 mg via ORAL
  Filled 2022-03-08 (×7): qty 2

## 2022-03-08 MED ORDER — GUAIFENESIN-DM 100-10 MG/5ML PO SYRP: 5.0000 mL | ORAL_SOLUTION | Freq: Four times a day (QID) | ORAL | Status: DC | PRN

## 2022-03-08 MED ORDER — GUAIFENESIN ER 600 MG PO TB12: 600.0000 mg | ORAL_TABLET | Freq: Two times a day (BID) | ORAL | Status: DC

## 2022-03-08 MED ORDER — CLOPIDOGREL BISULFATE 75 MG PO TABS
75.0000 mg | ORAL_TABLET | Freq: Every day | ORAL | Status: DC
  Administered 2022-03-09 – 2022-03-15 (×7): 75 mg via ORAL
  Filled 2022-03-08 (×7): qty 1

## 2022-03-08 MED ORDER — PROCHLORPERAZINE MALEATE 5 MG PO TABS: 5.0000 mg | ORAL_TABLET | Freq: Four times a day (QID) | ORAL | Status: DC | PRN

## 2022-03-08 MED ORDER — EMPAGLIFLOZIN 25 MG PO TABS
25.0000 mg | ORAL_TABLET | Freq: Every day | ORAL | Status: DC
  Administered 2022-03-10 – 2022-03-14 (×5): 25 mg via ORAL
  Filled 2022-03-08 (×7): qty 1

## 2022-03-08 MED ORDER — PROCHLORPERAZINE 25 MG RE SUPP
12.5000 mg | Freq: Four times a day (QID) | RECTAL | Status: DC | PRN
  Filled 2022-03-08: qty 1

## 2022-03-08 MED ORDER — METHOCARBAMOL 500 MG PO TABS: 750.0000 mg | ORAL_TABLET | Freq: Four times a day (QID) | ORAL | Status: DC | PRN

## 2022-03-08 MED ORDER — PIOGLITAZONE HCL 15 MG PO TABS
15.0000 mg | ORAL_TABLET | Freq: Every day | ORAL | Status: DC
  Administered 2022-03-09 – 2022-03-15 (×7): 15 mg via ORAL
  Filled 2022-03-08 (×8): qty 1

## 2022-03-08 MED ORDER — DOCUSATE SODIUM 100 MG PO CAPS: 100.0000 mg | ORAL_CAPSULE | Freq: Two times a day (BID) | ORAL | 0 refills | Status: DC

## 2022-03-08 MED ORDER — BISACODYL 10 MG RE SUPP: 10.0000 mg | Freq: Every day | RECTAL | Status: DC | PRN

## 2022-03-08 MED ORDER — POLYETHYLENE GLYCOL 3350 17 G PO PACK: 17.0000 g | PACK | Freq: Every day | ORAL | Status: DC | PRN

## 2022-03-08 MED ORDER — PANTOPRAZOLE SODIUM 40 MG PO TBEC
40.0000 mg | DELAYED_RELEASE_TABLET | Freq: Every day | ORAL | Status: DC
  Administered 2022-03-09 – 2022-03-15 (×7): 40 mg via ORAL
  Filled 2022-03-08 (×7): qty 1

## 2022-03-08 MED ORDER — DIPHENHYDRAMINE HCL 12.5 MG/5ML PO ELIX: 12.5000 mg | ORAL_SOLUTION | Freq: Four times a day (QID) | ORAL | Status: DC | PRN

## 2022-03-08 MED ORDER — BISACODYL 10 MG RE SUPP: 10.0000 mg | Freq: Every day | RECTAL | 0 refills | Status: DC | PRN

## 2022-03-08 MED ORDER — ATORVASTATIN CALCIUM 80 MG PO TABS
80.0000 mg | ORAL_TABLET | Freq: Every day | ORAL | Status: DC
  Administered 2022-03-08 – 2022-03-14 (×7): 80 mg via ORAL
  Filled 2022-03-08 (×7): qty 1

## 2022-03-08 MED ORDER — GUAIFENESIN ER 600 MG PO TB12
600.0000 mg | ORAL_TABLET | Freq: Two times a day (BID) | ORAL | Status: DC
  Administered 2022-03-08 – 2022-03-15 (×14): 600 mg via ORAL
  Filled 2022-03-08 (×14): qty 1

## 2022-03-08 MED ORDER — OXYCODONE HCL 5 MG PO TABS
5.0000 mg | ORAL_TABLET | ORAL | Status: DC | PRN
  Filled 2022-03-08: qty 1

## 2022-03-08 MED ORDER — METHOCARBAMOL 500 MG PO TABS: 500.0000 mg | ORAL_TABLET | ORAL | Status: DC | PRN

## 2022-03-08 NOTE — Telephone Encounter (Signed)
If she really wants to [facility] where he is at I woud recommend referring to formal memory testing with Dr. Alphonzo Severance. It's hard to know without the testing. If they are agreeable I would refer to Dr. Nicole Kindred and have them follow up with me AFTER the testing is done and they review the results with him (always make sure they review the results with the neurocognitive tester after the test is completed, may take up to 3 appointments thanks)

## 2022-03-08 NOTE — Evaluation (Signed)
Occupational Therapy Evaluation Patient Details Name: Joel Gonzales. MRN: [id number] DOB: 1945/11/14 Today's Date: 03/08/2022   History of Present Illness Joel Gonzales. is a 76 y.o. male who was ambulating and felt himself getting faster and faster and couldn't stop himself and fell into his car and fell. fall resulted in LLE pain. imaging revealed L intra-articular left femoral neck fracture and is s/p L THA anterior approach (03/03/22). 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   Clinical Impression   Patient is a 76 year old male who was admitted for above. Patient was living at home with wife while still working at [employer] they run. Patient was noted to need min A with consistent cues for proper positioning with posterior leaning in standing and increased pain in operative area of LLE. Patient had decreased carryover of education,decreased functional activity tolerance, decreased standing balance, decreased safety awareness, and decreased knowledge of AE impacting participation in ADLs. Patient would continue to benefit from skilled OT services at this time while admitted and after d/c to address noted deficits in order to improve overall safety and independence in ADLs.       Recommendations for follow up therapy are one component of a multi-disciplinary discharge planning process, led by the attending physician.  Recommendations may be updated based on patient status, additional functional criteria and insurance authorization.   Follow Up Recommendations  Acute inpatient rehab (3hours/day)     Assistance Recommended at Discharge Frequent or constant Supervision/Assistance  Patient can return home with the following A little help with walking and/or transfers;A lot of help with bathing/dressing/bathroom;Assistance with cooking/housework;Direct supervision/assist for medications management;Direct supervision/assist for financial management;Help with stairs or  ramp for entrance;Assist for transportation    Functional Status Assessment  Patient has had a recent decline in their functional status and demonstrates the ability to make significant improvements in function in a reasonable and predictable amount of time.  Equipment Recommendations  Other (comment) (defer to next venue)    Recommendations for Other Services       Precautions / Restrictions Precautions Precautions: Fall Restrictions Weight Bearing Restrictions: No LLE Weight Bearing: Weight bearing as tolerated      Mobility Bed Mobility Overal bed mobility: Needs Assistance Bed Mobility: Supine to Sit     Supine to sit: Min assist     General bed mobility comments: with education on using gait belt as leg lifter to advance to EOB.    Transfers Overall transfer level: Needs assistance Equipment used: Rolling walker (2 wheels) Transfers: Sit to/from Stand Sit to Stand: Min assist           General transfer comment: needs cues for hand placement and weight shifting forwards keeping toes on floor.      Balance Overall balance assessment: History of Falls, Needs assistance Sitting-balance support: No upper extremity supported, Bilateral upper extremity supported, Feet supported Sitting balance-Leahy Scale: Fair     Standing balance support: During functional activity, Bilateral upper extremity supported, Reliant on assistive device for balance Standing balance-Leahy Scale: Poor Standing balance comment: posterior leaning in standing at times cues needed for proper positioning                           ADL either performed or assessed with clinical judgement   ADL Overall ADL's : Needs assistance/impaired Eating/Feeding: Set up;Sitting   Grooming: Minimal assistance;Standing   Upper Body Bathing: Min guard;Sitting  Lower Body Bathing: Moderate assistance;Sitting/lateral leans   Upper Body Dressing : Min guard;Sitting   Lower Body Dressing:  Moderate assistance;Sitting/lateral leans   Toilet Transfer: Minimal assistance;Ambulation;Rolling walker (2 wheels) Toilet Transfer Details (indicate cue type and reason): with cues for keeping toes on the floor and sequencing of sit to stand and stand to sit for safety and preventing posterior leaning Toileting- Clothing Manipulation and Hygiene: Minimal assistance;Sit to/from stand Toileting - Clothing Manipulation Details (indicate cue type and reason): standing with RW with one UE support needed to maintain balance.             Vision Baseline Vision/History: 1 Wears glasses Patient Visual Report: No change from baseline       Perception     Praxis      Pertinent Vitals/Pain Pain Assessment Pain Assessment: 0-10 Pain Score: 2  Pain Location: L hip with movement Pain Descriptors / Indicators: Grimacing, Sore Pain Intervention(s): Limited activity within patient's tolerance, Monitored during session, Premedicated before session     Hand Dominance Right   Extremity/Trunk Assessment Upper Extremity Assessment Upper Extremity Assessment: Overall WFL for tasks assessed   Lower Extremity Assessment Lower Extremity Assessment: Defer to PT evaluation   Cervical / Trunk Assessment Cervical / Trunk Assessment: Normal   Communication Communication Communication: No difficulties   Cognition Arousal/Alertness: Awake/alert Behavior During Therapy: WFL for tasks assessed/performed Overall Cognitive Status: Within Functional Limits for tasks assessed                                       General Comments       Exercises     Shoulder Instructions      Home Living Family/patient expects to be discharged to:: Private residence Living Arrangements: Spouse/significant other Available Help at Discharge: Family Type of Home: House Home Access: Stairs to enter Technical brewer of Steps: 4   Home Layout: Two level;Able to live on main level with  bedroom/bathroom Alternate Level Stairs-Number of Steps: flight-   Bathroom Shower/Tub: Teacher, early years/pre: Standard     Home Equipment: Conservation officer, nature (2 wheels);Standard Walker;BSC/3in1          Prior Functioning/Environment Prior Level of Function : Independent/Modified Independent                        OT Problem List: Decreased activity tolerance;Impaired balance (sitting and/or standing);Decreased coordination;Decreased safety awareness;Decreased knowledge of precautions;Decreased knowledge of use of DME or AE;Pain      OT Treatment/Interventions: Self-care/ADL training;Energy conservation;Therapeutic exercise;DME and/or AE instruction;Neuromuscular education;Therapeutic activities;Patient/family education;Balance training    OT Goals(Current goals can be found in the care plan section) Acute Rehab OT Goals Patient Stated Goal: to get better OT Goal Formulation: With patient Time For Goal Achievement: 03/22/22 Potential to Achieve Goals: Fair  OT Frequency: Min 2X/week    Co-evaluation              AM-PAC OT "6 Clicks" Daily Activity     Outcome Measure Help from another person eating meals?: None Help from another person taking care of personal grooming?: A Little Help from another person toileting, which includes using toliet, bedpan, or urinal?: A Lot Help from another person bathing (including washing, rinsing, drying)?: A Lot Help from another person to put on and taking off regular upper body clothing?: A Little Help from another person to put on and  taking off regular lower body clothing?: A Lot 6 Click Score: 16   End of Session Equipment Utilized During Treatment: Rolling walker (2 wheels);Gait belt Nurse Communication: Mobility status  Activity Tolerance: Patient tolerated treatment well Patient left: in chair;with call bell/phone within reach;with chair alarm set  OT Visit Diagnosis: Unsteadiness on feet (R26.81);Other  abnormalities of gait and mobility (R26.89)                Time: 3744-5146 OT Time Calculation (min): 37 min Charges:  OT General Charges $OT Visit: 1 Visit OT Evaluation $OT Eval Moderate Complexity: 1 Mod OT Treatments $Self Care/Home Management : 8-22 mins  Rennie Plowman, MS Acute Rehabilitation Department Office# [phone]   Willa Rough 03/08/2022, 9:59 AM

## 2022-03-08 NOTE — H&P (Shared)
Physical Medicine and Rehabilitation Admission H&P    Chief Complaint  Patient presents with   Functional deficits due to hip fracture     HPI: Joel Gonzales is a 76 year old  male with history of CAD s/p CABG, stroke in the past, T2DM. Vascular parkinsonism with gait disorder w/falls who was admitted on 03/02/22 after losing his balance and sustaining acute intra-articular left femoral neck fracture. He was evaluated by Dr. Ninfa Linden and underwent left anterior total hip arthroplasty on 03/03/22. Post op WBAT and on lovenox to DVT prophylaxis. Therapy ongoing and patient continues to be limited by posterior bias, pain, decreased in carryover and    Review of Systems  Constitutional:  Negative for chills and fever.  HENT:  Negative for hearing loss and tinnitus.   Musculoskeletal:  Positive for falls (falls backward without warning.).  Neurological:  Positive for weakness. Negative for dizziness and headaches.     Past Medical History:  Diagnosis Date   CAD (coronary artery disease)    Diabetes mellitus without complication ([place])    Type 2   Family history of ASCVD    History of stress test 01/2012   which was entirely normal   Hx of CABG    x6 LIma to his LAD, a RIMA to his PDA, right radial to obtuse marginal branch, diagnonal, second marginal, & posteolateral branches   Hyperlipemia    Hypertension    RBBB    chronic    Past Surgical History:  Procedure Laterality Date   CHOLECYSTECTOMY  2002   CORONARY ARTERY BYPASS GRAFT     x6 LIma to his LAD, a RIMA to his PDA, right radial to obtuse marginal branch, diagnonal, second marginal, & posteolateral branches   NOSE SURGERY  02/2013   carcinoma   TONSILLECTOMY     as a child   TOTAL HIP ARTHROPLASTY Right 02/18/2021   Procedure: TOTAL HIP ARTHROPLASTY ANTERIOR APPROACH;  Surgeon: Mcarthur Rossetti, MD;  Location: [place];  Service: Orthopedics;  Laterality: Right;   TOTAL HIP ARTHROPLASTY Left 03/03/2022    Procedure: TOTAL HIP ARTHROPLASTY ANTERIOR APPROACH;  Surgeon: Mcarthur Rossetti, MD;  Location: [facility];  Service: Orthopedics;  Laterality: Left;    Family History  Problem Relation Age of Onset   Heart attack Father 45       deceased, ASCVD   Hypertension Father    Heart disease Father    Heart Problems Mother        ASCVD   Hypertension Mother    Heart attack Mother    Heart attack Sister    Hypertension Brother    Hydrocephalus Brother     Social History:  reports that he has never smoked. He has never used smokeless tobacco. He reports current alcohol use. He reports that he does not use drugs.   Allergies: Not on File   Medications Prior to Admission  Medication Sig Dispense Refill   acetaminophen (TYLENOL) 500 MG tablet Take 1,000 mg by mouth every 6 (six) hours as needed for moderate pain.     atorvastatin (LIPITOR) 80 MG tablet Take 80 mg by mouth at bedtime.     clopidogrel (PLAVIX) 75 MG tablet Take 1 tablet (75 mg total) by mouth daily. 30 tablet 0   Empagliflozin-metFORMIN HCl ER (SYNJARDY XR) 25-1000 MG TB24 Take 1 tablet by mouth daily.     metoprolol succinate (TOPROL-XL) 50 MG 24 hr tablet TAKE 1 TABLET WITH OR IMMEDIATELY FOLLOWING  A MEAL ONCE A DAY. (Patient taking differently: 50 mg daily.) 90 tablet 2   pantoprazole (PROTONIX) 40 MG tablet Take 1 tablet (40 mg total) by mouth daily. 90 tablet 3   pioglitazone (ACTOS) 15 MG tablet Take 15 mg by mouth daily.     ramipril (ALTACE) 5 MG capsule TAKE 1 CAPSULE BY MOUTH TWICE DAILY (Patient taking differently: Take 5 mg by mouth 2 (two) times daily.) 180 capsule 2   alendronate (FOSAMAX) 70 MG tablet Take 70 mg by mouth once a week. (Patient not taking: Reported on 03/02/2022)     carbidopa-levodopa (SINEMET IR) 25-100 MG tablet Take 1 tablet by mouth 2 (two) times daily. Take on an empty stomach or with crackers if makes nauseated, If have side effects try 1/2 tablet twice daily. (Patient not taking: Reported  on 03/02/2022) 60 tablet 6      Home: [place] expects to be discharged to:: Private residence Living Arrangements: Spouse/significant other Available Help at Discharge: Family Type of Home: House Home Access: Stairs to enter Technical brewer of Steps: 4 Home Layout: Two level, Able to live on main level with bedroom/bathroom Alternate Level Stairs-Number of Steps: flight- Bathroom Shower/Tub: Chiropodist: Standard Home Equipment: Conservation officer, nature (2 wheels), Chartered certified accountant, BSC/3in1   Functional History: Prior Function Prior Level of Function : Independent/Modified Independent Mobility Comments: He fell about a year ago and broke R hip and so he has gone through this before.  Functional Status:  Mobility: Bed Mobility Overal bed mobility: Needs Assistance Bed Mobility: Supine to Sit Supine to sit: Min assist Sit to supine: Min assist General bed mobility comments: Patient found in recliner prior to session and returning to recliner. Transfers Overall transfer level: Needs assistance Equipment used: Rolling walker (2 wheels) Transfers: Sit to/from Stand Sit to Stand: Min assist General transfer comment: Min assist needed to power up from recliner and steady. Cues needed for hand placement. Ambulation/Gait Ambulation/Gait assistance: Min assist Gait Distance (Feet): 120 Feet Assistive device: Rolling walker (2 wheels) Gait Pattern/deviations: Step-through pattern, Decreased stride length General Gait Details: Min assist needed to ambulate 120 ft with RW for safety. Ambulated with towels on RW handles to prevent pt from removing his hands "my hands get sweaty." Pt stated "that really helped, it was actually more comfortable too." Gait velocity: decreased    ADL: ADL Overall ADL's : Needs assistance/impaired Eating/Feeding: Set up, Sitting Grooming: Minimal assistance, Standing Upper Body Bathing: Min guard, Sitting Lower  Body Bathing: Moderate assistance, Sitting/lateral leans Upper Body Dressing : Min guard, Sitting Lower Body Dressing: Moderate assistance, Sitting/lateral leans Toilet Transfer: Minimal assistance, Ambulation, Rolling walker (2 wheels) Toilet Transfer Details (indicate cue type and reason): with cues for keeping toes on the floor and sequencing of sit to stand and stand to sit for safety and preventing posterior leaning Toileting- Clothing Manipulation and Hygiene: Minimal assistance, Sit to/from stand Toileting - Clothing Manipulation Details (indicate cue type and reason): standing with RW with one UE support needed to maintain balance.  Cognition: Cognition Overall Cognitive Status: Within Functional Limits for tasks assessed Orientation Level: Oriented X4 Cognition Arousal/Alertness: Awake/alert Behavior During Therapy: WFL for tasks assessed/performed Overall Cognitive Status: Within Functional Limits for tasks assessed   Blood pressure 133/69, pulse 79, temperature 98.1 F (36.7 C), temperature source Oral, resp. rate 20, height 6' (1.829 m), weight 89 kg, SpO2 97 %. Physical Exam Vitals and nursing note reviewed.  Constitutional:      Appearance: Normal appearance.  HENT:     Head: Normocephalic and atraumatic.  Eyes:     Comments: Infra-orbital ecchymosis noted  Musculoskeletal:     Comments: Min edema left thigh. Surgical dressing left hip.   Neurological:     Mental Status: He is alert.     Results for orders placed or performed during the hospital encounter of 03/02/22 (from the past 48 hour(s))  Glucose, capillary     Status: Abnormal   Collection Time: 03/06/22  4:57 PM  Result Value Ref Range   Glucose-Capillary 152 (H) 70 - 99 mg/dL    Comment: Glucose reference range applies only to samples taken after fasting for at least 8 hours.  Glucose, capillary     Status: Abnormal   Collection Time: 03/06/22  9:33 PM  Result Value Ref Range   Glucose-Capillary 171  (H) 70 - 99 mg/dL    Comment: Glucose reference range applies only to samples taken after fasting for at least 8 hours.  CBC with Differential/Platelet     Status: Abnormal   Collection Time: 03/07/22  5:59 AM  Result Value Ref Range   WBC 7.4 4.0 - 10.5 K/uL   RBC 3.51 (L) 4.22 - 5.81 MIL/uL   Hemoglobin 11.2 (L) 13.0 - 17.0 g/dL   HCT 33.5 (L) 39.0 - 52.0 %   MCV 95.4 80.0 - 100.0 fL   MCH 31.9 26.0 - 34.0 pg   MCHC 33.4 30.0 - 36.0 g/dL   RDW 13.4 11.5 - 15.5 %   Platelets 232 150 - 400 K/uL   nRBC 0.0 0.0 - 0.2 %   Neutrophils Relative % 69 %   Neutro Abs 5.1 1.7 - 7.7 K/uL   Lymphocytes Relative 15 %   Lymphs Abs 1.1 0.7 - 4.0 K/uL   Monocytes Relative 9 %   Monocytes Absolute 0.7 0.1 - 1.0 K/uL   Eosinophils Relative 5 %   Eosinophils Absolute 0.3 0.0 - 0.5 K/uL   Basophils Relative 1 %   Basophils Absolute 0.1 0.0 - 0.1 K/uL   Immature Granulocytes 1 %   Abs Immature Granulocytes 0.08 (H) 0.00 - 0.07 K/uL    Comment: Performed at [facility], [place] [address] [place], [place] [address]  Basic metabolic panel     Status: Abnormal   Collection Time: 03/07/22  5:59 AM  Result Value Ref Range   Sodium 138 135 - 145 mmol/L   Potassium 3.4 (L) 3.5 - 5.1 mmol/L   Chloride 104 98 - 111 mmol/L   CO2 24 22 - 32 mmol/L   Glucose, Bld 126 (H) 70 - 99 mg/dL    Comment: Glucose reference range applies only to samples taken after fasting for at least 8 hours.   BUN 23 8 - 23 mg/dL   Creatinine, Ser 1.07 0.61 - 1.24 mg/dL   Calcium 8.7 (L) 8.9 - 10.3 mg/dL   GFR, Estimated >60 >60 mL/min    Comment: (NOTE) Calculated using the CKD-EPI Creatinine Equation (2021)    Anion gap 10 5 - 15    Comment: Performed at [facility], [place] [address] [place], [place] [address]  Glucose, capillary     Status: Abnormal   Collection Time: 03/07/22  7:45 AM  Result Value Ref Range   Glucose-Capillary 128 (H) 70 - 99 mg/dL    Comment: Glucose reference  range applies only to samples taken after fasting for at least 8 hours.  Glucose, capillary     Status: Abnormal  Collection Time: 03/07/22 11:40 AM  Result Value Ref Range   Glucose-Capillary 190 (H) 70 - 99 mg/dL    Comment: Glucose reference range applies only to samples taken after fasting for at least 8 hours.  Glucose, capillary     Status: Abnormal   Collection Time: 03/07/22  4:06 PM  Result Value Ref Range   Glucose-Capillary 132 (H) 70 - 99 mg/dL    Comment: Glucose reference range applies only to samples taken after fasting for at least 8 hours.  Glucose, capillary     Status: Abnormal   Collection Time: 03/07/22  9:03 PM  Result Value Ref Range   Glucose-Capillary 182 (H) 70 - 99 mg/dL    Comment: Glucose reference range applies only to samples taken after fasting for at least 8 hours.  Glucose, capillary     Status: Abnormal   Collection Time: 03/08/22  7:43 AM  Result Value Ref Range   Glucose-Capillary 130 (H) 70 - 99 mg/dL    Comment: Glucose reference range applies only to samples taken after fasting for at least 8 hours.  Glucose, capillary     Status: Abnormal   Collection Time: 03/08/22 11:32 AM  Result Value Ref Range   Glucose-Capillary 163 (H) 70 - 99 mg/dL    Comment: Glucose reference range applies only to samples taken after fasting for at least 8 hours.   No results found.    Blood pressure 133/69, pulse 79, temperature 98.1 F (36.7 C), temperature source Oral, resp. rate 20, height 6' (1.829 m), weight 89 kg, SpO2 97 %.  Medical Problem List and Plan: 1. Functional deficits secondary to ***  -patient may *** shower  -ELOS/Goals: *** 2.  Antithrombotics: -DVT/anticoagulation:  Pharmaceutical: Lovenox  -antiplatelet therapy: Plavix 3. Pain Management: Oxycodone prn.  4. Mood/Behavior/Sleep: LCSW to follow for evaluation and support.   --trazodone prn for sleep  -antipsychotic agents: N/A 5. Neuropsych/cognition: This patient maybe capable of  making decisions on his own behalf. 6. Skin/Wound Care: Routine pressure relief measure.  7. Fluids/Electrolytes/Nutrition: Monitor I/O. Check CMET in am 8. HTN: Monitor BP TID--continue Altace and metoprolol  9. H/o CVA/CAD:  Resume Plavix 10. T2DM: Monitor BS ac/hs. Continue Actos and Jardiance/Metformin  --SSI for elevated BS.  11. Hypokalemia: Recheck in am 12. ABLA: recheck in am. 13. URI: On mucinex for bad cold (got it from daughter visiting for thanksgiving)    ***  Bary Leriche, PA-C 03/08/2022

## 2022-03-08 NOTE — Progress Notes (Addendum)
Physical Therapy Treatment Patient Details Name: Joel Gonzales. MRN: [id number] DOB: Jan 24, 1946 Today's Date: 03/08/2022   History of Present Illness Joel Gonzales. is a 76 y.o. male who was ambulating and felt himself getting faster and faster and couldn't stop himself and fell into his car and fell. fall resulted in LLE pain. imaging revealed L intra-articular left femoral neck fracture and is s/p L THA anterior approach (03/03/22). 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    PT Comments    POD x 5 am session. Patient denied any pain at rest, c/o of 2/10 pain when standing/ambulating. Patient found in recliner prior to session. Required min assist for transfers to power up from recliner and steady. Cues needed for hand placement.  Min assist needed to ambulate 120 ft with RW for safety. Ambulated with towels wrapped around RW handles to prevent pt from removing his hands "my hands get sweaty." Pt stated "that really helped, it was actually more comfortable too." Highest HR during ambulation was 110. Reviewed HEP with patient and addressed any questions. Educated patient on use of modified safety belt to guide LLE on/off bed and to assist with exercises. Applied ice on L hip patient stated "what's that for" after educating patient about use of ice patient stated "someone put ice on it once and that's all." Patient plans on discharging to acute inpatient rehab.   Recommendations for follow up therapy are one component of a multi-disciplinary discharge planning process, led by the attending physician.  Recommendations may be updated based on patient status, additional functional criteria and insurance authorization.  Follow Up Recommendations  Acute inpatient rehab (3hours/day)     Assistance Recommended at Discharge Intermittent Supervision/Assistance  Patient can return home with the following A little help with walking and/or transfers;Help with stairs or ramp  for entrance;A little help with bathing/dressing/bathroom;Other (comment)   Equipment Recommendations  None recommended by PT    Recommendations for Other Services       Precautions / Restrictions Precautions Precautions: Fall Restrictions Weight Bearing Restrictions: No LLE Weight Bearing: Weight bearing as tolerated     Mobility  Bed Mobility               General bed mobility comments: Patient found in recliner prior to session and returning to recliner.    Transfers Overall transfer level: Needs assistance Equipment used: Rolling walker (2 wheels) Transfers: Sit to/from Stand Sit to Stand: Min assist           General transfer comment: Min assist needed to power up from recliner and steady. Cues needed for hand placement.    Ambulation/Gait Ambulation/Gait assistance: Min assist Gait Distance (Feet): 120 Feet Assistive device: Rolling walker (2 wheels) Gait Pattern/deviations: Step-through pattern, Decreased stride length Gait velocity: decreased     General Gait Details: Min assist needed to ambulate 120 ft with RW for safety. Ambulated with towels on RW handles to prevent pt from removing his hands "my hands get sweaty." Pt stated "that really helped, it was actually more comfortable too."   Stairs             Wheelchair Mobility    Modified Rankin (Stroke Patients Only)       Balance  Cognition Arousal/Alertness: Awake/alert Behavior During Therapy: WFL for tasks assessed/performed Overall Cognitive Status: Within Functional Limits for tasks assessed                                          Exercises Total Joint Exercises Ankle Circles/Pumps: AROM, Both, 10 reps Quad Sets: Supine, 5 reps, Left Towel Squeeze: AROM, Both, 5 reps Short Arc Quad: AROM, Left, 5 reps Heel Slides: Left, 5 reps, AAROM Hip ABduction/ADduction: AAROM, Left, 5 reps     General Comments        Pertinent Vitals/Pain Pain Assessment Pain Score: 2  Pain Location: L hip with movement Pain Descriptors / Indicators: Grimacing, Sore Pain Intervention(s): Limited activity within patient's tolerance, Monitored during session, Premedicated before session    Home Living                          Prior Function            PT Goals (current goals can now be found in the care plan section) Acute Rehab PT Goals Patient Stated Goal: Go home PT Goal Formulation: With patient Time For Goal Achievement: 03/18/22 Potential to Achieve Goals: Good Progress towards PT goals: Progressing toward goals    Frequency    Min 5X/week      PT Plan Current plan remains appropriate    Co-evaluation              AM-PAC PT "6 Clicks" Mobility   Outcome Measure  Help needed turning from your back to your side while in a flat bed without using bedrails?: A Little Help needed moving from lying on your back to sitting on the side of a flat bed without using bedrails?: A Little Help needed moving to and from a bed to a chair (including a wheelchair)?: A Little Help needed standing up from a chair using your arms (e.g., wheelchair or bedside chair)?: A Little Help needed to walk in hospital room?: A Little Help needed climbing 3-5 steps with a railing? : A Lot 6 Click Score: 17    End of Session Equipment Utilized During Treatment: Gait belt Activity Tolerance: Patient tolerated treatment well Patient left: in chair;with call bell/phone within reach;with chair alarm set Nurse Communication: Mobility status PT Visit Diagnosis: Unsteadiness on feet (R26.81);Difficulty in walking, not elsewhere classified (R26.2);Pain Pain - Right/Left: Left Pain - part of body: Hip     Time: 3845-3646 PT Time Calculation (min) (ACUTE ONLY): 24 min  Charges:  $Gait Training: 8-22 mins $Therapeutic Exercise: 8-22 mins                       Harrel Ferrone 03/08/2022, 1:24 PM

## 2022-03-08 NOTE — Progress Notes (Signed)
Inpatient Rehab Admissions Coordinator:  ° °I have a CIR bed for this Pt. Today. RN may call report to [phone]. ° °Jocob Dambach, MS, CCC-SLP °Rehab Admissions Coordinator  °[phone] (celll) °[phone] (office) ° °

## 2022-03-08 NOTE — Progress Notes (Signed)
Report called to [place] at [facility]. Pt's wife made aware of discharge by pt. All belongings sent with pt at time of discharge.

## 2022-03-08 NOTE — Progress Notes (Signed)
Inpatient Rehab Admissions Coordinator:    I do not have a CIR bed for this Pt. Today. Will follow for potential admit pending bed availability.  Clemens Catholic, [place], [place] Admissions Coordinator  [phone] ([place]) [phone] (office)

## 2022-03-08 NOTE — Progress Notes (Incomplete)
Inpatient Rehabilitation Admission Medication Review by a Pharmacist  A complete drug regimen review was completed for this patient to identify any potential clinically significant medication issues.  High Risk Drug Classes Is patient taking? Indication by Medication  Antipsychotic {Receiving?:26196}   Anticoagulant {Receiving?:26196}   Antibiotic {Receiving?:26196}   Opioid {Receiving?:26196}   Antiplatelet {Receiving?:26196} Clopidogrel - CAD, hx CABG  Hypoglycemics/insulin {Yes or No?:26198} Empagliflozin, metformin, insulin aspart, pioglitazone- DM  Vasoactive Medication {Receiving?:26196} Metoprolol, ramipril - HTN  Chemotherapy {Receiving Chemo?:26197}   Other {Yes or No?:26198} Atorvastatin - HLD Docusate, senokot - constipation Enoxaparin - VTE ppx Pantoprazole - GERD ppx     Type of Medication Issue Identified Description of Issue Recommendation(s)  Drug Interaction(s) (clinically significant)     Duplicate Therapy     Allergy     No Medication Administration End Date     Incorrect Dose     Additional Drug Therapy Needed     Significant med changes from prior encounter (inform family/care partners about these prior to discharge). Sinemet not started prior to admission  Alendronate not started prior to admission  Synjardy changed to empagliflozin/metformin while IP Communicate medication changes with patient/family at discharge  Other       Clinically significant medication issues were identified that warrant physician communication and completion of prescribed/recommended actions by midnight of the next day:  {Yes or No?:26198}  Name of provider notified for urgent issues identified: ***   Provider Method of Notification: ***    Pharmacist comments: ***   Time spent performing this drug regimen review (minutes): 20   Thank you for allowing pharmacy to be a part of this patient's care.  Ardyth Harps, PharmD Clinical Pharmacist

## 2022-03-08 NOTE — PMR Pre-admission (Signed)
PMR Admission Coordinator Pre-Admission Assessment  Patient: Joel Gonzales. is an 76 y.o., male MRN: [id number] DOB: 09-Jul-1945 Height: 6' (182.9 cm) Weight: 89 kg  Insurance Information HMO:     PPO:      PCP:      IPA:      80/20: yes     OTHER:  PRIMARY: Medicare A and B      Policy#: [id number]       Subscriber: pt Phone#: Verified online    Fax#:  Pre-Cert#:       Employer:  Benefits:  Phone #:      Name:  Eff. Date: Parts A and B effective 12/02/2009  Deduct: $1600      Out of Pocket Max:  None      Life Max: N/A  CIR: 100%      SNF: 100 days Outpatient: 80%     Co-Pay: 20% Home Health: 100%      Co-Pay: none DME: 80%     Co-Pay: 20% Providers: patient's choice SECONDARY: BCBS Supplement       Policy#: [id number]      Phone#:   Financial Counselor:       Phone#:   The "Data Collection Information Summary" for patients in Inpatient Rehabilitation Facilities with attached "Privacy Act [place] Records" was provided and verbally reviewed with: Patient  Emergency Contact Information Contact Information     Name Relation Home Work Mobile   JUWON, SCRIPTER [phone]  [phone]       Current Medical History  Patient Admitting Diagnosis: THA  History of Present Illness:Joel Gonzales. is a 76 y.o. 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 Patient was brought into [place]  ED  03/02/22 after an episode of fall. Patient was diagnosed with vascular parkinsonism 4 years ago at [facility].  He has trouble with his gait.  He sometimes has trouble stopping and tends to fall forward.  The day of admission,  he was walking and  fell forward hitting his face against a parked car, then fell to the ground impacting his left hip.  He sustained a scrap to his nose and forehead and started hurting in the left hip and is brought to the ED. In the ED Pt. Was   hemodynamically stable, breathing on room air.Labs with WC count 11.3, hemoglobin 15.1, platelet 219, BUN/creatinine 29/1.19, glucose level elevated to 189.CT scan of left hip showed an acute intra-articular left femoral neck fracture with transcervical and subcapital components and mild apex anterior angulation. CT scan of head, cervical spine and maxillofacial were also obtained. No acute intracranial abnormality. Negative for cervical spine fracture. Atrophy and chronic microvascular ischemic change in the white matter. Fracture of the nasal bone with mild displacement. No other facial fracture.  ED physician discussed the case with Orthopedics Dr. Ninfa Linden. Pt. Underwent L THA anterior approach (03/03/22)  with Dr. Ninfa Linden. Pt. Seen by PT/OT and they recommend CIR to assist return to PLOF.      Patient's medical record from [facility]  has been reviewed by the rehabilitation admission coordinator and physician.  Past Medical History  Past Medical History:  Diagnosis Date   CAD (coronary artery disease)    Diabetes mellitus without complication ([place])    Type 2   Family history of ASCVD    History of stress test 01/2012   which was  entirely normal   Hx of CABG    x6 LIma to his LAD, a RIMA to his PDA, right radial to obtuse marginal branch, diagnonal, second marginal, & posteolateral branches   Hyperlipemia    Hypertension    RBBB    chronic    Has the patient had major surgery during 100 days prior to admission? Yes  Family History   family history includes Heart Problems in his mother; Heart attack in his mother and sister; Heart attack (age of onset: 50) in his father; Heart disease in his father; Hydrocephalus in his brother; Hypertension in his brother, father, and mother.  Current Medications  Current Facility-Administered Medications:    acetaminophen (TYLENOL) tablet 650 mg, 650 mg, Oral, Q6H PRN, 650 mg at 03/08/22 0801 **OR** acetaminophen (TYLENOL) suppository  650 mg, 650 mg, Rectal, Q6H PRN, Mcarthur Rossetti, MD   acetaminophen (TYLENOL) tablet 325-650 mg, 325-650 mg, Oral, Q6H PRN, Mcarthur Rossetti, MD, 650 mg at 03/07/22 2134   albuterol (PROVENTIL) (2.5 MG/3ML) 0.083% nebulizer solution 2.5 mg, 2.5 mg, Nebulization, Q6H PRN, Mcarthur Rossetti, MD   atorvastatin (LIPITOR) tablet 80 mg, 80 mg, Oral, QHS, Mcarthur Rossetti, MD, 80 mg at 03/07/22 2133   bisacodyl (DULCOLAX) suppository 10 mg, 10 mg, Rectal, Daily PRN, Mcarthur Rossetti, MD   clopidogrel (PLAVIX) tablet 75 mg, 75 mg, Oral, Daily, Mcarthur Rossetti, MD, 75 mg at 03/07/22 4315   diphenhydrAMINE (BENADRYL) 12.5 MG/5ML elixir 12.5-25 mg, 12.5-25 mg, Oral, Q4H PRN, Mcarthur Rossetti, MD   docusate sodium (COLACE) capsule 100 mg, 100 mg, Oral, BID, Mcarthur Rossetti, MD, 100 mg at 03/07/22 4008   empagliflozin (JARDIANCE) tablet 25 mg, 25 mg, Oral, Q lunch, 25 mg at 03/07/22 1323 **AND** metFORMIN (GLUCOPHAGE-XR) 24 hr tablet 1,000 mg, 1,000 mg, Oral, Q lunch, Dahal, Binaya, MD, 1,000 mg at 03/07/22 1323   enoxaparin (LOVENOX) injection 40 mg, 40 mg, Subcutaneous, Q24H, Mcarthur Rossetti, MD, 40 mg at 03/07/22 1948   guaiFENesin (MUCINEX) 12 hr tablet 600 mg, 600 mg, Oral, BID, Dahal, Binaya, MD, 600 mg at 03/07/22 1556   hydrALAZINE (APRESOLINE) injection 10 mg, 10 mg, Intravenous, Q6H PRN, Mcarthur Rossetti, MD   HYDROmorphone (DILAUDID) injection 0.5-1 mg, 0.5-1 mg, Intravenous, Q4H PRN, Mcarthur Rossetti, MD   insulin aspart (novoLOG) injection 0-5 Units, 0-5 Units, Subcutaneous, QHS, Mcarthur Rossetti, MD, 2 Units at 03/03/22 2304   insulin aspart (novoLOG) injection 0-9 Units, 0-9 Units, Subcutaneous, TID WC, Mcarthur Rossetti, MD, 1 Units at 03/08/22 6761   menthol-cetylpyridinium (CEPACOL) lozenge 3 mg, 1 lozenge, Oral, PRN **OR** phenol (CHLORASEPTIC) mouth spray 1 spray, 1 spray, Mouth/Throat, PRN, Mcarthur Rossetti, MD   methocarbamol (ROBAXIN) 500 mg in dextrose 5 % 50 mL IVPB, 500 mg, Intravenous, Q6H PRN, Mcarthur Rossetti, MD   methocarbamol (ROBAXIN) tablet 500 mg, 500 mg, Oral, Q6H PRN, 500 mg at 03/08/22 0124 **OR** methocarbamol (ROBAXIN) 500 mg in dextrose 5 % 50 mL IVPB, 500 mg, Intravenous, Q6H PRN, Mcarthur Rossetti, MD, Stopped at 03/03/22 1722   metoCLOPramide (REGLAN) tablet 5-10 mg, 5-10 mg, Oral, Q8H PRN **OR** metoCLOPramide (REGLAN) injection 5-10 mg, 5-10 mg, Intravenous, Q8H PRN, Mcarthur Rossetti, MD   metoprolol succinate (TOPROL-XL) 24 hr tablet 50 mg, 50 mg, Oral, Daily, Mcarthur Rossetti, MD, 50 mg at 03/07/22 9509   morphine (PF) 2 MG/ML injection 2 mg, 2 mg, Intravenous, Q2H PRN, Mcarthur Rossetti, MD, 2 mg  at 03/02/22 1711   ondansetron (ZOFRAN) tablet 4 mg, 4 mg, Oral, Q6H PRN **OR** ondansetron (ZOFRAN) injection 4 mg, 4 mg, Intravenous, Q6H PRN, Mcarthur Rossetti, MD   oxyCODONE (Oxy IR/ROXICODONE) immediate release tablet 10-15 mg, 10-15 mg, Oral, Q4H PRN, Mcarthur Rossetti, MD, 10 mg at 03/04/22 1521   oxyCODONE (Oxy IR/ROXICODONE) immediate release tablet 5 mg, 5 mg, Oral, Q4H PRN, Mcarthur Rossetti, MD, 5 mg at 03/08/22 0235   oxyCODONE (Oxy IR/ROXICODONE) immediate release tablet 5-10 mg, 5-10 mg, Oral, Q4H PRN, Mcarthur Rossetti, MD, 10 mg at 03/04/22 2108   pantoprazole (PROTONIX) EC tablet 40 mg, 40 mg, Oral, Daily, Mcarthur Rossetti, MD, 40 mg at 03/07/22 1324   pioglitazone (ACTOS) tablet 15 mg, 15 mg, Oral, Daily, Mcarthur Rossetti, MD, 15 mg at 03/07/22 4097   ramipril (ALTACE) capsule 5 mg, 5 mg, Oral, BID, Dahal, Binaya, MD, 5 mg at 03/07/22 2134   senna (SENOKOT) tablet 8.6 mg, 1 tablet, Oral, BID, Mcarthur Rossetti, MD, 8.6 mg at 03/07/22 3532  Patients Current Diet:  Diet Order             Diet regular Room service appropriate? Yes; Fluid consistency: Thin  Diet effective now                    Precautions / Restrictions Precautions Precautions: Fall Restrictions Weight Bearing Restrictions: No LLE Weight Bearing: Weight bearing as tolerated   Has the patient had 2 or more falls or a fall with injury in the past year? Yes  Prior Activity Level Community (5-7x/wk): Pt. active in the community PTA  Prior Functional Level Self Care: Did the patient need help bathing, dressing, using the toilet or eating? Independent  Indoor Mobility: Did the patient need assistance with walking from room to room (with or without device)? Independent  Stairs: Did the patient need assistance with internal or external stairs (with or without device)? Independent  Functional Cognition: Did the patient need help planning regular tasks such as shopping or remembering to take medications? Independent  Patient Information Are you of Hispanic, Latino/a,or Spanish origin?: A. No, not of Hispanic, Latino/a, or Spanish origin What is your race?: A. White Do you need or want an interpreter to communicate with a doctor or health care staff?: 0. No  Patient's Response To:  Health Literacy and Transportation Is the patient able to respond to health literacy and transportation needs?: Yes Health Literacy - How often do you need to have someone help you when you read instructions, pamphlets, or other written material from your doctor or pharmacy?: Never In the past 12 months, has lack of transportation kept you from medical appointments or from getting medications?: No In the past 12 months, has lack of transportation kept you from meetings, work, or from getting things needed for daily living?: No  Home Assistive Devices / Butters Devices/Equipment: Radio producer (specify quad or straight) (straight) Home Equipment: Conservation officer, nature (2 wheels), Standard Walker, BSC/3in1  Prior Device Use: Indicate devices/aids used by the patient prior to current illness, exacerbation or  injury?  none  Current Functional Level Cognition  Overall Cognitive Status: Within Functional Limits for tasks assessed Orientation Level: Oriented X4    Extremity Assessment (includes Sensation/Coordination)  Upper Extremity Assessment: Overall WFL for tasks assessed  Lower Extremity Assessment: LLE deficits/detail LLE Deficits / Details: ROM limited 50% in supine    ADLs       Mobility  Overal  bed mobility: Needs Assistance Bed Mobility: Supine to Sit Supine to sit: Min assist Sit to supine: Min assist General bed mobility comments: min assist at trunk to sit upright, posterior bias initially    Transfers  Overall transfer level: Needs assistance Equipment used: Rolling walker (2 wheels) Transfers: Sit to/from Stand Sit to Stand: Min assist General transfer comment: assist to rise and stabilize, cues for hand placement and forward weight shift; pt presents with posterior bias and LEs leaning against bed to self stabilize, performed sit to stand x3 for technique and improved with last sit to stand to min/guard    Ambulation / Gait / Stairs / Wheelchair Mobility  Ambulation/Gait Ambulation/Gait assistance: Herbalist (Feet): 160 Feet Assistive device: Rolling walker (2 wheels) Gait Pattern/deviations: Step-through pattern, Decreased stride length General Gait Details: verbal cues for use of RW, pt tends to remove hands from walker to wipe (reports hands "sweaty") Gait velocity: decreased    Posture / Balance Dynamic Sitting Balance Sitting balance - Comments: initially posterior Balance Overall balance assessment: History of Falls, Needs assistance Sitting-balance support: No upper extremity supported, Bilateral upper extremity supported, Feet supported Sitting balance-Leahy Scale: Fair Sitting balance - Comments: initially posterior Postural control: Posterior lean Standing balance support: During functional activity, Bilateral upper extremity supported,  Reliant on assistive device for balance Standing balance-Leahy Scale: Poor Standing balance comment: significant posterior bias with standing today, required assist, also cues for upper body use to assist with stabilizing (was not pushing down on RW)    Special needs/care consideration Skin surgical incision    Previous Home Environment (from acute therapy documentation) Living Arrangements: Spouse/significant other Available Help at Discharge: Family Type of Home: House Home Layout: Two level, Able to live on main level with bedroom/bathroom Alternate Level Stairs-Number of Steps: flight- Home Access: Stairs to enter [employer]: [place]: No  Discharge Living Setting Plans for Discharge Living Setting: Patient's home Type of Home at Discharge: House Discharge Home Layout: Two level, Able to live on main level with bedroom/bathroom Alternate Level Stairs-Number of Steps: flight Discharge Home Access: Stairs to enter Entrance Stairs-Rails: Left Entrance Stairs-Number of Steps: 3 Discharge Bathroom Shower/Tub: Tub/shower unit Discharge Bathroom Toilet: Standard Discharge Bathroom Accessibility: No Does the patient have any problems obtaining your medications?: No  Social/Family/Support Systems Patient Roles: Spouse Contact Information: [phone] Anticipated Caregiver: Delano Frate Anticipated Caregiver's Contact Information: [phone] Ability/Limitations of Caregiver: Min A Caregiver Availability: 24/7 Discharge Plan Discussed with Primary Caregiver: Yes Is Caregiver In Agreement with Plan?: Yes Does Caregiver/Family have Issues with Lodging/Transportation while Pt is in Rehab?: No  Goals Patient/Family Goal for Rehab: PT/OT Mod I Expected length of stay: 8-10 days Pt/Family Agrees to Admission and willing to participate: Yes Program Orientation Provided & Reviewed with Pt/Caregiver Including Roles  & Responsibilities: Yes  Decrease burden of  Care through IP rehab admission: not anticipated   Possible need for SNF placement upon discharge: none   Patient Condition: I have reviewed medical records from [facility] , spoken with CM, and patient. I met with patient at the bedside for inpatient rehabilitation assessment.  Patient will benefit from ongoing PT and OT, can actively participate in 3 hours of therapy a day 5 days of the week, and can make measurable gains during the admission.  Patient will also benefit from the coordinated team approach during an Inpatient Acute Rehabilitation admission.  The patient will receive intensive therapy as well as Rehabilitation physician, nursing, social worker, and  care management interventions.  Due to safety, skin/wound care, disease management, medication administration, pain management, and patient education the patient requires 24 hour a day rehabilitation nursing.  The patient is currently min A with mobility and basic ADLs.  Discharge setting and therapy post discharge at home with home health is anticipated.  Patient has agreed to participate in the Acute Inpatient Rehabilitation Program and will admit today.  Preadmission Screen Completed By:  [name], 03/08/2022 8:53 AM ______________________________________________________________________   Discussed status with Dr. Ranell Patrick  on 03/08/22 at 33  and received approval for admission today.  Admission Coordinator:  [name], CCC-SLP, time 930/Date 03/08/22    Assessment/Plan: Diagnosis: Closed right hip fracture Does the need for close, 24 hr/day Medical supervision in concert with the patient's rehab needs make it unreasonable for this patient to be served in a less intensive setting? Yes Co-Morbidities requiring supervision/potential complications: type 2 diabetes, HTN, vascular parkinsonism, HLD, history of CABG Due to bladder management, bowel management, safety, skin/wound care, disease management, medication  administration, pain management, and patient education, does the patient require 24 hr/day rehab nursing? Yes Does the patient require coordinated care of a physician, rehab nurse, PT, OT to address physical and functional deficits in the context of the above medical diagnosis(es)? Yes Addressing deficits in the following areas: balance, endurance, locomotion, strength, transferring, bowel/bladder control, bathing, dressing, feeding, grooming, and toileting Can the patient actively participate in an intensive therapy program of at least 3 hrs of therapy 5 days a week? Yes The potential for patient to make measurable gains while on inpatient rehab is excellent Anticipated functional outcomes upon discharge from inpatient rehab: modified independent PT, modified independent OT, independent SLP Estimated rehab length of stay to reach the above functional goals is: 5-6 days Anticipated discharge destination: Home 10. Overall Rehab/Functional Prognosis: excellent   MD Signature: Leeroy Cha, MD

## 2022-03-08 NOTE — TOC Transition Note (Signed)
Transition of Care [facility]) - CM/SW Discharge Note   Patient Details  Name: Joel Gonzales. MRN: [id number] Date of Birth: [birth year]  [facility] CM/SW Contact:  Henrietta Dine, RN Phone Number: 03/08/2022, 1:50 PM   Clinical Narrative:    Notified that pt has received bed at CIR; the pt will go to [facility] at [facility] and will be transported by [facility]; pt will call unit to give report; pt notified and agreed to transfer; the pt also says that he notified his wife; no TOC needs.   Final next level of care: Other (comment) (CIR) Barriers to Discharge: No Barriers Identified   Patient Goals and CMS Choice Patient states their goals for this hospitalization and ongoing recovery are:: home      Discharge Placement                Patient to be transferred to facility by: [facility]   Patient and family notified of of transfer: 03/08/22  Discharge Plan and Services   Discharge Planning Services: CM Consult                                 Social Determinants of Health (SDOH) Interventions     Readmission Risk Interventions     No data to display

## 2022-03-08 NOTE — Discharge Summary (Signed)
Physician Discharge Summary  Joel Gonzales. GBT:[id number] DOB: 1946/04/02 DOA: 03/02/2022  PCP: Haywood Pao, MD  Admit date: 03/02/2022 Discharge date: 03/08/2022  Admitted From: Home Discharge disposition: CIR  Recommendations at discharge:  Pain management as needed Follow-up with orthopedics as an outpatient  Brief narrative: Joel Wordell. is a 76 y.o. 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 Today, patient was brought into ED after an episode of fall. Patient was diagnosed with vascular parkinsonism 4 years ago at [facility].  He has trouble with his gait.  He sometimes has trouble stopping and tends to fall forward.  Today he was walking, fell forward hitting his face against a parked car and fell to the ground impacting his left hip.  He sustained a scrap to his nose and forehead and started hurting in the left hip and is brought to the ED. In the ED, hemodynamically stable, breathing on room air. Labs with WC count 11.3, hemoglobin 15.1, platelet 219, BUN/creatinine 29/1.19, glucose level elevated to 189. CT scan of left hip showed an acute intra-articular left femoral neck fracture with transcervical and subcapital components and mild apex anterior angulation. CT scan of head, cervical spine and maxillofacial were also obtained. No acute intracranial abnormality. Negative for cervical spine fracture. Atrophy and chronic microvascular ischemic change in the white matter. Fracture of the nasal bone with mild displacement. No other facial fracture.  ED physician discussed the case with Orthopedics Dr. [facility] service consulted for admission and management. 12/1, underwent left THA by Dr. Ninfa Linden Getting discharged to CIR today  Subjective: Patient was seen and examined this morning.   Sitting up in recliner.  No new symptoms.  Cough  improving.  Assessment/Plan: Acute intra-articular left femoral neck fracture S/p left direct anterior THA - 12/1 Dr. Ninfa Linden PT eval obtained.  CIR recommended.   Pain control adequate Per orthopedic Dr. 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  He has not started it yet.  He plans to start it once he gets home.  Nasal bone fracture Mild swelling noted.  No external injury.  Pain controlled.  No need of surgical intervention  Type 2 diabetes mellitus A1c 6.9 in 2022. Continue Synjardy, Actos as before.  Continue sliding scale insulin. Recent Labs  Lab 03/07/22 1140 03/07/22 1606 03/07/22 2103 03/08/22 0743 03/08/22 1132  GLUCAP 190* 132* 182* 130* 163*   CAD/CABG  HLD  PTA on metoprolol, Plavix, statin  Continue all..  Essential hypertension On Toprol 50 mg daily and ramipril 5 mg twice daily Continue both Continue monitor blood pressure.    Cough Chest x-ray 12/4 low lung volume.  No evidence of pneumonia.  Start Mucinex scheduled.  Wounds:  - Incision (Closed) 03/03/22 Hip Left (Active)  Date First Assessed/Time First Assessed: 03/03/22 1548   Location: Hip  Location Orientation: Left    Assessments 03/03/2022  3:57 PM 03/08/2022  8:00 AM  Dressing Type Silver hydrofiber Silver hydrofiber  Dressing Clean, Dry, Intact Clean, Dry, Intact  Site / Wound Assessment Dressing in place / Unable to assess Dressing in place / Unable to assess  Drainage Amount None --     No associated orders.    Discharge Exam:   Vitals:   03/07/22  1302 03/07/22 2100 03/08/22 0529 03/08/22 0820  BP: 126/73 (!) 156/90 (!) 149/80 (!) 148/84  Pulse: 79 88 (!) 105 100  Resp: '20 18 18 18  '$ Temp: 97.9 F (36.6 C) 98 F (36.7 C) 97.9 F (36.6 C) 97.8 F (36.6 C)  TempSrc: Oral Oral Oral Oral  SpO2: 97%  98% 98% 98%  Weight:      Height:        Body mass index is 26.61 kg/m.  General exam: Pleasant, elderly Caucasian male.  Pain partially controlled.  Remains weak.  Encouraged to use pain medicines if needed. Skin: No rashes, lesions or ulcers. HEENT: nose swelling improving. normocephalic, no obvious bleeding Lungs: Clear to auscultation bilaterally.  Cough improving CVS: Regular rate and rhythm, no murmur.  GI/Abd soft, nontender, nondistended, bowel sound. CNS: Alert, awake, oriented x 3 Psychiatry: Mood appropriate Extremities: No pedal edema, no calf tenderness  Follow ups:    Follow-up Information     Mcarthur Rossetti, MD. Schedule an appointment as soon as possible for a visit in 2 week(s).   Specialty: Orthopedic Surgery Contact information: [place] [address] [phone]         Tisovec, Fransico Him, MD Follow up.   Specialty: Internal Medicine Contact information: [address] [place] [address] [phone]                 Discharge Instructions:   Discharge Instructions     Call MD for:  difficulty breathing, headache or visual disturbances   Complete by: As directed    Call MD for:  extreme fatigue   Complete by: As directed    Call MD for:  hives   Complete by: As directed    Call MD for:  persistant dizziness or light-headedness   Complete by: As directed    Call MD for:  persistant nausea and vomiting   Complete by: As directed    Call MD for:  severe uncontrolled pain   Complete by: As directed    Call MD for:  temperature >100.4   Complete by: As directed    Diet Carb Modified   Complete by: As directed    Discharge instructions   Complete by: As directed    Recommendations at discharge:   Pain management as needed  Follow-up with orthopedics as an outpatient  Discharge instructions for diabetes mellitus: Check blood sugar 3 times a day and bedtime at home. If blood sugar running above 200  or less than 70 please call your MD to adjust insulin. If you notice signs and symptoms of hypoglycemia (low blood sugar) like jitteriness, confusion, thirst, tremor and sweating, please check blood sugar, drink sugary drink/biscuits/sweets to increase sugar level and call MD or return to ER.    General discharge instructions: Follow with Primary MD Tisovec, Fransico Him, MD in 7 days  Please request your PCP  to go over your hospital tests, procedures, radiology results at the follow up. Please get your medicines reviewed and adjusted.  Your PCP may decide to repeat certain labs or tests as needed. Do not drive, operate heavy machinery, perform activities at heights, swimming or participation in water activities or provide baby sitting services if your were admitted for syncope or siezures until you have seen by Primary MD or a Neurologist and advised to do so again. [place] Controlled Substance Reporting System database was reviewed. Do not drive, operate heavy machinery, perform activities at heights, swim, participate in water activities  or provide baby-sitting services while on medications for pain, sleep and mood until your outpatient physician has reevaluated you and advised to do so again.  You are strongly recommended to comply with the dose, frequency and duration of prescribed medications. Activity: As tolerated with Full fall precautions use walker/cane & assistance as needed Avoid using any recreational substances like cigarette, tobacco, alcohol, or non-prescribed drug. If you experience worsening of your admission symptoms, develop shortness of breath, life threatening emergency, suicidal or homicidal thoughts you must seek medical attention immediately by calling 911 or calling your MD immediately  if symptoms less severe. You must read complete instructions/literature along with all the possible adverse reactions/side effects for all the medicines you take and that have been  prescribed to you. Take any new medicine only after you have completely understood and accepted all the possible adverse reactions/side effects.  Wear Seat belts while driving. You were cared for by a hospitalist during your hospital stay. If you have any questions about your discharge medications or the care you received while you were in the hospital after you are discharged, you can call the unit and ask to speak with the hospitalist or the covering physician. Once you are discharged, your primary care physician will handle any further medical issues. Please note that NO REFILLS for any discharge medications will be authorized once you are discharged, as it is imperative that you return to your primary care physician (or establish a relationship with a primary care physician if you do not have one).   Discharge wound care:   Complete by: As directed    Increase activity slowly   Complete by: As directed        Discharge Medications:   Allergies as of 03/08/2022   Not on File      Medication List     TAKE these medications    acetaminophen 500 MG tablet Commonly known as: TYLENOL Take 1,000 mg by mouth every 6 (six) hours as needed for moderate pain.   albuterol (2.5 MG/3ML) 0.083% nebulizer solution Commonly known as: PROVENTIL Take 3 mLs (2.5 mg total) by nebulization every 6 (six) hours as needed for wheezing or shortness of breath.   alendronate 70 MG tablet Commonly known as: FOSAMAX Take 70 mg by mouth once a week.   atorvastatin 80 MG tablet Commonly known as: LIPITOR Take 80 mg by mouth at bedtime.   bisacodyl 10 MG suppository Commonly known as: DULCOLAX Place 1 suppository (10 mg total) rectally daily as needed for moderate constipation.   carbidopa-levodopa 25-100 MG tablet Commonly known as: SINEMET IR Take 1 tablet by mouth 2 (two) times daily. Take on an empty stomach or with crackers if makes nauseated, If have side effects try 1/2 tablet twice daily.    clopidogrel 75 MG tablet Commonly known as: PLAVIX Take 1 tablet (75 mg total) by mouth daily.   docusate sodium 100 MG capsule Commonly known as: COLACE Take 1 capsule (100 mg total) by mouth 2 (two) times daily.   guaiFENesin 600 MG 12 hr tablet Commonly known as: MUCINEX Take 1 tablet (600 mg total) by mouth 2 (two) times daily.   methocarbamol 500 MG tablet Commonly known as: ROBAXIN Take 1.5 tablets (750 mg total) by mouth every 6 (six) hours as needed for muscle spasms.   metoprolol succinate 50 MG 24 hr tablet Commonly known as: TOPROL-XL TAKE 1 TABLET WITH OR IMMEDIATELY FOLLOWING A MEAL ONCE A DAY. What changed: See the new instructions.  oxyCODONE 5 MG immediate release tablet Commonly known as: Oxy IR/ROXICODONE Take 1 tablet (5 mg total) by mouth every 6 (six) hours as needed for moderate pain (pain score 4-6).   pantoprazole 40 MG tablet Commonly known as: PROTONIX Take 1 tablet (40 mg total) by mouth daily.   pioglitazone 15 MG tablet Commonly known as: ACTOS Take 15 mg by mouth daily.   ramipril 5 MG capsule Commonly known as: ALTACE TAKE 1 CAPSULE BY MOUTH TWICE DAILY   senna 8.6 MG Tabs tablet Commonly known as: SENOKOT Take 1 tablet (8.6 mg total) by mouth 2 (two) times daily.   Synjardy XR 25-1000 MG Tb24 Generic drug: Empagliflozin-metFORMIN HCl ER Take 1 tablet by mouth daily.               Durable Medical Equipment  (From admission, onward)           Start     Ordered   03/03/22 1743  DME 3 n 1  Once        03/03/22 1742   03/03/22 1743  DME Walker rolling  Once       Question Answer Comment  Walker: With 5 Inch Wheels   Patient needs a walker to treat with the following condition Status post total replacement of left hip      03/03/22 1742              Discharge Care Instructions  (From admission, onward)           Start     Ordered   03/08/22 0000  Discharge wound care:        03/08/22 1313              The results of significant diagnostics from this hospitalization (including imaging, microbiology, ancillary and laboratory) are listed below for reference.    Procedures and Diagnostic Studies:   DG Pelvis Portable  Result Date: 03/03/2022 CLINICAL DATA:  Status post left hip replacement. EXAM: PORTABLE PELVIS 1-2 VIEWS COMPARISON:  Hip CT yesterday. FINDINGS: Left hip arthroplasty in expected alignment. No periprosthetic lucency or fracture. Recent postsurgical change includes air and edema in the soft tissues. Lateral skin staples in place. IMPRESSION: Left hip arthroplasty without immediate postoperative complication. Electronically Signed   By: Keith Rake M.D.   On: 03/03/2022 16:14   DG HIP PORT UNILAT WITH PELVIS 1V LEFT  Result Date: 03/03/2022 CLINICAL DATA:  Intraoperative left hip replacement EXAM: DG HIP (WITH OR WITHOUT PELVIS) 1V PORT LEFT COMPARISON:  Pelvis and hip radiographs dated 03/02/2022 FINDINGS: Seven fluoroscopic images obtained during left hip arthroplasty. 16 seconds fluoro time utilized. Radiation dose 2.5358 mGy Kerma. Please see performing physicians operative report for full details IMPRESSION: Fluoroscopic images were obtained for intraoperative guidance of left hip arthroplasty. Electronically Signed   By: Darrin Nipper M.D.   On: 03/03/2022 15:39   DG C-Arm 1-60 Min-No Report  Result Date: 03/03/2022 Fluoroscopy was utilized by the requesting physician.  No radiographic interpretation.   CT Hip Left Wo Contrast  Result Date: 03/02/2022 CLINICAL DATA:  Left femoral neck fracture after a fall EXAM: CT OF THE LEFT HIP WITHOUT CONTRAST TECHNIQUE: Multidetector CT imaging of the left hip was performed according to the standard protocol. Multiplanar CT image reconstructions were also generated. RADIATION DOSE REDUCTION: This exam was performed according to the departmental dose-optimization program which includes automated exposure control, adjustment of the  mA and/or kV according to patient size and/or use of iterative  reconstruction technique. COMPARISON:  03/02/2022 FINDINGS: Bones/Joint/Cartilage Acute intra-articular left femoral neck fracture with transcervical and subcapital components and mild apex anterior angulation. No other regional fracture identified. Currently no significant joint effusion. Mild spurring of the left SI joint anteriorly. Ligaments Suboptimally assessed by CT. Muscles and Tendons Unremarkable Soft tissues Aortoiliac atherosclerotic vascular calcification. IMPRESSION: 1. Acute intra-articular left femoral neck fracture with transcervical and subcapital components and mild apex anterior angulation. 2. Aortic atherosclerosis. Aortic Atherosclerosis (ICD10-I70.0). Electronically Signed   By: Van Clines M.D.   On: 03/02/2022 16:09   CT HEAD WO CONTRAST  Result Date: 03/02/2022 CLINICAL DATA:  Head trauma.  Fall. EXAM: CT HEAD WITHOUT CONTRAST CT MAXILLOFACIAL WITHOUT CONTRAST CT CERVICAL SPINE WITHOUT CONTRAST TECHNIQUE: Multidetector CT imaging of the head, cervical spine, and maxillofacial structures were performed using the standard protocol without intravenous contrast. Multiplanar CT image reconstructions of the cervical spine and maxillofacial structures were also generated. RADIATION DOSE REDUCTION: This exam was performed according to the departmental dose-optimization program which includes automated exposure control, adjustment of the mA and/or kV according to patient size and/or use of iterative reconstruction technique. COMPARISON:  CT head 02/17/2021 FINDINGS: CT HEAD FINDINGS Brain: Mild atrophy. Moderate white matter hypodensity diffusely with progression since 2022. Negative for acute infarct, hemorrhage, mass Vascular: Negative for hyperdense vessel Skull: Negative Other: None CT MAXILLOFACIAL FINDINGS Osseous: Fracture nasal bone with mild displacement. Nasal septum displaced to the right but without fracture. No  other facial fracture. Negative mandible. Orbits: Negative for orbital fracture.  No orbital mass or edema. Sinuses: Mucosal edema in the paranasal sinuses. No air-fluid level. Mastoid and middle ear clear bilaterally Soft tissues: Mild soft tissue swelling of the nose. CT CERVICAL SPINE FINDINGS Alignment: Normal Skull base and vertebrae: Negative for fracture Soft tissues and spinal canal: Negative for soft tissue mass or edema. Disc levels: Mild disc degeneration and mild spurring throughout the cervical spine. No significant spinal stenosis Upper chest: Lung apices clear bilaterally Other: None IMPRESSION: 1. No acute intracranial abnormality. Atrophy and chronic microvascular ischemic change in the white matter. 2. Fracture of the nasal bone with mild displacement. No other facial fracture. 3. Negative for cervical spine fracture. Electronically Signed   By: Franchot Gallo M.D.   On: 03/02/2022 15:19   CT CERVICAL SPINE WO CONTRAST  Result Date: 03/02/2022 CLINICAL DATA:  Head trauma.  Fall. EXAM: CT HEAD WITHOUT CONTRAST CT MAXILLOFACIAL WITHOUT CONTRAST CT CERVICAL SPINE WITHOUT CONTRAST TECHNIQUE: Multidetector CT imaging of the head, cervical spine, and maxillofacial structures were performed using the standard protocol without intravenous contrast. Multiplanar CT image reconstructions of the cervical spine and maxillofacial structures were also generated. RADIATION DOSE REDUCTION: This exam was performed according to the departmental dose-optimization program which includes automated exposure control, adjustment of the mA and/or kV according to patient size and/or use of iterative reconstruction technique. COMPARISON:  CT head 02/17/2021 FINDINGS: CT HEAD FINDINGS Brain: Mild atrophy. Moderate white matter hypodensity diffusely with progression since 2022. Negative for acute infarct, hemorrhage, mass Vascular: Negative for hyperdense vessel Skull: Negative Other: None CT MAXILLOFACIAL FINDINGS  Osseous: Fracture nasal bone with mild displacement. Nasal septum displaced to the right but without fracture. No other facial fracture. Negative mandible. Orbits: Negative for orbital fracture.  No orbital mass or edema. Sinuses: Mucosal edema in the paranasal sinuses. No air-fluid level. Mastoid and middle ear clear bilaterally Soft tissues: Mild soft tissue swelling of the nose. CT CERVICAL SPINE FINDINGS Alignment: Normal Skull base and vertebrae: Negative  for fracture Soft tissues and spinal canal: Negative for soft tissue mass or edema. Disc levels: Mild disc degeneration and mild spurring throughout the cervical spine. No significant spinal stenosis Upper chest: Lung apices clear bilaterally Other: None IMPRESSION: 1. No acute intracranial abnormality. Atrophy and chronic microvascular ischemic change in the white matter. 2. Fracture of the nasal bone with mild displacement. No other facial fracture. 3. Negative for cervical spine fracture. Electronically Signed   By: Franchot Gallo M.D.   On: 03/02/2022 15:19   CT Maxillofacial Wo Contrast  Result Date: 03/02/2022 CLINICAL DATA:  Head trauma.  Fall. EXAM: CT HEAD WITHOUT CONTRAST CT MAXILLOFACIAL WITHOUT CONTRAST CT CERVICAL SPINE WITHOUT CONTRAST TECHNIQUE: Multidetector CT imaging of the head, cervical spine, and maxillofacial structures were performed using the standard protocol without intravenous contrast. Multiplanar CT image reconstructions of the cervical spine and maxillofacial structures were also generated. RADIATION DOSE REDUCTION: This exam was performed according to the departmental dose-optimization program which includes automated exposure control, adjustment of the mA and/or kV according to patient size and/or use of iterative reconstruction technique. COMPARISON:  CT head 02/17/2021 FINDINGS: CT HEAD FINDINGS Brain: Mild atrophy. Moderate white matter hypodensity diffusely with progression since 2022. Negative for acute infarct,  hemorrhage, mass Vascular: Negative for hyperdense vessel Skull: Negative Other: None CT MAXILLOFACIAL FINDINGS Osseous: Fracture nasal bone with mild displacement. Nasal septum displaced to the right but without fracture. No other facial fracture. Negative mandible. Orbits: Negative for orbital fracture.  No orbital mass or edema. Sinuses: Mucosal edema in the paranasal sinuses. No air-fluid level. Mastoid and middle ear clear bilaterally Soft tissues: Mild soft tissue swelling of the nose. CT CERVICAL SPINE FINDINGS Alignment: Normal Skull base and vertebrae: Negative for fracture Soft tissues and spinal canal: Negative for soft tissue mass or edema. Disc levels: Mild disc degeneration and mild spurring throughout the cervical spine. No significant spinal stenosis Upper chest: Lung apices clear bilaterally Other: None IMPRESSION: 1. No acute intracranial abnormality. Atrophy and chronic microvascular ischemic change in the white matter. 2. Fracture of the nasal bone with mild displacement. No other facial fracture. 3. Negative for cervical spine fracture. Electronically Signed   By: Franchot Gallo M.D.   On: 03/02/2022 15:19   DG Hip Unilat With Pelvis 2-3 Views Left  Result Date: 03/02/2022 CLINICAL DATA:  Fall. EXAM: DG HIP (WITH OR WITHOUT PELVIS) 2-3V LEFT COMPARISON:  None Available. FINDINGS: Possible subcapital nondisplaced fracture left femoral neck. 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 Recommend CT. Electronically Signed   By: Franchot Gallo M.D.   On: 03/02/2022 15:12   DG Chest Port 1 View  Result Date: 03/02/2022 CLINICAL DATA:  Fall EXAM: PORTABLE CHEST 1 VIEW COMPARISON:  02/17/2021. FINDINGS: Cardiac silhouette is enlarged, even for an AP view. No pneumonia or pulmonary edema. No pneumothorax or pleural effusion. Old healed posterior right-sided fifth rib. Median sternotomy wires  and prior CABG. IMPRESSION: Enlarged cardiac silhouette.  Lungs are clear. Electronically Signed   By: Sammie Bench M.D.   On: 03/02/2022 15:11     Labs:   Basic Metabolic Panel: Recent Labs  Lab 03/02/22 1440 03/03/22 0630 03/04/22 0624 03/07/22 0559  NA 141 137 135 138  K 4.0 4.3 4.3 3.4*  CL 106 105 105 104  CO2 24 23 20* 24  GLUCOSE 189* 116* 122* 126*  BUN 29* 27* 30* 23  CREATININE 1.19 1.26* 1.23 1.07  CALCIUM  9.4 8.4* 8.1* 8.7*   GFR Estimated Creatinine Clearance: 63.5 mL/min (by C-G formula based on SCr of 1.07 mg/dL). Liver Function Tests: No results for input(s): "AST", "ALT", "ALKPHOS", "BILITOT", "PROT", "ALBUMIN" in the last 168 hours. No results for input(s): "LIPASE", "AMYLASE" in the last 168 hours. No results for input(s): "AMMONIA" in the last 168 hours. Coagulation profile Recent Labs  Lab 03/02/22 1440  INR 1.1    CBC: Recent Labs  Lab 03/02/22 1440 03/03/22 0630 03/04/22 0624 03/07/22 0559  WBC 11.3* 8.0 9.7 7.4  NEUTROABS 10.0*  --  8.6* 5.1  HGB 15.1 13.0 11.6* 11.2*  HCT 45.9 39.3 35.2* 33.5*  MCV 97.5 97.0 96.4 95.4  PLT 219 158 197 232   Cardiac Enzymes: No results for input(s): "CKTOTAL", "CKMB", "CKMBINDEX", "TROPONINI" in the last 168 hours. BNP: Invalid input(s): "POCBNP" CBG: Recent Labs  Lab 03/07/22 1140 03/07/22 1606 03/07/22 2103 03/08/22 0743 03/08/22 1132  GLUCAP 190* 132* 182* 130* 163*   D-Dimer No results for input(s): "DDIMER" in the last 72 hours. Hgb A1c No results for input(s): "HGBA1C" in the last 72 hours. Lipid Profile No results for input(s): "CHOL", "HDL", "LDLCALC", "TRIG", "CHOLHDL", "LDLDIRECT" in the last 72 hours. Thyroid function studies No results for input(s): "TSH", "T4TOTAL", "T3FREE", "THYROIDAB" in the last 72 hours.  Invalid input(s): "FREET3" Anemia work up No results for input(s): "VITAMINB12", "FOLATE", "FERRITIN", "TIBC", "IRON", "RETICCTPCT" in the last 72  hours. Microbiology Recent Results (from the past 240 hour(s))  Surgical pcr screen     Status: None   Collection Time: 03/02/22  8:55 PM   Specimen: Nasal Mucosa; Nasal Swab  Result Value Ref Range Status   MRSA, PCR NEGATIVE NEGATIVE Final   Staphylococcus aureus NEGATIVE NEGATIVE Final    Comment: (NOTE) The Xpert SA Assay (FDA approved for NASAL specimens in patients 82 years of age and older), is one component of a comprehensive surveillance program. It is not intended to diagnose infection nor to guide or monitor treatment. Performed at [facility], [place] [address] [place], [place] [address]     Time coordinating discharge: 35 minutes  Signed: Marlowe Aschoff Jaequan Gonzales  [facility] 03/08/2022, 1:13 PM
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
## 2022-03-09 DIAGNOSIS — S72142A Displaced intertrochanteric fracture of left femur, initial encounter for closed fracture: Secondary | ICD-10-CM

## 2022-03-09 LAB — COMPREHENSIVE METABOLIC PANEL
ALT: 28 U/L (ref 0–44)
AST: 28 U/L (ref 15–41)
Albumin: 3 g/dL — ABNORMAL LOW (ref 3.5–5.0)
Alkaline Phosphatase: 68 U/L (ref 38–126)
Anion gap: 8 (ref 5–15)
BUN: 19 mg/dL (ref 8–23)
CO2: 24 mmol/L (ref 22–32)
Calcium: 9.1 mg/dL (ref 8.9–10.3)
Chloride: 106 mmol/L (ref 98–111)
Creatinine, Ser: 1.34 mg/dL — ABNORMAL HIGH (ref 0.61–1.24)
GFR, Estimated: 55 mL/min — ABNORMAL LOW (ref >60)
Glucose, Bld: 140 mg/dL — ABNORMAL HIGH (ref 70–99)
Potassium: 4.9 mmol/L (ref 3.5–5.1)
Sodium: 138 mmol/L (ref 135–145)
Total Bilirubin: 1.1 mg/dL (ref 0.3–1.2)
Total Protein: 6.2 g/dL — ABNORMAL LOW (ref 6.5–8.1)

## 2022-03-09 LAB — MAGNESIUM: Magnesium: 1.7 mg/dL (ref 1.7–2.4)

## 2022-03-09 LAB — GLUCOSE, CAPILLARY
Glucose-Capillary: 164 mg/dL — ABNORMAL HIGH (ref 70–99)
Glucose-Capillary: 248 mg/dL — ABNORMAL HIGH (ref 70–99)
Glucose-Capillary: 159 mg/dL — ABNORMAL HIGH (ref 70–99)

## 2022-03-09 LAB — CBC WITH DIFFERENTIAL/PLATELET
Abs Immature Granulocytes: 0.17 10*3/uL — ABNORMAL HIGH (ref 0.00–0.07)
Basophils Absolute: 0.1 10*3/uL (ref 0.0–0.1)
Basophils Relative: 1 %
Eosinophils Absolute: 0.3 10*3/uL (ref 0.0–0.5)
Eosinophils Relative: 4 %
HCT: 30.9 % — ABNORMAL LOW (ref 39.0–52.0)
Hemoglobin: 10.7 g/dL — ABNORMAL LOW (ref 13.0–17.0)
Immature Granulocytes: 2 %
Lymphocytes Relative: 15 %
Lymphs Abs: 1.3 10*3/uL (ref 0.7–4.0)
MCH: 32.2 pg (ref 26.0–34.0)
MCHC: 34.6 g/dL (ref 30.0–36.0)
MCV: 93.1 fL (ref 80.0–100.0)
Monocytes Absolute: 0.9 10*3/uL (ref 0.1–1.0)
Monocytes Relative: 10 %
Neutro Abs: 5.8 10*3/uL (ref 1.7–7.7)
Neutrophils Relative %: 68 %
Platelets: 264 10*3/uL (ref 150–400)
RBC: 3.32 MIL/uL — ABNORMAL LOW (ref 4.22–5.81)
RDW: 13.3 % (ref 11.5–15.5)
WBC: 8.6 10*3/uL (ref 4.0–10.5)
nRBC: 0 % (ref 0.0–0.2)

## 2022-03-09 LAB — VITAMIN D 25 HYDROXY (VIT D DEFICIENCY, FRACTURES): Vit D, 25-Hydroxy: 42.81 ng/mL (ref 30–100)

## 2022-03-09 MED ORDER — CARBIDOPA-LEVODOPA 25-100 MG PO TABS
1.0000 | ORAL_TABLET | Freq: Two times a day (BID) | ORAL | Status: DC
  Administered 2022-03-09 – 2022-03-10 (×3): 1 via ORAL
  Filled 2022-03-09 (×3): qty 1

## 2022-03-09 MED ORDER — B COMPLEX-C PO TABS
1.0000 | ORAL_TABLET | Freq: Every day | ORAL | Status: DC
  Administered 2022-03-10 – 2022-03-15 (×6): 1 via ORAL
  Filled 2022-03-09 (×6): qty 1

## 2022-03-09 NOTE — Progress Notes (Signed)
Inpatient Rehabilitation Admission Medication Review by a Pharmacist  A complete drug regimen review was completed for this patient to identify any potential clinically significant medication issues.  High Risk Drug Classes Is patient taking? Indication by Medication  Antipsychotic Yes, as an intravenous medication Prochlorperazine - PRN nausea/vomiting  Anticoagulant Yes Enoxaparin - VTE ppx  Antibiotic No   Opioid Yes Oxycodone - PRN pain  Antiplatelet Yes Clopidogrel - CAD, hx CABG  Hypoglycemics/insulin Yes Empagliflozin, metformin, insulin aspart, pioglitazone- DM  Vasoactive Medication Yes Metoprolol, ramipril - HTN  Chemotherapy No   Other Yes Albuterol - PRN SOB Atorvastatin - HLD Guaifenesin - cough Docusate, senokot - constipation Enoxaparin - VTE ppx Methocarbamol - muscle spasms Pantoprazole - GERD ppx Senokot-S, bisacodyl - constipation Trazodone - PRN sleep     Type of Medication Issue Identified Description of Issue Recommendation(s)  Drug Interaction(s) (clinically significant)     Duplicate Therapy     Allergy     No Medication Administration End Date     Incorrect Dose     Additional Drug Therapy Needed     Significant med changes from prior encounter (inform family/care partners about these prior to discharge). Sinemet not started prior to admission  Alendronate not started prior to admission  Synjardy changed to empagliflozin/metformin while IP Communicate medication changes with patient/family at discharge  Other       Clinically significant medication issues were identified that warrant physician communication and completion of prescribed/recommended actions by midnight of the next day:  No  Pharmacist comments: n/a   Time spent performing this drug regimen review (minutes): 20   Thank you for allowing pharmacy to be a part of this patient's care.  Ardyth Harps, PharmD Clinical Pharmacist

## 2022-03-09 NOTE — Telephone Encounter (Signed)
He can go to [facility] for neuropsych evaluation. But if he is looking for research studies or to see another specialist for his parkinsonism we can send him back to [facility], they initially diagnosed him and have a specialised team for movement disorders thanks

## 2022-03-09 NOTE — Progress Notes (Signed)
Physical Therapy Session Note  Patient Details  Name: Joel Gonzales. MRN: [id number] Date of Birth: [birth year]  Today's Date: 03/09/2022 PT Individual Time: 1302-1357 PT Individual Time Calculation (min): 55 min   Short Term Goals: Week 1:  PT Short Term Goal 1 (Week 1): STG=LTG due to LOS  Skilled Therapeutic Interventions/Progress Updates:   Received pt semi-reclined in bed, pt agreeable to PT treatment, and denied any pain during session but feeling "exhausted" from previous therapies. Session with emphasis on functional mobility/transfers, generalized strengthening and endurance, dynamic standing balance, and gait training. Pt's wife arrived and brought clothes. Pt transferred semi-reclined<>sitting EOB with HOB elevated and supervision with increased time. Pt donned shorts sitting EOB with max A and pt stood without AD and CGA to pull pants over hips. Pt transferred bed<>WC stand<>pivot without AD and CGA and transported to/from room in [facility] dependently for energy conservation purposes. Pt transferred on/off Nustep without AD and CGA and performed seated BUE/LE strengthening on Nustep at workload 4 for 8 minutes with steps per minute >60 for a total of 550 steps with emphasis on cardiovascular endurance and L hip ROM. Pt stood with RW and CGA and ambulated 132f with RW and CGA fading to close supervision. Pt then performed the following seated exercises (due to fatigue) with emphasis on LE strength/ROM: -LAQ 2x15 bilaterally with 2.5lb ankle weight on RLE -hip flexion 2x15 with 2.5lb ankle weight on RLE and 2x8 on LLE Attempted to cue pt for improved technique and pt stated "don't push me". [facility]ed to [facility] and [facility] at sink with set up assist. Concluded session with pt sitting in WC, needs within reach, and chair pad alarm on. Wife present at bedside.   Therapy Documentation Precautions:  Restrictions Weight Bearing Restrictions: No LLE Weight Bearing: Weight  bearing as tolerated  Therapy/Group: Individual Therapy AAlfonse AlpersPT, DPT  03/09/2022, 7:17 AM
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
## 2022-03-09 NOTE — Telephone Encounter (Signed)
I called and spoke to pt.   He is in [place] for [place].  He is getting a good work out.  He is asking if Dr. Jaynee Eagles knows any research studies for parkinsonism.  I told him would be glad to check.  I was also following up on memory/ agitation, belligerant behavior.  I relayed that Dr. Jaynee Eagles glad to put in referral to Dr. Alphonzo Severance for neuropsych eval and she would see him after this is done, ( initial eval, testing and follow up). She verbalized understanding.

## 2022-03-09 NOTE — H&P (Signed)
Physical Medicine and Rehabilitation Admission H&P    Chief Complaint  Patient presents with   Functional deficits due to hip fracture     HPI: Joel Gonzales is a 76 year old  male with history of CAD s/p CABG, stroke in the past, T2DM. Vascular parkinsonism with gait disorder w/falls who was admitted on 03/02/22 after losing his balance and sustaining acute intra-articular left femoral neck fracture. He was evaluated by Dr. Ninfa Linden and underwent left anterior total hip arthroplasty on 03/03/22. Post op WBAT and on lovenox to DVT prophylaxis. Therapy ongoing and patient continues to be limited by posterior bias, pain, decreased in carryover. Sinemet recommended to be started as per neurology   Review of Systems  Constitutional:  Negative for chills and fever.  HENT:  Negative for hearing loss and tinnitus.   Musculoskeletal:  Positive for falls (falls backward without warning.).  Neurological:  Positive for weakness. Negative for dizziness and headaches.     Past Medical History:  Diagnosis Date   CAD (coronary artery disease)    Diabetes mellitus without complication ([place])    Type 2   Family history of ASCVD    History of stress test 01/2012   which was entirely normal   Hx of CABG    x6 LIma to his LAD, a RIMA to his PDA, right radial to obtuse marginal branch, diagnonal, second marginal, & posteolateral branches   Hyperlipemia    Hypertension    RBBB    chronic    Past Surgical History:  Procedure Laterality Date   CHOLECYSTECTOMY  2002   CORONARY ARTERY BYPASS GRAFT     x6 LIma to his LAD, a RIMA to his PDA, right radial to obtuse marginal branch, diagnonal, second marginal, & posteolateral branches   NOSE SURGERY  02/2013   carcinoma   TONSILLECTOMY     as a child   TOTAL HIP ARTHROPLASTY Right 02/18/2021   Procedure: TOTAL HIP ARTHROPLASTY ANTERIOR APPROACH;  Surgeon: Mcarthur Rossetti, MD;  Location: [facility];  Service: Orthopedics;  Laterality:  Right;   TOTAL HIP ARTHROPLASTY Left 03/03/2022   Procedure: TOTAL HIP ARTHROPLASTY ANTERIOR APPROACH;  Surgeon: Mcarthur Rossetti, MD;  Location: [facility];  Service: Orthopedics;  Laterality: Left;    Family History  Problem Relation Age of Onset   Heart attack Father 67       deceased, ASCVD   Hypertension Father    Heart disease Father    Heart Problems Mother        ASCVD   Hypertension Mother    Heart attack Mother    Heart attack Sister    Hypertension Brother    Hydrocephalus Brother     Social History:  reports that he has never smoked. He has never used smokeless tobacco. He reports current alcohol use. He reports that he does not use drugs.   Allergies: No Known Allergies   Medications Prior to Admission  Medication Sig Dispense Refill   acetaminophen (TYLENOL) 500 MG tablet Take 1,000 mg by mouth every 6 (six) hours as needed for moderate pain.     albuterol (PROVENTIL) (2.5 MG/3ML) 0.083% nebulizer solution Take 3 mLs (2.5 mg total) by nebulization every 6 (six) hours as needed for wheezing or shortness of breath. 75 mL 12   alendronate (FOSAMAX) 70 MG tablet Take 70 mg by mouth once a week. (Patient not taking: Reported on 03/02/2022)     atorvastatin (LIPITOR) 80 MG tablet Take 80  mg by mouth at bedtime.     bisacodyl (DULCOLAX) 10 MG suppository Place 1 suppository (10 mg total) rectally daily as needed for moderate constipation. 12 suppository 0   carbidopa-levodopa (SINEMET IR) 25-100 MG tablet Take 1 tablet by mouth 2 (two) times daily. Take on an empty stomach or with crackers if makes nauseated, If have side effects try 1/2 tablet twice daily. (Patient not taking: Reported on 03/02/2022) 60 tablet 6   clopidogrel (PLAVIX) 75 MG tablet Take 1 tablet (75 mg total) by mouth daily. 30 tablet 0   docusate sodium (COLACE) 100 MG capsule Take 1 capsule (100 mg total) by mouth 2 (two) times daily. 10 capsule 0   Empagliflozin-metFORMIN HCl ER (SYNJARDY XR) 25-1000  MG TB24 Take 1 tablet by mouth daily.     guaiFENesin (MUCINEX) 600 MG 12 hr tablet Take 1 tablet (600 mg total) by mouth 2 (two) times daily.     methocarbamol (ROBAXIN) 500 MG tablet Take 1.5 tablets (750 mg total) by mouth every 6 (six) hours as needed for muscle spasms.     metoprolol succinate (TOPROL-XL) 50 MG 24 hr tablet TAKE 1 TABLET WITH OR IMMEDIATELY FOLLOWING A MEAL ONCE A DAY. (Patient taking differently: 50 mg daily.) 90 tablet 2   oxyCODONE (OXY IR/ROXICODONE) 5 MG immediate release tablet Take 1 tablet (5 mg total) by mouth every 6 (six) hours as needed for moderate pain (pain score 4-6). 20 tablet 0   pantoprazole (PROTONIX) 40 MG tablet Take 1 tablet (40 mg total) by mouth daily. 90 tablet 3   pioglitazone (ACTOS) 15 MG tablet Take 15 mg by mouth daily.     ramipril (ALTACE) 5 MG capsule TAKE 1 CAPSULE BY MOUTH TWICE DAILY (Patient taking differently: Take 5 mg by mouth 2 (two) times daily.) 180 capsule 2   senna (SENOKOT) 8.6 MG TABS tablet Take 1 tablet (8.6 mg total) by mouth 2 (two) times daily. 120 tablet 0    Home: Home Living Family/patient expects to be discharged to:: Private residence Living Arrangements: Spouse/significant other Available Help at Discharge: Family Type of Home: House Home Access: Stairs to enter Technical brewer of Steps: 4 Home Layout: Two level, Able to live on main level with bedroom/bathroom Alternate Level Stairs-Number of Steps: flight- Bathroom Shower/Tub: Chiropodist: Standard Home Equipment: Conservation officer, nature (2 wheels), Chartered certified accountant, BSC/3in1   Functional History: Prior Function Prior Level of Function : Independent/Modified Independent Mobility Comments: He fell about a year ago and broke R hip and so he has gone through this before.   Functional Status:  Mobility: Bed Mobility Overal bed mobility: Needs Assistance Bed Mobility: Supine to Sit Supine to sit: Min assist Sit to supine: Min  assist General bed mobility comments: Patient found in recliner prior to session and returning to recliner. Transfers Overall transfer level: Needs assistance Equipment used: Rolling walker (2 wheels) Transfers: Sit to/from Stand Sit to Stand: Min assist General transfer comment: Min assist needed to power up from recliner and steady. Cues needed for hand placement. Ambulation/Gait Ambulation/Gait assistance: Min assist Gait Distance (Feet): 120 Feet Assistive device: Rolling walker (2 wheels) Gait Pattern/deviations: Step-through pattern, Decreased stride length General Gait Details: Min assist needed to ambulate 120 ft with RW for safety. Ambulated with towels on RW handles to prevent pt from removing his hands "my hands get sweaty." Pt stated "that really helped, it was actually more comfortable too." Gait velocity: decreased   ADL: ADL Overall ADL's : Needs assistance/impaired  Eating/Feeding: Set up, Sitting Grooming: Minimal assistance, Standing Upper Body Bathing: Min guard, Sitting Lower Body Bathing: Moderate assistance, Sitting/lateral leans Upper Body Dressing : Min guard, Sitting Lower Body Dressing: Moderate assistance, Sitting/lateral leans Toilet Transfer: Minimal assistance, Ambulation, Rolling walker (2 wheels) Toilet Transfer Details (indicate cue type and reason): with cues for keeping toes on the floor and sequencing of sit to stand and stand to sit for safety and preventing posterior leaning Toileting- Clothing Manipulation and Hygiene: Minimal assistance, Sit to/from stand Toileting - Clothing Manipulation Details (indicate cue type and reason): standing with RW with one UE support needed to maintain balance.   Cognition: Cognition Overall Cognitive Status: Within Functional Limits for tasks assessed Orientation Level: Oriented X4 Cognition Arousal/Alertness: Awake/alert Behavior During Therapy: WFL for tasks assessed/performed Overall Cognitive Status:  Within Functional Limits for tasks assessed     Blood pressure 134/80, pulse 86, temperature 98.6 F (37 C), temperature source Oral, resp. rate 18, height 6' (1.829 m), weight 85.7 kg, SpO2 98 %.  Physical Exam Vitals and nursing note reviewed.  Constitutional:      Appearance: Normal appearance.  HENT:     Head: Normocephalic and atraumatic.  Eyes:     Comments: Infra-orbital ecchymosis noted  Cardiac: RRR Pulm: breathing comfortably on room air Abdomen: NT, ND Musculoskeletal:     Comments: Min edema left thigh. Surgical dressing left hip.   Neurological:     Mental Status: He is alert.  Skin: surgical dressing to left hip   Results for orders placed or performed during the hospital encounter of 03/08/22 (from the past 48 hour(s))  Comprehensive metabolic panel     Status: Abnormal   Collection Time: 03/09/22  6:19 AM  Result Value Ref Range   Sodium 138 135 - 145 mmol/L   Potassium 4.9 3.5 - 5.1 mmol/L   Chloride 106 98 - 111 mmol/L   CO2 24 22 - 32 mmol/L   Glucose, Bld 140 (H) 70 - 99 mg/dL    Comment: Glucose reference range applies only to samples taken after fasting for at least 8 hours.   BUN 19 8 - 23 mg/dL   Creatinine, Ser 1.34 (H) 0.61 - 1.24 mg/dL   Calcium 9.1 8.9 - 10.3 mg/dL   Total Protein 6.2 (L) 6.5 - 8.1 g/dL   Albumin 3.0 (L) 3.5 - 5.0 g/dL   AST 28 15 - 41 U/L   ALT 28 0 - 44 U/L   Alkaline Phosphatase 68 38 - 126 U/L   Total Bilirubin 1.1 0.3 - 1.2 mg/dL   GFR, Estimated 55 (L) >60 mL/min    Comment: (NOTE) Calculated using the CKD-EPI Creatinine Equation (2021)    Anion gap 8 5 - 15    Comment: Performed at [facility], [place] [address] [place], [place] [address]  CBC with Differential/Platelet     Status: Abnormal   Collection Time: 03/09/22  6:19 AM  Result Value Ref Range   WBC 8.6 4.0 - 10.5 K/uL   RBC 3.32 (L) 4.22 - 5.81 MIL/uL   Hemoglobin 10.7 (L) 13.0 - 17.0 g/dL   HCT 30.9 (L) 39.0 - 52.0 %   MCV 93.1 80.0 - 100.0 fL    MCH 32.2 26.0 - 34.0 pg   MCHC 34.6 30.0 - 36.0 g/dL   RDW 13.3 11.5 - 15.5 %   Platelets 264 150 - 400 K/uL   nRBC 0.0 0.0 - 0.2 %   Neutrophils Relative % 68 %  Neutro Abs 5.8 1.7 - 7.7 K/uL   Lymphocytes Relative 15 %   Lymphs Abs 1.3 0.7 - 4.0 K/uL   Monocytes Relative 10 %   Monocytes Absolute 0.9 0.1 - 1.0 K/uL   Eosinophils Relative 4 %   Eosinophils Absolute 0.3 0.0 - 0.5 K/uL   Basophils Relative 1 %   Basophils Absolute 0.1 0.0 - 0.1 K/uL   Immature Granulocytes 2 %   Abs Immature Granulocytes 0.17 (H) 0.00 - 0.07 K/uL    Comment: Performed at [facility] [address] [place], [place] [address]   No results found.    Blood pressure 134/80, pulse 86, temperature 98.6 F (37 C), temperature source Oral, resp. rate 18, height 6' (1.829 m), weight 85.7 kg, SpO2 98 %.  Medical Problem List and Plan: 1. Functional deficits secondary to left hip fracture  -patient may shower  -ELOS/Goals: 5-6 days modI  Admit to CIR 2.  Antithrombotics: -DVT/anticoagulation:  Pharmaceutical: Lovenox  -antiplatelet therapy: Plavix 3. Pain Management: Oxycodone prn.  4. Mood/Behavior/Sleep: LCSW to follow for evaluation and support.   --trazodone prn for sleep  -antipsychotic agents: N/A 5. Neuropsych/cognition: This patient maybe capable of making decisions on his own behalf. 6. Left hip incision: staples can be removed 12/11- 12/15, discussed with patient.  7. Fluids/Electrolytes/Nutrition: Monitor I/O. Check CMET in am 8. HTN: Monitor BP TID--continue Altace and metoprolol  9. H/o CVA/CAD:  Resume Plavix 10. T2DM: Monitor BS ac/hs. Continue Actos and Jardiance/Metformin  --SSI for elevated BS.  11. Hypokalemia: Recheck in am 12. ABLA: recheck in am. 13. URI: On mucinex for bad cold (got it from daughter visiting for thanksgiving) 14. Vascular parkinsonism: start Sinemet 25-'100mg'$  BID 15. Screening for vitamin D deficiency: check vitamin D level today   I have  personally performed a face to face diagnostic evaluation, including, but not limited to relevant history and physical exam findings, of this patient and developed relevant assessment and plan.  Additionally, I have reviewed and concur with the physician assistant's documentation above.  Reesa Chew, [place]  Izora Ribas, MD 03/09/2022

## 2022-03-09 NOTE — Progress Notes (Signed)
Inpatient Rehabilitation  Patient information reviewed and entered into eRehab system by Tea Collums M. Kina Shiffman, M.A., CCC/SLP, PPS Coordinator.  Information including medical coding, functional ability and quality indicators will be reviewed and updated through discharge.    

## 2022-03-09 NOTE — Progress Notes (Signed)
[facility] Individual Statement of Services  Patient Name:  Kesean Serviss.  Date:  03/09/2022  Welcome to the [facility].  Our goal is to provide you with an individualized program based on your diagnosis and situation, designed to meet your specific needs.  With this comprehensive rehabilitation program, you will be expected to participate in at least 3 hours of rehabilitation therapies Monday-Friday, with modified therapy programming on the weekends.  Your rehabilitation program will include the following services:  Physical Therapy (PT), Occupational Therapy (OT), Speech Therapy (ST), 24 hour per day rehabilitation nursing, Therapeutic Recreaction (TR), Neuropsychology, Care Coordinator, Rehabilitation Medicine, Nutrition Services, Pharmacy Services, and Other  Weekly team conferences will be held on Wednesdays to discuss your progress.  Your Inpatient Rehabilitation Care Coordinator will talk with you frequently to get your input and to update you on team discussions.  Team conferences with you and your family in attendance may also be held.  Expected length of stay:  8-10 Days  Overall anticipated outcome:  MOD I  Depending on your progress and recovery, your program may change. Your Inpatient Rehabilitation Care Coordinator will coordinate services and will keep you informed of any changes. Your Inpatient Rehabilitation Care Coordinator's name and contact numbers are listed  below.  The following services may also be recommended but are not provided by the [facility]:   [place] will be made to provide these services after discharge if needed.  Arrangements include referral to agencies that provide these services.  Your insurance has been verified to be:  Medicare A & B Your primary doctor is:  Tisovec, Richard, MD  Pertinent information will  be shared with your doctor and your insurance company.  Inpatient Rehabilitation Care Coordinator:  Erlene Quan, [place] or [phone]  Information discussed with and copy given to patient by: Dyanne Iha, 03/09/2022, 11:36 AM

## 2022-03-09 NOTE — Evaluation (Signed)
Occupational Therapy Assessment and Plan  Patient Details  Name: Joel Gonzales. MRN: [id number] Date of Birth: Jun 21, 1945  OT Diagnosis: abnormal posture, acute pain, muscle weakness (generalized), and pain in joint Rehab Potential: Rehab Potential (ACUTE ONLY): Good ELOS: 7-10 days   Today's Date: 03/09/2022 OT Individual Time: 3428-7681 OT Individual Time Calculation (min): 70 min     Hospital Problem: Principal Problem:   Hip fracture requiring operative repair [facility])   Past Medical History:  Past Medical History:  Diagnosis Date   CAD (coronary artery disease)    Diabetes mellitus without complication ([place])    Type 2   Family history of ASCVD    History of stress test 01/2012   which was entirely normal   Hx of CABG    x6 LIma to his LAD, a RIMA to his PDA, right radial to obtuse marginal branch, diagnonal, second marginal, & posteolateral branches   Hyperlipemia    Hypertension    RBBB    chronic   Past Surgical History:  Past Surgical History:  Procedure Laterality Date   CHOLECYSTECTOMY  2002   CORONARY ARTERY BYPASS GRAFT     x6 LIma to his LAD, a RIMA to his PDA, right radial to obtuse marginal branch, diagnonal, second marginal, & posteolateral branches   NOSE SURGERY  02/2013   carcinoma   TONSILLECTOMY     as a child   TOTAL HIP ARTHROPLASTY Right 02/18/2021   Procedure: TOTAL HIP ARTHROPLASTY ANTERIOR APPROACH;  Surgeon: Mcarthur Rossetti, MD;  Location: [facility];  Service: Orthopedics;  Laterality: Right;   TOTAL HIP ARTHROPLASTY Left 03/03/2022   Procedure: TOTAL HIP ARTHROPLASTY ANTERIOR APPROACH;  Surgeon: Mcarthur Rossetti, MD;  Location: [facility];  Service: Orthopedics;  Laterality: Left;    Assessment & Plan Clinical Impression: Patient is a 76 y.o. 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 Patient was brought into [facility]   ED  03/02/22 after an episode of fall. Patient was diagnosed with vascular parkinsonism 4 years ago at [facility].  He has trouble with his gait.  He sometimes has trouble stopping and tends to fall forward.  The day of admission,  he was walking and  fell forward hitting his face against a parked car, then fell to the ground impacting his left hip.  He sustained a scrap to his nose and forehead and started hurting in the left hip and is brought to the ED. In the ED Pt. Was  hemodynamically stable, breathing on room air.Labs with WC count 11.3, hemoglobin 15.1, platelet 219, BUN/creatinine 29/1.19, glucose level elevated to 189.CT scan of left hip showed an acute intra-articular left femoral neck fracture with transcervical and subcapital components and mild apex anterior angulation. CT scan of head, cervical spine and maxillofacial were also obtained. No acute intracranial abnormality. Negative for cervical spine fracture. Atrophy and chronic microvascular ischemic change in the white matter. Fracture of the nasal bone with mild displacement. No other facial fracture.  ED physician discussed the case with Orthopedics Dr. Ninfa Linden. Pt. Underwent L THA anterior approach (03/03/22)  with Dr. Ninfa Linden. Pt. Seen by PT/OT and they recommend CIR to assist return to PLOF. Patient transferred to CIR on 03/08/2022 .    Patient currently requires min A with basic self-care skills secondary to muscle weakness, decreased cardiorespiratoy endurance, decreased coordination, and decreased standing balance and decreased postural  control.  Prior to hospitalization, patient could complete all self-care independently.  Patient will benefit from skilled intervention to increase independence with basic self-care skills and increase level of independence with iADL prior to discharge home with care partner.  Anticipate patient will require intermittent supervision and follow up outpatient.  OT - End of Session Activity Tolerance: Tolerates  < 10 min activity, no significant change in vital signs Endurance Deficit: Yes Endurance Deficit Description: frequent rest breaks needed throughout OT Assessment Rehab Potential (ACUTE ONLY): Good OT Barriers to Discharge: Home environment access/layout OT Patient demonstrates impairments in the following area(s): Balance;Endurance;Motor;Pain OT Basic ADL's Functional Problem(s): Bathing;Dressing;Toileting OT Advanced ADL's Functional Problem(s): Simple Meal Preparation OT Transfers Functional Problem(s): Toilet;Tub/Shower OT Additional Impairment(s): None OT Plan OT Intensity: Minimum of 1-2 x/day, 45 to 90 minutes OT Frequency: 5 out of 7 days OT Duration/Estimated Length of Stay: 7-10 days OT Treatment/Interventions: Training and development officer;Wheelchair propulsion/positioning;Therapeutic Exercise;Self Care/advanced ADL retraining;Neuromuscular re-education;Disease mangement/prevention;Pain management;UE/LE Strength taining/ROM;Patient/family education;UE/LE Coordination activities;Discharge planning;Functional mobility training;Psychosocial support;Therapeutic Activities OT Self Feeding Anticipated Outcome(s): Indep OT Basic Self-Care Anticipated Outcome(s): Supervision to mod I OT Toileting Anticipated Outcome(s): Mod I OT Bathroom Transfers Anticipated Outcome(s): Mod I OT Recommendation Patient destination: Home Follow Up Recommendations: Outpatient OT Equipment Recommended: Tub/shower bench   OT Evaluation Precautions/Restrictions  Precautions Precautions: Fall Restrictions Weight Bearing Restrictions: No LLE Weight Bearing: Weight bearing as tolerated Home Living/Prior Functioning Home Living Family/patient expects to be discharged to:: Private residence Living Arrangements: Spouse/significant other Available Help at Discharge: Family, Available 24 hours/day Type of Home: House Home Access: Stairs to enter Technical brewer of Steps: 3 Entrance Stairs-Rails:  Left Home Layout: Two level, Able to live on main level with bedroom/bathroom Alternate Level Stairs-Number of Steps: flight Alternate Level Stairs-Rails: Left Bathroom Shower/Tub: Tub/shower unit (has tub/shower in both bathrooms on 1st and 2nd floor) Bathroom Toilet: Standard Bathroom Accessibility: Yes Additional Comments: pt reports starting using SPC 1 week prior to fall. Pt reports having an "interesting" gait pattern. 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, materials nursery Leisure and Hobbies: tending to the nursery/farm IADL Comments: reports he makes all the meals but wife does the rest Prior Function Level of Independence: Independent with basic ADLs, Independent with transfers, Independent with homemaking with ambulation, Independent with gait  Able to Take Stairs?: Yes Driving: Yes Vision Baseline Vision/History: 1 Wears glasses (all the time) Ability to See in Adequate Light: 0 Adequate Patient Visual Report: No change from baseline Vision Assessment?: No apparent visual deficits Perception  Perception: Within Functional Limits Praxis Praxis: Intact Cognition Cognition Overall Cognitive Status: Within Functional Limits for tasks assessed Arousal/Alertness: Awake/alert Orientation Level: Person;Place;Situation Person: Oriented Place: Oriented Situation: Oriented Memory: Appears intact Awareness: Appears intact Problem Solving: Appears intact Safety/Judgment: Appears intact Brief Interview for Mental Status (BIMS) Repetition of Three Words (First Attempt): 3 Temporal Orientation: Year:  Correct Temporal Orientation: Month: Accurate within 5 days Temporal Orientation: Day: Correct Recall: "Sock": Yes, no cue required Recall: "Blue": Yes, no cue required Recall: "Bed": Yes, no cue required BIMS Summary Score: 15 Sensation Sensation Light Touch: Appears Intact Hot/Cold: Not tested Proprioception: Appears Intact Stereognosis: Not tested Coordination  Gross Motor Movements are Fluid and Coordinated: No Fine Motor Movements are Fluid and Coordinated: Yes Heel Shin Test: WFL on RLE, unable to lift LLE Motor  Motor Motor: Other (comment) Motor - Skilled Clinical Observations: generalized weakness/deconditioning, decreased balance strategies, 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  Trunk/Postural Assessment  Cervical Assessment Cervical Assessment: Exceptions to [facility] (forward head) Thoracic Assessment Thoracic Assessment: Exceptions to [facility] (rounded shoulders) Lumbar Assessment Lumbar Assessment: Exceptions to [facility] (posterior pelvic tilt) Postural Control Postural Control: Deficits on evaluation Trunk Control: posterior lean in sitting  Balance Balance Balance Assessed: Yes Static Sitting Balance Static Sitting - Balance Support: Feet supported;Bilateral upper extremity supported Static Sitting - Level of Assistance: 5: Stand by assistance (CGA) Dynamic Sitting Balance Dynamic Sitting - Balance Support: Feet supported;No upper extremity supported Dynamic Sitting - Level of Assistance: 5: Stand by assistance (CGA) Static Standing Balance Static Standing - Balance Support: No upper extremity supported Static Standing - Level of Assistance: 5: Stand by assistance (CGA) Dynamic Standing Balance Dynamic Standing - Balance Support: No upper extremity supported;During functional activity Dynamic Standing - Level of Assistance: 4: Min assist Dynamic Standing - Comments: with transfers and gait Extremity/Trunk Assessment RUE Assessment RUE Assessment: Within  Functional Limits LUE Assessment LUE Assessment: Within Functional Limits  Care Tool Care Tool Self Care Eating   Eating Assist Level: Set up assist    Oral Care    Oral Care Assist Level: Set up assist    Bathing   Body parts bathed by patient: Right arm;Left arm;Chest;Abdomen;Front perineal area;Right upper leg;Left upper leg;Right lower leg;Face     Assist Level: Minimal Assistance - Patient > 75%    Upper Body Dressing(including orthotics)   What is the patient wearing?: Pull over shirt   Assist Level: Set up assist    Lower Body Dressing (excluding footwear)   What is the patient wearing?: Underwear/pull up;Pants Assist for lower body dressing: Minimal Assistance - Patient > 75%    Putting on/Taking off footwear   What is the patient wearing?: Non-skid slipper socks Assist for footwear: Dependent - Patient 0%       Care Tool Toileting Toileting activity   Assist for toileting: Minimal Assistance - Patient > 75%     Care Tool Bed Mobility Roll left and right activity        Sit to lying activity        Lying to sitting on side of bed activity         Care Tool Transfers Sit to stand transfer   Sit to stand assist level: Minimal Assistance - Patient > 75%    Chair/bed transfer         Toilet transfer   Assist Level: Minimal Assistance - Patient > 75%     Care Tool Cognition  Expression of Ideas and Wants Expression of Ideas and Wants: 4. Without difficulty (complex and basic) - expresses complex messages without difficulty and with speech that is clear and easy to understand  Understanding Verbal and Non-Verbal Content Understanding Verbal and Non-Verbal Content: 4. Understands (complex and basic) - clear comprehension without cues or repetitions   Memory/Recall Ability Memory/Recall Ability : Current season;That he or she is in a hospital/hospital unit   Refer to Care Plan for [place] 1 OT Short Term Goal 1 (Week  1): STG = LTG due to ELOS  Recommendations for other services: None    Skilled Therapeutic Intervention Patient received seated in  w/c upon therapy arrival and agreeable to participate in OT evaluation. No pain reported however did express fatigue. Education provided on OT purpose, therapy schedule, goals for therapy, and safety policy while in rehab. Patient demonstrates balance, LLE AROM/strength deficits, endurance and coordination challenges resulting in difficulty completing BADL tasks without increased physical assist. Pt will benefit from skilled OT services to focus on mentioned deficits. See below for ADL and functional transfer performance.   During tub/shower transfer, pt reported feeling "woozy." BP assessed sitting in w/c with 115/90 reading, and 109/58 in stance. Pt reported he hadn't eaten much. Provided diet ginger ale (per nursing, no orange juice to prevent false spike in CBG as pt is diabetic), but pt declined a snack. Assisted pt into bed for rest. Pt remained upright in bed at conclusion of session with bed alarm on and all needs met at end of session.   ADL ADL Eating: Set up Where Assessed-Eating: Wheelchair Grooming: Setup Where Assessed-Grooming: Sitting at sink Upper Body Bathing: Setup Where Assessed-Upper Body Bathing: Sitting at sink Lower Body Bathing: Minimal assistance Where Assessed-Lower Body Bathing: Sitting at sink;Standing at sink Upper Body Dressing: Setup Where Assessed-Upper Body Dressing: Wheelchair Lower Body Dressing: Minimal assistance Where Assessed-Lower Body Dressing: Sitting at sink;Standing at sink Toileting: Minimal assistance Where Assessed-Toileting: Glass blower/designer: Psychiatric nurse Method: Counselling psychologist: Energy manager: Metallurgist Method: Educational psychologist: Civil Service fast streamer: Pension scheme manager Method: Ambulating;Stand pivot Youth worker: Grab bars;Shower seat with back Mobility  Transfers Sit to Stand: Minimal Assistance - Patient > 75% Stand to Sit: Contact Guard/Touching assist   Discharge Criteria: Patient will be discharged from OT if patient refuses treatment 3 consecutive times without medical reason, if treatment goals not met, if there is a change in medical status, if patient makes no progress towards goals or if patient is discharged from hospital.  The above assessment, treatment plan, treatment alternatives and goals were discussed and mutually agreed upon: by patient  Blase Mess, MS, OTR/L  03/09/2022, 7:25 PM

## 2022-03-09 NOTE — Evaluation (Signed)
Physical Therapy Assessment and Plan  Patient Details  Name: Joel Gonzales. MRN: [id number] Date of Birth: 20-Mar-1946  PT Diagnosis: Abnormal posture, Abnormality of gait, Difficulty walking, and Pain in L hip Rehab Potential: Good ELOS: 7-10 days   Today's Date: 03/09/2022 PT Individual Time: 0731-0842 PT Individual Time Calculation (min): 71 min    Hospital Problem: Principal Problem:   Hip fracture requiring operative repair [facility])   Past Medical History:  Past Medical History:  Diagnosis Date   CAD (coronary artery disease)    Diabetes mellitus without complication ([place])    Type 2   Family history of ASCVD    History of stress test 01/2012   which was entirely normal   Hx of CABG    x6 LIma to his LAD, a RIMA to his PDA, right radial to obtuse marginal branch, diagnonal, second marginal, & posteolateral branches   Hyperlipemia    Hypertension    RBBB    chronic   Past Surgical History:  Past Surgical History:  Procedure Laterality Date   CHOLECYSTECTOMY  2002   CORONARY ARTERY BYPASS GRAFT     x6 LIma to his LAD, a RIMA to his PDA, right radial to obtuse marginal branch, diagnonal, second marginal, & posteolateral branches   NOSE SURGERY  02/2013   carcinoma   TONSILLECTOMY     as a child   TOTAL HIP ARTHROPLASTY Right 02/18/2021   Procedure: TOTAL HIP ARTHROPLASTY ANTERIOR APPROACH;  Surgeon: Mcarthur Rossetti, MD;  Location: [place];  Service: Orthopedics;  Laterality: Right;   TOTAL HIP ARTHROPLASTY Left 03/03/2022   Procedure: TOTAL HIP ARTHROPLASTY ANTERIOR APPROACH;  Surgeon: Mcarthur Rossetti, MD;  [facility];  Service: Orthopedics;  Laterality: Left;    Assessment & Plan Clinical Impression: Patient is a 76 y.o. 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 Patient was brought into [place]  ED  03/02/22 after an episode of fall.  Patient was diagnosed with vascular parkinsonism 4 years ago at [facility].  He has trouble with his gait.  He sometimes has trouble stopping and tends to fall forward.  The day of admission,  he was walking and  fell forward hitting his face against a parked car, then fell to the ground impacting his left hip.  He sustained a scrap to his nose and forehead and started hurting in the left hip and is brought to the ED. In the ED Pt. Was  hemodynamically stable, breathing on room air.Labs with WC count 11.3, hemoglobin 15.1, platelet 219, BUN/creatinine 29/1.19, glucose level elevated to 189.CT scan of left hip showed an acute intra-articular left femoral neck fracture with transcervical and subcapital components and mild apex anterior angulation. CT scan of head, cervical spine and maxillofacial were also obtained. No acute intracranial abnormality. Negative for cervical spine fracture. Atrophy and chronic microvascular ischemic change in the white matter. Fracture of the nasal bone with mild displacement. No other facial fracture.  ED physician discussed the case with Orthopedics Dr. Ninfa Linden. Pt. Underwent L THA anterior approach (03/03/22)  with Dr. Ninfa Linden. Pt. Seen by PT/OT and they recommend CIR to assist return to PLOF.   Patient currently requires min with mobility secondary to muscle weakness, decreased cardiorespiratoy endurance, and decreased standing balance, decreased postural control, and decreased balance strategies.  Prior to hospitalization, patient was independent  with mobility and lived with  Spouse in a House home.  Home access is 3Stairs to enter.  Patient will benefit from skilled PT intervention to maximize safe functional mobility, minimize fall risk, and decrease caregiver burden for planned discharge home with 24 hour supervision.  Anticipate patient will benefit from follow up OP at discharge.  PT - End of Session Activity Tolerance: Tolerates 30+ min activity with multiple  rests Endurance Deficit: Yes Endurance Deficit Description: required rest breaks throughout PT Assessment Rehab Potential (ACUTE/IP ONLY): Good PT Barriers to Discharge: [place] home environment;Home environment access/layout;Other (comments);Wound Care PT Barriers to Discharge Comments: 3 STE with L handrail and flight of steps to get upstairs. Hx of some Parkinsonism related symptoms PT Patient demonstrates impairments in the following area(s): Balance;Endurance;Motor;Pain;Skin Integrity PT Transfers Functional Problem(s): Bed Mobility;Bed to Chair;Car;Furniture PT Locomotion Functional Problem(s): Ambulation;Wheelchair Mobility;Stairs PT Plan PT Intensity: Minimum of 1-2 x/day ,45 to 90 minutes PT Frequency: 5 out of 7 days PT Duration Estimated Length of Stay: 7-10 days PT Treatment/Interventions: Discharge planning;Ambulation/gait training;Functional mobility training;Therapeutic Activities;Visual/perceptual remediation/compensation;Balance/vestibular training;Disease management/prevention;Neuromuscular re-education;Skin care/wound management;Therapeutic Exercise;Wheelchair propulsion/positioning;DME/adaptive equipment instruction;Pain management;UE/LE Strength taining/ROM;Community reintegration;Patient/family education;Stair training;UE/LE Coordination activities PT Transfers Anticipated Outcome(s): Mod I with LRAD PT Locomotion Anticipated Outcome(s): Mod I with LRAD PT Recommendation Follow Up Recommendations: Outpatient PT Patient destination: Home Equipment Recommended: To be determined Equipment Details: pt has SPC at home   PT Evaluation Precautions/Restrictions Precautions Precautions: Fall Restrictions Weight Bearing Restrictions: No LLE Weight Bearing: Weight bearing as tolerated Pain Interference Pain Interference Pain Effect on Sleep: 1. Rarely or not at all Pain Interference with Therapy Activities: 1. Rarely or not at all Pain Interference with Day-to-Day  Activities: 1. Rarely or not at all Home Living/Prior [place]: Spouse/significant other Available Help at Discharge: Family;Available 24 hours/day Type of Home: House Home Access: Stairs to enter [employer] of Steps: 3 Entrance Stairs-Rails: Left Home Layout: Two level;Able to live on main level with bedroom/bathroom Alternate Level Stairs-Number of Steps: flight Alternate Level Stairs-Rails: Left Bathroom Shower/Tub: Tub/shower unit (has tub/shower in both bathrooms on 1st and 2nd floor) Bathroom Toilet: Standard Bathroom Accessibility: Yes Additional Comments: pt reports starting using SPC 1 week prior to fall. Pt reports having an "interesting" gait pattern. He starts walking slow and then his feet start mvoing so fast that he can't slow himself down  Lives With: Spouse Prior Function Level of Independence: Independent with basic ADLs;Independent with transfers;Independent with homemaking with ambulation;Independent with gait  Able to Take Stairs?: Yes Driving: Yes Vision/Perception  Vision - History Ability to See in Adequate Light: 0 Adequate Perception Perception: Within Functional Limits Praxis Praxis: Intact  Cognition Overall Cognitive Status: Within Functional Limits for tasks assessed Arousal/Alertness: Awake/alert Orientation Level: Oriented X4 Memory: Appears intact Awareness: Appears intact Problem Solving: Appears intact Safety/Judgment: Appears intact Sensation Sensation Light Touch: Appears Intact Hot/Cold: Not tested Proprioception: Appears Intact Stereognosis: Not tested Coordination Gross Motor Movements are Fluid and Coordinated: No Fine Motor Movements are Fluid and Coordinated: Yes Motor  Motor Motor: Other (comment) Motor - Skilled Clinical Observations: generalized weakness/deconditioning, decreased balance strategies, 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  Trunk/Postural Assessment   Cervical Assessment Cervical Assessment: Exceptions to [facility] (forward head) Thoracic Assessment Thoracic Assessment: Exceptions to [facility] (rounded shoulders) Lumbar Assessment Lumbar Assessment: Exceptions to [facility] (posterior pelvic tilt) Postural Control Postural Control: Deficits on evaluation Trunk Control: posterior lean in sitting  Balance Balance Balance Assessed: Yes Static Sitting Balance Static Sitting - Balance Support: Feet supported;Bilateral upper extremity  supported Static Sitting - Level of Assistance: 5: Stand by assistance (CGA) Dynamic Sitting Balance Dynamic Sitting - Balance Support: Feet supported;No upper extremity supported Dynamic Sitting - Level of Assistance: 5: Stand by assistance (CGA) Static Standing Balance Static Standing - Balance Support: No upper extremity supported Static Standing - Level of Assistance: 5: Stand by assistance (CGA) Dynamic Standing Balance Dynamic Standing - Balance Support: No upper extremity supported;During functional activity Dynamic Standing - Level of Assistance: 4: Min assist Dynamic Standing - Comments: with transfers and gait Extremity Assessment  RLE Assessment RLE Assessment: Exceptions to [facility] Strength Comments: tested sitting EOB RLE Strength Right Hip Flexion: 4-/5 Right Hip ABduction: 4/5 Right Hip ADduction: 4/5 Right Knee Flexion: 4/5 Right Knee Extension: 4-/5 Right Ankle Dorsiflexion: 4/5 Right Ankle Plantar Flexion: 4/5 LLE Assessment LLE Assessment: Exceptions to [facility] Strength Comments: tested sitting EOB LLE Strength Left Hip Flexion: 2+/5 Left Hip ABduction: 3+/5 Left Hip ADduction: 3+/5 Left Knee Flexion: 3+/5 Left Knee Extension: 3+/5 Left Ankle Dorsiflexion: 4/5 Left Ankle Plantar Flexion: 4/5  Care Tool Care Tool Bed Mobility Roll left and right activity   Roll left and right assist level: Minimal Assistance - Patient > 75%    Sit to lying activity        Lying to sitting  on side of bed activity   Lying to sitting on side of bed assist level: the ability to move from lying on the back to sitting on the side of the bed with no back support.: Minimal Assistance - Patient > 75%     Care Tool Transfers Sit to stand transfer   Sit to stand assist level: Minimal Assistance - Patient > 75%    Chair/bed transfer   Chair/bed transfer assist level: Minimal Assistance - Patient > 75%     Physiological scientist transfer assist level: Minimal Assistance - Patient > 75%      Care Tool Locomotion Ambulation   Assist level: Minimal Assistance - Patient > 75% Assistive device: No Device Max distance: 130f  Walk 10 feet activity   Assist level: Minimal Assistance - Patient > 75% Assistive device: No Device   Walk 50 feet with 2 turns activity   Assist level: Minimal Assistance - Patient > 75% Assistive device: No Device  Walk 150 feet activity Walk 150 feet activity did not occur: Safety/medical concerns (fatigue, weakness)      Walk 10 feet on uneven surfaces activity Walk 10 feet on uneven surfaces activity did not occur: Safety/medical concerns (fatigue, weakness)      Stairs   Assist level: Contact Guard/Touching assist Stairs assistive device: 2 hand rails Max number of stairs: 4 (6in)  Walk up/down 1 step activity   Walk up/down 1 step (curb) assist level: Contact Guard/Touching assist Walk up/down 1 step or curb assistive device: 2 hand rails  Walk up/down 4 steps activity   Walk up/down 4 steps assist level: Contact Guard/Touching assist Walk up/down 4 steps assistive device: 2 hand rails  Walk up/down 12 steps activity Walk up/down 12 steps activity did not occur: Safety/medical concerns (fatigue, weakness)      Pick up small objects from floor Pick up small object from the floor (from standing position) activity did not occur: Safety/medical concerns (fatigue, weakness)      Wheelchair Is the patient using a wheelchair?:  Yes Type of Wheelchair: Manual   Wheelchair assist level: Supervision/Verbal cueing Max wheelchair  distance: 147f  Wheel 50 feet with 2 turns activity   Assist Level: Supervision/Verbal cueing  Wheel 150 feet activity   Assist Level: Minimal Assistance - Patient > 75%    Refer to Care Plan for Long Term Goals  SHORT TERM GOAL WEEK 1 PT Short Term Goal 1 (Week 1): STG=LTG due to LOS  Recommendations for other services: None   Skilled Therapeutic Intervention Evaluation completed (see details above and below) with education on PT POC and goals and individual treatment initiated with focus on functional mobility/transfers, generalized strengthening and endurance, dynamic standing balance/coordination, simulated car transfers, stair navigation, and ambulation. Received pt semi-reclined in bed, pt educated on PT evaluation, CIR policies, and therapy schedule and agreeable. Pt denied any pain during session. Provided pt with scrub clothes and 18x16 manual WC. Pt transferred supine<>sitting EOB from flat bed using bedrails with min A for LLE management and cues for technique. Pt with posterior lean in sitting requiring BUE support, but aware and able to self-correct with cues. Donned pants sitting EOB with max A and pt stood with min A without AD to pull pants over hips. Doffed dirty shirt and donned clean pull over shirt with supervision.   Pt transferred bed<>WC stand<>pivot without AD and min A - noted unsafe technique and pt reaching forward to grab onto WE[facility]erd[facility] LLCarmrests and then pivoting around. Pt performed WC mobility 1071fusing BUE and supervision to ortho gym and performed simulated car transfer without AD and min A - cues to sit prior to getting legs in rather than side stepping. Pt then stood without AD and min A and ambulated 12550fithout AD and min A - noted decreased weight shifting to L, narrow BOS, and short step length. Took seated rest break then ambulated additional [id number] RW and  CGA fading to close supervision - noted increased stride length. Pt navigated 4 steps with 2 handrails and CGA ascending and descending with a step to pattern - pt with good recall with "up with the good, down with the bad" technique. Pt transported back to room and concluded session with pt sitting in WC, needs within reach, and chair pad alarm on. Safety plan updated and RN present at bedside attending to care.   Mobility Bed Mobility Bed Mobility: Rolling Left;Supine to Sit Rolling Left: Minimal Assistance - Patient > 75% Supine to Sit: Minimal Assistance - Patient > 75% Transfers Transfers: Sit to Stand;Stand to Sit;Stand Pivot Transfers Sit to Stand: Minimal Assistance - Patient > 75% Stand to Sit: Contact Guard/Touching assist Stand Pivot Transfers: Minimal Assistance - Patient > 75% Transfer (Assistive device): None Locomotion  Gait Ambulation: Yes Gait Assistance: Minimal Assistance - Patient > 75% Gait Distance (Feet): 125 Feet Assistive device: None Gait Gait: Yes Gait Pattern: Impaired Gait Pattern: Step-to pattern;Step-through pattern;Decreased step length - right;Decreased step length - left;Decreased stride length;Decreased stance time - left;Decreased weight shift to left;Trunk flexed;Poor foot clearance - left;Poor foot clearance - right;Narrow base of support Gait velocity: decreased Stairs / Additional Locomotion Stairs: Yes Stairs Assistance: Contact Guard/Touching assist Stair Management Technique: Two rails Number of Stairs: 4 Height of Stairs: 6 Wheelchair Mobility Wheelchair Mobility: Yes Wheelchair Assistance: SupeChartered loss adjusterth upper extremities Wheelchair Parts Management: Needs assistance Distance: 100ft53fischarge Criteria: Patient will be discharged from PT if patient refuses treatment 3 consecutive times without medical reason, if treatment goals not met, if there is a change in medical status, if patient makes no  progress towards goals  or if patient is discharged from hospital.  The above assessment, treatment plan, treatment alternatives and goals were discussed and mutually agreed upon: by patient  Alfonse Alpers PT, DPT  03/09/2022, 12:23 PM

## 2022-03-09 NOTE — Telephone Encounter (Signed)
I am not aware of any research studies. He was initially diagnosed by [facility], if he wants to go and ask them I am happy to refer him back thanks

## 2022-03-09 NOTE — Progress Notes (Signed)
Inpatient Rehabilitation Care Coordinator Assessment and Plan Patient Details  Name: Joel Gonzales. MRN: [id number] Date of Birth: 01-29-1946  Today's Date: 03/09/2022  Hospital Problems: Principal Problem:   Hip fracture requiring operative repair [facility])  Past Medical History:  Past Medical History:  Diagnosis Date   CAD (coronary artery disease)    Diabetes mellitus without complication ([place])    Type 2   Family history of ASCVD    History of stress test 01/2012   which was entirely normal   Hx of CABG    x6 LIma to his LAD, a RIMA to his PDA, right radial to obtuse marginal branch, diagnonal, second marginal, & posteolateral branches   Hyperlipemia    Hypertension    RBBB    chronic   Past Surgical History:  Past Surgical History:  Procedure Laterality Date   CHOLECYSTECTOMY  2002   CORONARY ARTERY BYPASS GRAFT     x6 LIma to his LAD, a RIMA to his PDA, right radial to obtuse marginal branch, diagnonal, second marginal, & posteolateral branches   NOSE SURGERY  02/2013   carcinoma   TONSILLECTOMY     as a child   TOTAL HIP ARTHROPLASTY Right 02/18/2021   Procedure: TOTAL HIP ARTHROPLASTY ANTERIOR APPROACH;  Surgeon: Mcarthur Rossetti, MD;  Location: [place];  Service: Orthopedics;  Laterality: Right;   TOTAL HIP ARTHROPLASTY Left 03/03/2022   Procedure: TOTAL HIP ARTHROPLASTY ANTERIOR APPROACH;  Surgeon: Mcarthur Rossetti, MD;  [facility];  Service: Orthopedics;  Laterality: Left;   Social History:  reports that he has never smoked. He has never used smokeless tobacco. He reports current alcohol use. He reports that he does not use drugs.  Family / [place] Patient Roles: Spouse Spouse/Significant Other: Bertie Anticipated Caregiver: Berite Ability/Limitations of Caregiver: UP TO MIN A Caregiver Availability: 24/7 Family Dynamics: support  Social History Preferred language: English Religion: [employer] - How often do you  need to have someone help you when you read instructions, pamphlets, or other written material from your doctor or pharmacy?: Never Writes: Yes   Abuse/Neglect Abuse/Neglect Assessment Can Be Completed: Yes Physical Abuse: Denies Verbal Abuse: Denies Sexual Abuse: Denies Exploitation of patient/patient's resources: Denies Self-Neglect: Denies  Patient response to: Social Isolation - How often do you feel lonely or isolated from those around you?: Never  Emotional Status Recent Psychosocial Issues: coping  Patient / Family Perceptions, Expectations & Goals Pt/Family understanding of illness & functional limitations: yes Premorbid pt/family roles/activities: Previously independent at home, falls at home with spouse Anticipated changes in roles/activities/participation: patient anticipates to return to MOD I level. Spouse able to assist up to Min A Pt/family expectations/goals: [place]: None Premorbid Home Care/DME Agencies: Other (Comment) (Cane, Rolling walker, BSC) Transportation available at discharge: spouse Is the patient able to respond to transportation needs?: Yes In the past 12 months, has lack of transportation kept you from medical appointments or from getting medications?: No In the past 12 months, has lack of transportation kept you from meetings, work, or from getting things needed for daily living?: No Resource referrals recommended: Neuropsychology  Discharge Planning Living Arrangements: Spouse/significant other Support Systems: Spouse/significant other Type of Residence: Private residence Insurance Resources: Chartered certified accountant Resources: [place] Referred: No Living Expenses: Own Money Management: Patient, Spouse Does the patient have any problems obtaining your medications?: No Home Management: Independent Patient/Family Preliminary Plans: Spouse able to help manage if  needed Care Coordinator Barriers to Discharge: Decreased caregiver support Care Coordinator Anticipated Follow Up Needs: HH/OP Expected length of stay: 8-10 Days  Clinical Impression SW met with patient, introduced self and explained role. Patient anticipates discharging home MOD I but spouse is able to assist up to a MIN A level. Patient shares spouse will be present today. No additional questions or concerns.  Dyanne Iha 03/09/2022, 2:20 PM

## 2022-03-10 DIAGNOSIS — S72142A Displaced intertrochanteric fracture of left femur, initial encounter for closed fracture: Secondary | ICD-10-CM | POA: Diagnosis not present

## 2022-03-10 LAB — BASIC METABOLIC PANEL
Anion gap: 11 (ref 5–15)
BUN: 18 mg/dL (ref 8–23)
CO2: 23 mmol/L (ref 22–32)
Calcium: 8.9 mg/dL (ref 8.9–10.3)
Chloride: 102 mmol/L (ref 98–111)
Creatinine, Ser: 1.21 mg/dL (ref 0.61–1.24)
GFR, Estimated: >60 mL/min (ref >60)
Glucose, Bld: 195 mg/dL — ABNORMAL HIGH (ref 70–99)
Potassium: 3.7 mmol/L (ref 3.5–5.1)
Sodium: 136 mmol/L (ref 135–145)

## 2022-03-10 LAB — GLUCOSE, CAPILLARY
Glucose-Capillary: 113 mg/dL — ABNORMAL HIGH (ref 70–99)
Glucose-Capillary: 156 mg/dL — ABNORMAL HIGH (ref 70–99)
Glucose-Capillary: 118 mg/dL — ABNORMAL HIGH (ref 70–99)
Glucose-Capillary: 159 mg/dL — ABNORMAL HIGH (ref 70–99)

## 2022-03-10 LAB — MAGNESIUM: Magnesium: 1.8 mg/dL (ref 1.7–2.4)

## 2022-03-10 MED ORDER — CARBIDOPA-LEVODOPA 25-100 MG PO TABS
1.0000 | ORAL_TABLET | Freq: Every day | ORAL | Status: DC
  Administered 2022-03-11 – 2022-03-15 (×5): 1 via ORAL
  Filled 2022-03-10 (×5): qty 1

## 2022-03-10 MED ORDER — VITAMIN D (ERGOCALCIFEROL) 1.25 MG (50000 UNIT) PO CAPS
50000.0000 [IU] | ORAL_CAPSULE | ORAL | Status: DC
  Administered 2022-03-10: 50000 [IU] via ORAL
  Filled 2022-03-10: qty 1

## 2022-03-10 NOTE — Progress Notes (Signed)
Occupational Therapy Session Note  Patient Details  Name: Joel Gonzales. MRN: [id number] Date of Birth: [birth year]  Today's Date: 03/10/2022 OT Individual Time: 1005-1100 OT Individual Time Calculation (min): 55 min    Short Term Goals: Week 1:  OT Short Term Goal 1 (Week 1): STG = LTG due to ELOS  Skilled Therapeutic Interventions/Progress Updates:  Skilled OT intervention completed with focus on functional endurance, transfers and ADL retraining within a shower context. Pt received upright in bed, agreeable to session. No pain reported.  Agreeable to shower. Completed transition to EOB with time, but only CGA for trunk control as pt has posterior lean in sitting. Min A sit > stand using RW, then CGA ambulatory transfer with RW to shower chair, able to doff pants in standing with CGA, then total A for time for the rest seated. Pt was able to use urinal with set up A for continent void. Waterproof cover applied to L hip prior to shower.  Able to bathe with overall supervision, with use of LH sponge with education provided on use for LLE. Supervision needed for stand using grab bar to wash posterior peri-area. CGA stand pivot to w/c with use of grab bar.   Able to complete UB dressing with set up A, LB dressing with overall min A with education provided on use of reacher and sock aid for LB dressing at the sit > stand level without AD with CGA.  Groomed hair at sink with mod I, CGA sit > stand and stand pivot to EOB. Supervision bed mobility to upright in bed. Pt remained upright in bed, with bed alarm on/activated, and with all needs in reach at end of session.   Therapy Documentation Precautions:  Precautions Precautions: Fall Restrictions Weight Bearing Restrictions: No LLE Weight Bearing: Weight bearing as tolerated    Therapy/Group: Individual Therapy  Blase Mess, MS, OTR/L  03/10/2022, 12:50 PM

## 2022-03-10 NOTE — Progress Notes (Signed)
Physical Therapy Session Note  Patient Details  Name: Joel Gonzales. MRN: [id number] Date of Birth: Aug 14, 1945  Today's Date: 03/10/2022 PT Individual Time: 0731-0842 PT Individual Time Calculation (min): 71 min   Short Term Goals: Week 1:  PT Short Term Goal 1 (Week 1): STG=LTG due to LOS  Skilled Therapeutic Interventions/Progress Updates:   Received pt semi-reclined in bed, pt agreeable to PT treatment, and denied any pain during session. Session with emphasis on dressing, functional mobility/transfers, generalized strengthening and endurance, dynamic standing balance/coordination, stair navigation, and gait training. Pt transferred supine<>sitting EOB from flat bed using bedrails and supervision with increased time/effort. Washed lower body sitting EOB with set up assist. Pt required max A to don underwear, pants, socks, and shoes with max A for time management purposes. Doffed dirty shirt and donned clean shirt and sweatshirt with mod I. Stood from EOB with RW and close supervision to pull pants over hips and transferred bed<>WC stand<>pivot without AD and close supervision. Pt sat in WC at sink and brushed teeth with mod I. Pt transported to/from room in [facility] dependently.   Pt navigated 12 6in steps with L handrail and min A fading to CGA. Pt initially started ascending forwards but due to weakness in R hip resulting in increased unsteadiness, suggested using lateral stepping technique with BUE support on L handrail. Pt then performed WC mobility 172f using BUE and supervision to dayroom. Pt performed standing BLE strengthening on Kinetron at 30 cm/sec for 45 seconds - limited by pain. Transitioned to seated BLE strengthening for additional 1 minute x 3 trials at 20 cm/sec with emphasis on glute/quad strength. Pt then ambulated 367fwith RW and close supervision. Pt ambulates at decreased cadence with flexed trunk and downward gaze but demonstrated no LOB. Encouraged trying ambulating without  AD, however pt requested "not to push it" and declined. Returned to room and pt requested to return to bed. [facility] and ambulated back to bed with RW and supervision. Pt transferred sit<>supine with min A for RLE management. Concluded session with pt semi-reclined in bed, needs within reach, and bed alarm on.   Therapy Documentation Precautions:  Precautions Precautions: Fall Restrictions Weight Bearing Restrictions: No LLE Weight Bearing: Weight bearing as tolerated  Therapy/Group: Individual Therapy AnAlfonse AlpersT, DPT  03/10/2022, 6:51 AM

## 2022-03-10 NOTE — Progress Notes (Signed)
Patient ID: Cobin Cadavid., male   DOB: 12-Apr-1945, 76 y.o.   MRN: [id number]  SW spoke with patient spouse, Robet Leu. Spouse informed of patient's discharge on 12/3. Spouse reports the patient has a RW and he uses the [facility] to take showers, SW will discuss with therapy. Sw will have physician call spouse to provide medical updates. No additional questions or concerns.

## 2022-03-10 NOTE — Progress Notes (Addendum)
Patient ID: Joel Nies., male   DOB: February 06, 1946, 76 y.o.   MRN: [id number]  OP referral sent to [facility] for FU. F: [phone]   Rolling walker and TTB ordered through [employer]

## 2022-03-10 NOTE — Progress Notes (Signed)
PROGRESS NOTE   Subjective/Complaints: Had trouble sleeping last night- was only able to sleep at 3am, discussed could be secondary to Sinemet, discussed decreasing dose to daily and he is agreeable  ROS: +insomnia Objective:   No results found. Recent Labs    03/09/22 0619  WBC 8.6  HGB 10.7*  HCT 30.9*  PLT 264   Recent Labs    03/09/22 0619 03/10/22 0736  NA 138 136  K 4.9 3.7  CL 106 102  CO2 24 23  GLUCOSE 140* 195*  BUN 19 18  CREATININE 1.34* 1.21  CALCIUM 9.1 8.9    Intake/Output Summary (Last 24 hours) at 03/10/2022 1005 Last data filed at 03/10/2022 0813 Gross per 24 hour  Intake 692 ml  Output 2250 ml  Net -1558 ml        Physical Exam: Vital Signs Blood pressure (!) 140/68, pulse 100, temperature 98 F (36.7 C), temperature source Oral, resp. rate 18, height 6' (1.829 m), weight 85.7 kg, SpO2 97 %. Gen: no distress, normal appearing Eyes:     Comments: Infra-orbital ecchymosis noted  Cardiac: RRR Pulm: breathing comfortably on room air Abdomen: NT, ND Musculoskeletal:     Comments: Min edema left thigh. Surgical dressing left hip.   Neurological:     Mental Status: He is alert.  Skin: surgical dressing to left hip Psych: pleasant, friednly   Assessment/Plan: 1. Functional deficits which require 3+ hours per day of interdisciplinary therapy in a comprehensive inpatient rehab setting. Physiatrist is providing close team supervision and 24 hour management of active medical problems listed below. Physiatrist and rehab team continue to assess barriers to discharge/monitor patient progress toward functional and medical goals  Care Tool:  Bathing    Body parts bathed by patient: Right arm, Left arm, Chest, Abdomen, Front perineal area, Right upper leg, Left upper leg, Right lower leg, Face   Body parts bathed by helper: Left lower leg, Buttocks     Bathing assist Assist Level: Minimal  Assistance - Patient > 75%     Upper Body Dressing/Undressing Upper body dressing   What is the patient wearing?: Pull over shirt    Upper body assist Assist Level: Set up assist    Lower Body Dressing/Undressing Lower body dressing      What is the patient wearing?: Underwear/pull up, Pants     Lower body assist Assist for lower body dressing: Minimal Assistance - Patient > 75%     Toileting Toileting    Toileting assist Assist for toileting: Minimal Assistance - Patient > 75%     Transfers Chair/bed transfer  Transfers assist     Chair/bed transfer assist level: Contact Guard/Touching assist     Locomotion Ambulation   Ambulation assist      Assist level: Supervision/Verbal cueing Assistive device: Walker-rolling Max distance: 342f   Walk 10 feet activity   Assist     Assist level: Supervision/Verbal cueing Assistive device: Walker-rolling   Walk 50 feet activity   Assist    Assist level: Supervision/Verbal cueing Assistive device: Walker-rolling    Walk 150 feet activity   Assist Walk 150 feet activity did not occur: Safety/medical concerns (fatigue, weakness)  Assist level: Supervision/Verbal cueing Assistive device: Walker-rolling    Walk 10 feet on uneven surface  activity   Assist Walk 10 feet on uneven surfaces activity did not occur: Safety/medical concerns (fatigue, weakness)         Wheelchair     Assist Is the patient using a wheelchair?: Yes Type of Wheelchair: Manual    Wheelchair assist level: Supervision/Verbal cueing Max wheelchair distance: 172f    Wheelchair 50 feet with 2 turns activity    Assist        Assist Level: Supervision/Verbal cueing   Wheelchair 150 feet activity     Assist      Assist Level: Supervision/Verbal cueing   Blood pressure (!) 140/68, pulse 100, temperature 98 F (36.7 C), temperature source Oral, resp. rate 18, height 6' (1.829 m), weight 85.7 kg, SpO2  97 %.    Medical Problem List and Plan: 1. Functional deficits secondary to left hip fracture             -patient may shower             -ELOS/Goals: 5-6 days modI             Admit to CIR 2.  Antithrombotics: -DVT/anticoagulation:  Pharmaceutical: Lovenox             -antiplatelet therapy: Plavix 3. Pain Management: Oxycodone prn.  4. Insomnia: decrease Sinemet to daily.  5. Neuropsych/cognition: This patient maybe capable of making decisions on his own behalf. 6. Left hip incision: staples can be removed 12/11- 12/15, discussed with patient.  7. Fluids/Electrolytes/Nutrition: Monitor I/O. Check CMET in am 8. HTN: Monitor BP TID--continue Altace and metoprolol  9. H/o CVA/CAD:  Resume Plavix 10. T2DM: Monitor BS ac/hs. Continue Actos and Jardiance/Metformin  --SSI for elevated BS.  11. Hypokalemia: Recheck in am 12. ABLA: recheck in am. 13. 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 Suboptimal vitamin D level: start ergocalciferol 50,000U once per week for 7 weeks  LOS: 2 days A FACE TO FACE EVALUATION WAS PERFORMED  Ubaldo Daywalt P Diyari Cherne 03/10/2022, 10:05 AM

## 2022-03-10 NOTE — Progress Notes (Signed)
Physical Therapy Session Note  Patient Details  Name: Joel Gonzales. MRN: [id number] Date of Birth: 1945-12-08  Today's Date: 03/10/2022 PT Individual Time: 1432-1530 PT Individual Time Calculation (min): 58 min   Short Term Goals: Week 1:  PT Short Term Goal 1 (Week 1): STG=LTG due to LOS  Skilled Therapeutic Interventions/Progress Updates: Pt presents supine in bed and agreeable to therapy.  Pt performs bridge to pull pants over hips.  Pt transfers supine to sit w/ min A and cueing .  Pt sat EOB to don pull over sweatshirt, w/ retropulsion.  PT donned shoes total A.  Pt transfers sit to stand w/ supervision and increased time.  Pt amb to sink to brush hair and wash hands.  Pt wheeled to main gym for energy conservation.  Pt amb multiple trials of 150' including turns to return to seat.  Pt requires verbal cues for posture and walker management.  RW adjusted for height.  Pt requires seated rest breaks 2/2 fatigue.  Pt amb 200' x 2 w/ supervision and RW.  Pt amb from hallway into room and transferred stand to sit on bed, verbal cues for eccentric control and use of UE s.  Pt required CGA for LLE onto bed.  Pt able to scoot to [facility] w/ cues for R knee flexion to push.  Bed alarm on and all needs in reach.     Therapy Documentation Precautions:  Precautions Precautions: Fall Restrictions Weight Bearing Restrictions: No LLE Weight Bearing: Weight bearing as tolerated General:   Vital Signs: Therapy Vitals Temp: 98.6 F (37 C) Temp Source: Oral Pulse Rate: 72 Resp: 16 BP: 107/66 Patient Position (if appropriate): Lying Oxygen Therapy SpO2: 99 % O2 Device: Room Air Pain:tolerable, unable to quantify.       Therapy/Group: Individual Therapy  Ladoris Gene 03/10/2022, 4:18 PM

## 2022-03-11 DIAGNOSIS — S72002G Fracture of unspecified part of neck of left femur, subsequent encounter for closed fracture with delayed healing: Secondary | ICD-10-CM | POA: Diagnosis not present

## 2022-03-11 LAB — GLUCOSE, CAPILLARY
Glucose-Capillary: 102 mg/dL — ABNORMAL HIGH (ref 70–99)
Glucose-Capillary: 157 mg/dL — ABNORMAL HIGH (ref 70–99)
Glucose-Capillary: 141 mg/dL — ABNORMAL HIGH (ref 70–99)
Glucose-Capillary: 161 mg/dL — ABNORMAL HIGH (ref 70–99)

## 2022-03-11 MED ORDER — HYDROCORTISONE 1 % EX CREA
TOPICAL_CREAM | Freq: Two times a day (BID) | CUTANEOUS | Status: DC
  Administered 2022-03-12: 1 via TOPICAL
  Filled 2022-03-11 (×2): qty 28

## 2022-03-11 NOTE — IPOC Note (Signed)
Overall Plan of Care [facility]) Patient Details Name: Joel Gonzales. MRN: [id number] DOB: [birth year]  Admitting Diagnosis: Hip fracture requiring operative repair [facility])  Hospital Problems: Principal Problem:   Hip fracture requiring operative repair [facility])     Functional Problem List: Nursing Pain, Bladder, Bowel, Safety, Edema, Sensory, Endurance, Skin Integrity, Medication Management, Motor, Nutrition  PT Balance, Endurance, Motor, Pain, Skin Integrity  OT Balance, Endurance, Motor, Pain  SLP    TR         Basic ADL's: OT Bathing, Dressing, Toileting     Advanced  ADL's: OT Simple Meal Preparation     Transfers: PT Bed Mobility, Bed to Chair, Car, Manufacturing systems engineer, Metallurgist: PT Ambulation, Emergency planning/management officer, Stairs     Additional Impairments: OT None  SLP        TR      Anticipated Outcomes Item Anticipated Outcome  Self Feeding Indep  Swallowing      Basic self-care  Supervision to mod I  Toileting  Mod I   Bathroom Transfers Mod I  Bowel/Bladder  continent x 2  Transfers  Mod I with LRAD  Locomotion  Mod I with LRAD  Communication     Cognition     Pain  less than 4  Safety/Judgment  remain fall free while in rehab   Therapy Plan: PT Intensity: Minimum of 1-2 x/day ,45 to 90 minutes PT Frequency: 5 out of 7 days PT Duration Estimated Length of Stay: 7-10 days OT Intensity: Minimum of 1-2 x/day, 45 to 90 minutes OT Frequency: 5 out of 7 days OT Duration/Estimated Length of Stay: 7-10 days     Team Interventions: Nursing Interventions Patient/Family Education, Disease Management/Prevention, Skin Care/Wound Management, Discharge Planning, Bladder Management, Pain Management, Bowel Management, Medication Management  PT interventions Discharge planning, Ambulation/gait training, Functional mobility training, Therapeutic Activities, Visual/perceptual remediation/compensation, Balance/vestibular training, Disease  management/prevention, Neuromuscular re-education, Skin care/wound management, Therapeutic Exercise, Wheelchair propulsion/positioning, DME/adaptive equipment instruction, Pain management, UE/LE Strength taining/ROM, Community reintegration, Barrister's clerk education, IT trainer, UE/LE Coordination activities  OT Interventions Training and development officer, Wheelchair propulsion/positioning, Therapeutic Exercise, Self Care/advanced ADL retraining, Neuromuscular re-education, Disease mangement/prevention, Pain management, UE/LE Strength taining/ROM, Patient/family education, UE/LE Coordination activities, Discharge planning, Functional mobility training, Psychosocial support, Therapeutic Activities  SLP Interventions    TR Interventions    SW/CM Interventions Discharge Planning, Psychosocial Support, Disease Management/Prevention   Barriers to Discharge MD  Medical stability  Nursing Decreased caregiver support, Home environment access/layout, Incontinence, Wound Care 2 level home with B/B main level, 3 ste with rail on right  PT Inaccessible home environment, Home environment access/layout, Other (comments), Wound Care 3 STE with L handrail and flight of steps to get upstairs. Hx of some Parkinsonism related symptoms  OT Home environment access/layout    SLP      SW Decreased caregiver support     Team Discharge Planning: Destination: PT-Home ,OT- Home , SLP-  Projected Follow-up: PT-Outpatient PT, OT-  Outpatient OT, SLP-  Projected Equipment Needs: PT-To be determined, OT- Tub/shower bench, SLP-  Equipment Details: PT-pt has SPC at home, OT-  Patient/family involved in discharge planning: PT- Patient,  OT-Patient, SLP-   MD ELOS: 7 days Medical Rehab Prognosis:  Excellent Assessment: The patient has been admitted for CIR therapies with the diagnosis of hip fracture. The team will be addressing functional mobility, strength, stamina, balance, safety, adaptive techniques and equipment,  self-care, bowel and bladder mgt, patient and caregiver education. Goals have been  set at [place]. Anticipated discharge destination is home.        See Team Conference Notes for weekly updates to the plan of care

## 2022-03-11 NOTE — Progress Notes (Signed)
PROGRESS NOTE   Subjective/Complaints:   Discussed balance , parkinsonism, has seen local neurologist and movement d/o neurologist at [facility]  ROS: +insomnia- but pt admits to watching football game until 1am  + congestion (cold for a couple weeks) no cough or SOB Objective:   No results found. Recent Labs    03/09/22 0619  WBC 8.6  HGB 10.7*  HCT 30.9*  PLT 264    Recent Labs    03/09/22 0619 03/10/22 0736  NA 138 136  K 4.9 3.7  CL 106 102  CO2 24 23  GLUCOSE 140* 195*  BUN 19 18  CREATININE 1.34* 1.21  CALCIUM 9.1 8.9     Intake/Output Summary (Last 24 hours) at 03/11/2022 0915 Last data filed at 03/11/2022 0745 Gross per 24 hour  Intake 877 ml  Output 2025 ml  Net -1148 ml         Physical Exam: Vital Signs Blood pressure 135/77, pulse 94, temperature 97.8 F (36.6 C), temperature source Oral, resp. rate 17, height 6' (1.829 m), weight 85.7 kg, SpO2 98 %.  General: No acute distress Mood and affect are appropriate Heart: Regular rate and rhythm no rubs murmurs or extra sounds Lungs: Clear to auscultation, breathing unlabored, no rales or wheezes Abdomen: Positive bowel sounds, soft nontender to palpation, nondistended Extremities: No clubbing, cyanosis, or edema   Musculoskeletal:     Comments: Min edema left thigh. Surgical dressing left hip.   Neurological:     Mental Status: He is alert.  Skin: surgical dressing to left hip Psych: pleasant, friednly   Assessment/Plan: 1. Functional deficits which require 3+ hours per day of interdisciplinary therapy in a comprehensive inpatient rehab setting. Physiatrist is providing close team supervision and 24 hour management of active medical problems listed below. Physiatrist and rehab team continue to assess barriers to discharge/monitor patient progress toward functional and medical goals  Care Tool:  Bathing    Body parts bathed by patient:  Right arm, Left arm, Chest, Abdomen, Front perineal area, Right upper leg, Left upper leg, Right lower leg, Face, Buttocks, Left lower leg   Body parts bathed by helper: Left lower leg, Buttocks     Bathing assist Assist Level: Supervision/Verbal cueing     Upper Body Dressing/Undressing Upper body dressing   What is the patient wearing?: Pull over shirt    Upper body assist Assist Level: Set up assist    Lower Body Dressing/Undressing Lower body dressing      What is the patient wearing?: Underwear/pull up, Pants     Lower body assist Assist for lower body dressing: Minimal Assistance - Patient > 75%     Toileting Toileting    Toileting assist Assist for toileting: Minimal Assistance - Patient > 75%     Transfers Chair/bed transfer  Transfers assist     Chair/bed transfer assist level: Contact Guard/Touching assist     Locomotion Ambulation   Ambulation assist      Assist level: Supervision/Verbal cueing Assistive device: Walker-rolling Max distance: 200   Walk 10 feet activity   Assist     Assist level: Supervision/Verbal cueing Assistive device: Walker-rolling   Walk 50 feet activity  Assist    Assist level: Supervision/Verbal cueing Assistive device: Walker-rolling    Walk 150 feet activity   Assist Walk 150 feet activity did not occur: Safety/medical concerns (fatigue, weakness)  Assist level: Supervision/Verbal cueing Assistive device: Walker-rolling    Walk 10 feet on uneven surface  activity   Assist Walk 10 feet on uneven surfaces activity did not occur: Safety/medical concerns (fatigue, weakness)         Wheelchair     Assist Is the patient using a wheelchair?: Yes Type of Wheelchair: Manual    Wheelchair assist level: Supervision/Verbal cueing Max wheelchair distance: 139f    Wheelchair 50 feet with 2 turns activity    Assist        Assist Level: Supervision/Verbal cueing   Wheelchair 150  feet activity     Assist      Assist Level: Supervision/Verbal cueing   Blood pressure 135/77, pulse 94, temperature 97.8 F (36.6 C), temperature source Oral, resp. rate 17, height 6' (1.829 m), weight 85.7 kg, SpO2 98 %.    Medical Problem List and Plan: 1. Functional deficits secondary to left hip fracture             -patient may shower             -ELOS/Goals: 5-6 days modI             Admit to CIR 2.  Antithrombotics: -DVT/anticoagulation:  Pharmaceutical: Lovenox             -antiplatelet therapy: Plavix 3. Pain Management: Oxycodone prn.  4. Insomnia: decrease Sinemet to daily.  5. Neuropsych/cognition: This patient maybe capable of making decisions on his own behalf. 6. Left hip incision: staples can be removed 12/11- 12/15, discussed with patient.  7. Fluids/Electrolytes/Nutrition: Monitor I/O. Check CMET in am 8. HTN: Monitor BP TID--continue Altace and metoprolol Vitals:   03/10/22 1944 03/11/22 0420  BP: 131/66 135/77  Pulse: 86 94  Resp:  17  Temp: 98.5 F (36.9 C) 97.8 F (36.6 C)  SpO2: 97% 98%     9. H/o CVA/CAD:  Resume Plavix 10. T2DM: Monitor BS ac/hs. Continue Actos and Jardiance/Metformin  CBG (last 3)  Recent Labs    03/10/22 1626 03/10/22 2106 03/11/22 0607  GLUCAP 118* 159* 102*    11. Hypokalemia: Recheck in am 12. ABLA: recheck in am. 13. 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 Suboptimal vitamin D level: start ergocalciferol 50,000U once per week for 7 weeks  LOS: 3 days A FACE TO FACE EVALUATION WAS PERFORMED  ACharlett Blake12/12/2021, 9:15 AM

## 2022-03-12 LAB — GLUCOSE, CAPILLARY
Glucose-Capillary: 126 mg/dL — ABNORMAL HIGH (ref 70–99)
Glucose-Capillary: 171 mg/dL — ABNORMAL HIGH (ref 70–99)
Glucose-Capillary: 139 mg/dL — ABNORMAL HIGH (ref 70–99)
Glucose-Capillary: 150 mg/dL — ABNORMAL HIGH (ref 70–99)

## 2022-03-12 NOTE — Progress Notes (Signed)
Occupational Therapy Session Note  Patient Details  Name: Joel Gonzales. MRN: [id number] Date of Birth: December 23, 1945  Today's Date: 03/12/2022 OT Individual Time: 1017-1100 session 1 OT Individual Time Calculation (min): 43 min  Session 2: 1303-1407   Short Term Goals: Week 1:  OT Short Term Goal 1 (Week 1): STG = LTG due to ELOS  Skilled Therapeutic Interventions/Progress Updates:  Session 1:  Pt greeted seated in recliner, pt agreeable to OT intervention. Session focus on BLE strengthening, functional mobility, dynamic standing balance and decreasing overall caregiver burden.      Pt completed stand pivot from recliner>w/c with rw and CGA. Total A transport to gym for time mgmt. Pt completed below therapeutic activities with a focus on strengthening, dynamic balance and building overall strength/endurance:  - 1 min of cones taps with BUE support with CGA no LOB - 1 min of stepping on/off 2 inch step with BUE support with CGA - 1 min of standing ball tosses with no UE support to challenge dynamic balance - pt able to stand from EOM x5 with pt holding onto ball with light mIN A with an emphasis on BLE strength/endurance -graded task up and had pt hold 3 lb dowel rod while pt stood from EOM with no UE assist, once in standing pt instructed to reach dowel OH for + challenge, pt completed task with light MINA for sit>stand, pt completed x5 reps - pt completed x10 chest presses in standing as well as x10 OH presses with 3 lb dowel rod to facilitate improved strength and endurance for ADLs.                 - pt completed functional mobility obstacle course with RW with an emphasis on navigating around barriers in the home with pt stepping over obstacles with Rw and weaving in between tight spaces with Rw, pt completed task with close supervision for 85 ft.   Pt completed functional ambulation back to room with Rw and close supervision.   Ended session with pt seated in recliner with all  needs within reach.   Session 2:           Pt greeted seated in recliner, pt agreeable to OT intervention. Session focus on IADLs,  functional mobility, dynamic standing balance, shower transfers, DME education and decreasing overall caregiver burden.                   Pt completed stand pivot transfer from recliner>w/c with Rw and supervision. Total A transport to apt for time mgmt. Pt completed functional ambulation transfers to recliner , couch and TTB with Rw and supervision. Pt able to elevate BLE over ledge of tub once seated on TTB. Pt agreeable to TTB with education. Education provided on importance of mobilizing at least once every hour at home as well as energy conservation strategies for home.   Pt completed simulated meal prep task in kitchen with kitchen items laid out on sink and pt instructed to put items back in appropriate cabinets, pt completed task with RW and supervision. Pt even able to place pan on shelf at knee level, pt reports there is only one thing below knee level that he would need and his wife could retrieve item.     Ended session with pt seated in recliner with all needs within reach.    Therapy Documentation Precautions:  Precautions Precautions: Fall Restrictions Weight Bearing Restrictions: No LLE Weight Bearing: Weight bearing as tolerated  Pain: Session  1: no pain  Session 2: unrated pain reported in L quad, rest breaks provided as needed.     Therapy/Group: Individual Therapy  Joel Gonzales 03/12/2022, 12:18 PM

## 2022-03-12 NOTE — Progress Notes (Signed)
Physical Therapy Session Note  Patient Details  Name: Joel Gonzales. MRN: [id number] Date of Birth: 1945/08/24  Today's Date: 03/12/2022 PT Individual Time: 0800-0900 and 1415-1445 PT Individual Time Calculation (min): 60 min and 30 min  Short Term Goals: Week 1:  PT Short Term Goal 1 (Week 1): STG=LTG due to LOS  Skilled Therapeutic Interventions/Progress Updates:     Session 1: Patient in w/c with NT in the room upon PT arrival. Patient alert and agreeable to PT session. Patient denied pain during session. Reports fatigue from waking early to eat breakfast, get dressed, and wash his face this am.   Spent increased time discussing fall hx, gait deviations, fall prevention and recovery, use of emergency services in the event of a fall, and benefits of PT for recovery from THA and for balance and fall prevention given hx of x2 falls ~1 year apart. Emphasized importance of exercise and mobility to progress and maintain progress even following completion of therapies after d/c. Reports he has looked into an exercise program through [facility]. Educated on benefits of group exercise for consistency and acountability long term. Patient very appreciative and receptive to education and discussion. Eager to prevent any further falls in the future. Has goal to buy a boat and be able to safely use his boat in the future.   Therapeutic Activity: Transfers: Patient performed sit to/from stand x3 with supervision using RW. Provided verbal cues for forward weight shift, "chest to the wall".  Gait Training:  Patient ambulated 402 feet using RW with supervision. Ambulated with decreased gait speed, decreased step length and height, flat foot or forefoot at initial contact, forward trunk lean, and downward head gaze. Provided verbal cues for erect posture, looking ahead, heel strike at initial contact, and increased step height.  Therapeutic Exercise: Patient performed the following exercises with verbal and  tactile cues for proper technique. -seated hamstring/gastroc stretch with strap 2x30 sec  Patient in recliner in the room at end of session with breaks locked and all needs within reach.   Session 2: Patient in recliner in the room upon PT arrival. Patient alert and agreeable to PT session. Patient denied pain during session, reports increased L hip and leg soreness this afternoon.  Therapeutic Activity: Bed Mobility: Patient performed sit to supine with supervision. Provided verbal cues for bringing knee to chest to improved L leg lift onto the bed. Transfers: Patient performed stand pivot reclienr>bed with supervision using RW. Provided verbal cues for safety awareness due to narrow space provided due to furniture set-up.   Therapeutic Exercise: Patient performed the following exercises with verbal and tactile cues for proper technique. -L heel slides AAROM 2x10 -L hip abd/add AROM 2x10 -seated B isometric hip adduction 2x10 with 5 sec hold -L seated hip flexion 2x10  Patient in bed at end of session with breaks locked, bed alarm set, and all needs within reach.   Therapy Documentation Precautions:  Precautions Precautions: Fall Restrictions Weight Bearing Restrictions: No LLE Weight Bearing: Weight bearing as tolerated    Therapy/Group: Individual Therapy  Romain Erion L Crystalyn Delia PT, DPT, NCS, CBIS  03/12/2022, 3:57 PM

## 2022-03-13 ENCOUNTER — Telehealth: Payer: Self-pay | Admitting: Neurology

## 2022-03-13 DIAGNOSIS — S72142A Displaced intertrochanteric fracture of left femur, initial encounter for closed fracture: Secondary | ICD-10-CM | POA: Diagnosis not present

## 2022-03-13 LAB — GLUCOSE, CAPILLARY
Glucose-Capillary: 109 mg/dL — ABNORMAL HIGH (ref 70–99)
Glucose-Capillary: 164 mg/dL — ABNORMAL HIGH (ref 70–99)
Glucose-Capillary: 162 mg/dL — ABNORMAL HIGH (ref 70–99)
Glucose-Capillary: 167 mg/dL — ABNORMAL HIGH (ref 70–99)

## 2022-03-13 LAB — BASIC METABOLIC PANEL
Anion gap: 8 (ref 5–15)
BUN: 18 mg/dL (ref 8–23)
CO2: 25 mmol/L (ref 22–32)
Calcium: 8.8 mg/dL — ABNORMAL LOW (ref 8.9–10.3)
Chloride: 104 mmol/L (ref 98–111)
Creatinine, Ser: 1.22 mg/dL (ref 0.61–1.24)
GFR, Estimated: >60 mL/min (ref >60)
Glucose, Bld: 113 mg/dL — ABNORMAL HIGH (ref 70–99)
Potassium: 3.8 mmol/L (ref 3.5–5.1)
Sodium: 137 mmol/L (ref 135–145)

## 2022-03-13 LAB — CBC
HCT: 26.8 % — ABNORMAL LOW (ref 39.0–52.0)
Hemoglobin: 9 g/dL — ABNORMAL LOW (ref 13.0–17.0)
MCH: 31.9 pg (ref 26.0–34.0)
MCHC: 33.6 g/dL (ref 30.0–36.0)
MCV: 95 fL (ref 80.0–100.0)
Platelets: 406 10*3/uL — ABNORMAL HIGH (ref 150–400)
RBC: 2.82 MIL/uL — ABNORMAL LOW (ref 4.22–5.81)
RDW: 13.6 % (ref 11.5–15.5)
WBC: 10.4 10*3/uL (ref 4.0–10.5)
nRBC: 0 % (ref 0.0–0.2)

## 2022-03-13 MED ORDER — RAMIPRIL 1.25 MG PO CAPS: 2.5000 mg | ORAL_CAPSULE | Freq: Two times a day (BID) | ORAL | Status: DC

## 2022-03-13 MED ORDER — RAMIPRIL 5 MG PO CAPS
5.0000 mg | ORAL_CAPSULE | Freq: Two times a day (BID) | ORAL | Status: DC
  Administered 2022-03-13 – 2022-03-15 (×4): 5 mg via ORAL
  Filled 2022-03-13 (×4): qty 1

## 2022-03-13 MED ORDER — POTASSIUM CHLORIDE 20 MEQ PO PACK
20.0000 meq | PACK | Freq: Once | ORAL | Status: AC
  Administered 2022-03-13: 20 meq via ORAL
  Filled 2022-03-13: qty 1

## 2022-03-13 NOTE — Progress Notes (Signed)
PROGRESS NOTE   Subjective/Complaints: Was feeling sore today from therapy, took some tylenol and felt better afterward.  His wife was concerned that another patient on the unit had COVID  ROS: +insomnia- but pt admits to watching football game until 1am  + congestion (cold for a couple weeks) no cough or SOB +sore hip Objective:   No results found. Recent Labs    03/12/22 2352  WBC 10.4  HGB 9.0*  HCT 26.8*  PLT 406*   Recent Labs    03/13/22 0556  NA 137  K 3.8  CL 104  CO2 25  GLUCOSE 113*  BUN 18  CREATININE 1.22  CALCIUM 8.8*    Intake/Output Summary (Last 24 hours) at 03/13/2022 1429 Last data filed at 03/13/2022 1306 Gross per 24 hour  Intake 834 ml  Output 2750 ml  Net -1916 ml        Physical Exam: Vital Signs Blood pressure (!) 114/58, pulse 74, temperature 98.2 F (36.8 C), temperature source Oral, resp. rate 18, height 6' (1.829 m), weight 85.7 kg, SpO2 99 %.  Gen: no distress, normal appearing HEENT: oral mucosa pink and moist, NCAT Cardio: Reg rate Chest: normal effort, normal rate of breathing Abd: soft, non-distended Ext: no edema Psych: pleasant, normal affect Skin: intact   Musculoskeletal:     Comments: Min edema left thigh. Surgical dressing left hip.   Neurological:     Mental Status: He is alert.  Skin: surgical dressing to left hip Psych: pleasant, friednly   Assessment/Plan: 1. Functional deficits which require 3+ hours per day of interdisciplinary therapy in a comprehensive inpatient rehab setting. Physiatrist is providing close team supervision and 24 hour management of active medical problems listed below. Physiatrist and rehab team continue to assess barriers to discharge/monitor patient progress toward functional and medical goals  Care Tool:  Bathing    Body parts bathed by patient: Right arm, Left arm, Chest, Abdomen, Front perineal area, Right upper leg,  Left upper leg, Right lower leg, Face, Buttocks, Left lower leg   Body parts bathed by helper: Left lower leg, Buttocks     Bathing assist Assist Level: Supervision/Verbal cueing     Upper Body Dressing/Undressing Upper body dressing   What is the patient wearing?: Pull over shirt    Upper body assist Assist Level: Set up assist    Lower Body Dressing/Undressing Lower body dressing      What is the patient wearing?: Underwear/pull up, Pants     Lower body assist Assist for lower body dressing: Minimal Assistance - Patient > 75%     Toileting Toileting    Toileting assist Assist for toileting: Minimal Assistance - Patient > 75%     Transfers Chair/bed transfer  Transfers assist     Chair/bed transfer assist level: Supervision/Verbal cueing     Locomotion Ambulation   Ambulation assist      Assist level: Supervision/Verbal cueing Assistive device: Walker-rolling Max distance: 402 ft   Walk 10 feet activity   Assist     Assist level: Supervision/Verbal cueing Assistive device: Walker-rolling   Walk 50 feet activity   Assist    Assist level: Supervision/Verbal cueing Assistive  device: Walker-rolling    Walk 150 feet activity   Assist Walk 150 feet activity did not occur: Safety/medical concerns (fatigue, weakness)  Assist level: Supervision/Verbal cueing Assistive device: Walker-rolling    Walk 10 feet on uneven surface  activity   Assist Walk 10 feet on uneven surfaces activity did not occur: Safety/medical concerns (fatigue, weakness)   Assist level: Supervision/Verbal cueing Assistive device: Walker-rolling   Wheelchair     Assist Is the patient using a wheelchair?: Yes Type of Wheelchair: Manual    Wheelchair assist level: Supervision/Verbal cueing Max wheelchair distance: 164f    Wheelchair 50 feet with 2 turns activity    Assist        Assist Level: Supervision/Verbal cueing   Wheelchair 150 feet  activity     Assist      Assist Level: Supervision/Verbal cueing   Blood pressure (!) 114/58, pulse 74, temperature 98.2 F (36.8 C), temperature source Oral, resp. rate 18, height 6' (1.829 m), weight 85.7 kg, SpO2 99 %.    Medical Problem List and Plan: 1. Functional deficits secondary to left hip fracture             -patient may shower             -ELOS/Goals: 5-6 days modI             Continue CIR 2.  Antithrombotics: -DVT/anticoagulation:  Pharmaceutical: Lovenox             -antiplatelet therapy: Plavix 3. Pain Management: Oxycodone prn.  4. Insomnia: decrease Sinemet to daily.  5. Neuropsych/cognition: This patient maybe capable of making decisions on his own behalf. 6. Left hip incision: staples can be removed today, discussed with patient, removal order placed 7. Fluids/Electrolytes/Nutrition: Monitor I/O. Check CMET in am 8. HTN: Monitor BP TID--continue Altace and metoprolol Vitals:   03/13/22 0428 03/13/22 1309  BP: (!) 162/78 (!) 114/58  Pulse: 87 74  Resp: 18 18  Temp: 98 F (36.7 C) 98.2 F (36.8 C)  SpO2: 98% 99%     9. H/o CVA/CAD:  Resume Plavix 10. T2DM: Monitor BS ac/hs. Continue Actos and Jardiance/Metformin  CBG (last 3)  Recent Labs    03/12/22 2052 03/13/22 0609 03/13/22 1203  GLUCAP 150* 109* 164*    11. Hypokalemia: Recheck in am 12. ABLA: recheck in am. 13. 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 Suboptimal vitamin D level: start ergocalciferol 50,000U once per week for 7 weeks 16. Insomnia: continue trazodone prn 17, Suboptimal potassium: supplement 273m today  LOS: 5 days A FACE TO FACE EVALUATION WAS PERFORMED  KrMartha Clan Tamyra Fojtik 03/13/2022, 2:29 PM

## 2022-03-13 NOTE — Telephone Encounter (Signed)
Placed order for neuropsych eval with Dr. Alphonzo Severance. (Memory/agitation/belligerant).

## 2022-03-13 NOTE — Addendum Note (Signed)
Addended by: Oliver Hum S on: 03/13/2022 12:07 PM   Modules accepted: Orders

## 2022-03-13 NOTE — Progress Notes (Signed)
Occupational Therapy Discharge Summary  Patient Details  Name: Joel Gonzales. MRN: [id number] Date of Birth: 1945/10/11  Date of Discharge from OT service:{Time; dates multiple:304500300}   Patient has met {NUMBERS 0-12:18577} of 7 long term goals due to improved activity tolerance, improved balance, ability to compensate for deficits, functional use of  LEFT lower extremity, improved awareness, and improved coordination.  Patient to discharge at overall {LOA:3049010} level.  Patient's care partner {care partner:[id number]} to provide the necessary {assistance:3041652} assistance at discharge.    Reasons goals not met: ***  Recommendation:  Patient will benefit from ongoing skilled OT services in outpatient setting to continue to advance functional skills in the area of BADL and iADL.  Equipment: TTB  Reasons for discharge: treatment goals met  Patient/family agrees with progress made and goals achieved: Yes  OT Discharge Precautions/Restrictions  Precautions Precautions: Fall Restrictions Weight Bearing Restrictions: No LLE Weight Bearing: Weight bearing as tolerated ADL ADL Eating: Set up Where Assessed-Eating: Wheelchair Grooming: Setup Where Assessed-Grooming: Sitting at sink Upper Body Bathing: Setup Where Assessed-Upper Body Bathing: Sitting at sink Lower Body Bathing: Minimal assistance Where Assessed-Lower Body Bathing: Sitting at sink, Standing at sink Upper Body Dressing: Setup Where Assessed-Upper Body Dressing: Wheelchair Lower Body Dressing: Minimal assistance Where Assessed-Lower Body Dressing: Sitting at sink, Standing at sink Toileting: Minimal assistance Where Assessed-Toileting: Glass blower/designer: Psychiatric nurse Method: Counselling psychologist: Tax adviser Method: Educational psychologist: Radio broadcast assistant, Energy manager: Pension scheme manager Method: Ambulating, Stand pivot Youth worker: Grab bars, Civil engineer, contracting with back Vision Baseline Vision/History: 1 Wears glasses Patient Visual Report: No change from baseline Vision Assessment?: No apparent visual deficits Perception  Perception: Within Functional Limits Praxis Praxis: Intact Cognition Cognition Overall Cognitive Status: Within Functional Limits for tasks assessed Arousal/Alertness: Awake/alert Orientation Level: Person;Place;Situation Person: Oriented Place: Oriented Situation: Oriented Memory: Appears intact Awareness: Appears intact Problem Solving: Appears intact Safety/Judgment: Appears intact Brief Interview for Mental Status (BIMS) Repetition of Three Words (First Attempt): 3 Temporal Orientation: Year: Correct Temporal Orientation: Month: Accurate within 5 days Temporal Orientation: Day: Correct Recall: "Sock": Yes, no cue required Recall: "Blue": Yes, no cue required Recall: "Bed": Yes, no cue required BIMS Summary Score: 15 Sensation Sensation Light Touch: Appears Intact Hot/Cold: Appears Intact Proprioception: Appears Intact Stereognosis: Not tested Coordination Gross Motor Movements are Fluid and Coordinated: No Fine Motor Movements are Fluid and Coordinated: Yes Coordination and Movement Description: 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 Finger Nose Finger Test: [facility] Motor    Mobility     Trunk/Postural Assessment     Balance   Extremity/Trunk Assessment RUE Assessment RUE Assessment: Within Functional Limits LUE Assessment LUE Assessment: Within Functional Limits   Kethan Papadopoulos E Jael Kostick, MS, OTR/L  03/13/2022, 11:18 AM

## 2022-03-13 NOTE — Progress Notes (Signed)
Occupational Therapy Session Note  Patient Details  Name: Joel Gonzales. MRN: [id number] Date of Birth: 04-19-1945  Today's Date: 03/13/2022 OT Individual Time: 1105-1200 & 3329-5188 OT Individual Time Calculation (min): 55 min & 68 min OT missed time: 7 min Missed time reason: nursing care (staples removal)  Short Term Goals: Week 1:  OT Short Term Goal 1 (Week 1): STG = LTG due to ELOS  Skilled Therapeutic Interventions/Progress Updates:  Session 1 Skilled OT intervention completed with focus on ambulatory endurance, dynamic balance, floor level retrieval of items in prep for d/c. Pt received upright in bed, agreeable to session. No pain reported during session.  Transitioned to EOB with supervision with time, tennis shoes donned with total A for time, supervision sit > stand using RW, then ambulatory transfer with supervision using RW <> ortho gym.   Pt participated in bean bag retrieval task from multi-level including floor level with pt able to retrieve with supervision for balance using RW with use of reacher. Did need cues for maintaining BLE positioning inside RW vs stepping out especially with sharp turns.  In stance without AD and CGA, pt completed 2x15 with 4 pound dowel of the following to promote dynamic balance and BUE strengthening needed for BADL independence: Chest press Bicep flexion Overhead press  Discussed recommendation of TTB vs BSC in shower at home to minimize falls and challenge with lifting BLE over tub threshold with pt in agreement.  Back in room, pt doffed pants with supervision at EOB, total A for shoes, and then transitioned into bed with supervision with time needed for LLE management. Pt remained upright in bed, with bed alarm on/activated, and with all needs in reach at end of session.  Session 2 Skilled OT intervention completed with focus on functional endurance and safety within a shower context. Pt received supine in bed, agreeable to session.  No pain reported.  Completed transition to EOB with time, but only CGA for trunk control as pt has posterior lean in sitting when first getting up. Supervision sit > stand using RW, then supervision ambulatory transfer with RW to shower chair. Waterproof cover applied to L hip prior to shower.   Able to bathe with overall set up A, with use of LH sponge for LLE. Supervision needed for stand using grab bar to wash posterior peri-area. CGA stand pivot to w/c with use of grab bar.    Able to complete UB dressing with set up A, LB dressing with overall supervision use of reacher and sock aid for LB dressing at the sit > stand level RW.  Completed ambulatory transfer using RW and supervision <> tub room with wife in attendance to observe pt's gait pattern. Pt was able to complete mod I sit pivot transfer <> tub bench.   Pt's wife present with education provided on the following: -use of adaptive equipment for LB dressing to enable independence with handout given for private purchase for home -arrangement for adapt to reorder the tub bench for home as the wife declined receiving this at first prior to understanding why pt needs it -tub bench purpose and mechanism as well as installation features -curtain tuck method for prevention of water spillage/falls -use of RW for walking to correct pt's gait pattern and balance as she verbalizes that he is walking much better than he even did before the fall  Pt returned to EOB, with nursing at bedside for removal of staples with direct care hand off to nursing for nursing  care therefore pt missed 7 mins of OT intervention. Will attempt to make up missed time as able.   Therapy Documentation Precautions:  Precautions Precautions: Fall Restrictions Weight Bearing Restrictions: No LLE Weight Bearing: Weight bearing as tolerated    Therapy/Group: Individual Therapy  Blase Mess, MS, OTR/L  03/13/2022, 3:40 PM

## 2022-03-13 NOTE — Progress Notes (Signed)
Physical Therapy Session Note  Patient Details  Name: Joel Gonzales. MRN: [id number] Date of Birth: 11/09/1945  Today's Date: 03/13/2022 PT Individual Time: 6256-3893 PT Individual Time Calculation (min): 69 min   Short Term Goals: Week 1:  PT Short Term Goal 1 (Week 1): STG=LTG due to LOS  Skilled Therapeutic Interventions/Progress Updates:   Received pt sitting in WC, pt agreeable to PT treatment, and denied any pain during session but reported feeling sore - RN notified of request for medication at end of session. Session with emphasis on functional mobility/transfers, generalized strengthening and endurance, dynamic standing balance/coordination, simulated car transfers, and gait training. Pt transported to dayroom in [facility] dependently for energy conservation purposes and per pt request. Pt transferred on/off Nustep without AD and close supervision and performed BUE/LE strengthening on Nustep at workload 5 for 10 minutes for a total of 633 steps with emphasis on hip ROM and cardiovascular endurance.   Pt then performed ambulatory simulated car transfer with RW and supervision with assist from UEs to get LLE into/out of car. Pt then ambulated 6f on uneven surfaces (ramp) with RW and supervision. Pt performed standing alternating toe taps to 3in step 2x20 with BUE support and supervision with emphasis on hip flexor strengthening. Transitioned from sit<>semi-reclined on wedge and performed the following exercises with emphasis on LLE strength/ROM: -AAROM SLR on LLE 2x10  -AAROM hip abduction on LLE 2x10 with pillowcase to eliminate friction -AAROM heel slides on LLE 2x18 with pillowcase to eliminate friction  -bridges 2x10 Pt transferred semi-reclined<>sitting EOM with min HHA and stood with RW and supervision and ambulated 1039fwith RW and supervision back to room. Pt requested to return to bed and doffed shoes with max A and transferred sit<>supine with close supervision. Concluded session  with pt semi-reclined in bed, needs within reach, and bed alarm on.   Therapy Documentation Precautions:  Precautions Precautions: Fall Restrictions Weight Bearing Restrictions: No LLE Weight Bearing: Weight bearing as tolerated  Therapy/Group: Individual Therapy AnAlfonse AlpersT, DPT  03/13/2022, 6:59 AM

## 2022-03-13 NOTE — Telephone Encounter (Signed)
Referral sent to Dr. Peter Stewart, phone # [phone]. 

## 2022-03-14 ENCOUNTER — Telehealth: Payer: Self-pay | Admitting: Neurology

## 2022-03-14 DIAGNOSIS — S72142A Displaced intertrochanteric fracture of left femur, initial encounter for closed fracture: Secondary | ICD-10-CM | POA: Diagnosis not present

## 2022-03-14 LAB — GLUCOSE, CAPILLARY
Glucose-Capillary: 115 mg/dL — ABNORMAL HIGH (ref 70–99)
Glucose-Capillary: 174 mg/dL — ABNORMAL HIGH (ref 70–99)
Glucose-Capillary: 134 mg/dL — ABNORMAL HIGH (ref 70–99)
Glucose-Capillary: 156 mg/dL — ABNORMAL HIGH (ref 70–99)

## 2022-03-14 LAB — SARS CORONAVIRUS 2 BY RT PCR: SARS Coronavirus 2 by RT PCR: NEGATIVE

## 2022-03-14 MED ORDER — SENNOSIDES-DOCUSATE SODIUM 8.6-50 MG PO TABS
2.0000 | ORAL_TABLET | Freq: Every day | ORAL | 0 refills | Status: DC
  Filled 2022-03-14: qty 60, 30d supply, fill #0

## 2022-03-14 MED ORDER — VITAMIN D (ERGOCALCIFEROL) 1.25 MG (50000 UNIT) PO CAPS
50000.0000 [IU] | ORAL_CAPSULE | ORAL | 0 refills | Status: DC
  Filled 2022-03-14: qty 5, 35d supply, fill #0

## 2022-03-14 MED ORDER — METHOCARBAMOL 500 MG PO TABS
500.0000 mg | ORAL_TABLET | Freq: Four times a day (QID) | ORAL | 0 refills | Status: DC | PRN
  Filled 2022-03-14: qty 120, 30d supply, fill #0

## 2022-03-14 MED ORDER — PANTOPRAZOLE SODIUM 40 MG PO TBEC
40.0000 mg | DELAYED_RELEASE_TABLET | Freq: Every day | ORAL | 0 refills | Status: DC
  Filled 2022-03-14: qty 30, 30d supply, fill #0

## 2022-03-14 MED ORDER — ACETAMINOPHEN 500 MG PO TABS
500.0000 mg | ORAL_TABLET | Freq: Four times a day (QID) | ORAL | 0 refills | Status: DC | PRN
  Filled 2022-03-14: qty 30, 8d supply, fill #0

## 2022-03-14 MED ORDER — B COMPLEX-C PO TABS
1.0000 | ORAL_TABLET | Freq: Every day | ORAL | 0 refills | Status: DC
  Filled 2022-03-14: qty 30, 30d supply, fill #0

## 2022-03-14 NOTE — Telephone Encounter (Signed)
I called pt and relayed that Dr. Jaynee Eagles was ok to refer back to [facility] for any research or other assistance that the may be able to give.  He stated he will be going home tomorrow and start PT at [facility].  I relayed they will be able to assist him with balance and instruction to make things easier for him.  He appreciated call back.   Will see how things go with PT and will let us know.  Appreciated call back.

## 2022-03-14 NOTE — Progress Notes (Addendum)
Patient ID: Joel Gonzales., male   DOB: 07/23/1945, 76 y.o.   MRN: [id number]  [facility] referral sent to [facility] Patient approved for Rn/PT/OT FU

## 2022-03-14 NOTE — Telephone Encounter (Signed)
Pt called wanting to know if he can discuss future treatment to find out what is going on with him and why he is losing balance and falling so much.

## 2022-03-14 NOTE — Progress Notes (Signed)
Inpatient Rehabilitation Discharge Medication Review by a Pharmacist  A complete drug regimen review was completed for this patient to identify any potential clinically significant medication issues.  High Risk Drug Classes Is patient taking? Indication by Medication  Antipsychotic No   Anticoagulant No   Antibiotic No   Opioid Yes Oxycodone - PRN pain  Antiplatelet Yes Clopidogrel-CAD  Hypoglycemics/insulin Yes Synjardy XR, Pioglitazone- DM  Vasoactive Medication Yes Metoprolol XL, Ramipril - BP  Chemotherapy No   Other Yes Atorvastatin - HLD Albuterol nebs - prn SOB Sinemet IR-Parkinson's  Methocarbamol - muscle spasms  Pantoprazole - Reflux Vitamin D - osteoporosis     Type of Medication Issue Identified Description of Issue Recommendation(s)  Drug Interaction(s) (clinically significant)     Duplicate Therapy     Allergy     No Medication Administration End Date     Incorrect Dose     Additional Drug Therapy Needed     Significant med changes from prior encounter (inform family/care partners about these prior to discharge).    Other       Clinically significant medication issues were identified that warrant physician communication and completion of prescribed/recommended actions by midnight of the next day:  No  Pharmacist comments: None  Time spent performing this drug regimen review (minutes):  30 minutes  Thank you. Anette Guarneri, PharmD

## 2022-03-14 NOTE — Progress Notes (Signed)
Occupational Therapy Session Note  Patient Details  Name: Joel Gonzales. MRN: [id number] Date of Birth: 19-Aug-1945  Today's Date: 03/14/2022 OT Individual Time: 1022-1120 & 1315-1430 OT Individual Time Calculation (min): 58 min & 75 min   Short Term Goals: Week 1:  OT Short Term Goal 1 (Week 1): STG = LTG due to ELOS  Skilled Therapeutic Interventions/Progress Updates:  Session 1 Skilled OT intervention completed with focus on ambulatory endurance, dynamic balance, cognitive sequencing. Pt received upright in recliner, agreeable to session. No pain reported during session.  Declined self-care needs. Mod I sit > stand using RW and ambulatory transfers using RW with mod I throughout session. Ambulated > ortho gym.   Standing at [facility] pt completed the following to address balance and cognitive sequencing needed for BADLs with mod I using RW in stance: -trail making A- 1:49 to complete, 3 errors; no difficulty and able to correct own mistakes with supervision -trail making B- 2:09 to complete, 3 errors; min difficulty with min cues needed for sequencing next step in the alternating pattern -speed dot reaction test- 29 hits, 2 misses, 2.05 sec reaction speed  Transferred to the nustep. Completed 12 mins on level 5 for cardiovascular and LLE endurance/ROM.   Ambulated back to room at prior method, and remained seated in recliner with wife present. Wife requested assist assembling the tub bench that was delivered to room, with therapist educating on how to make adjustments for environmental limitations/set up. Pt remained in recliner with all needs in reach at end of session.  Session 2 Skilled OT intervention completed with focus on ambulatory endurance within a community setting, memory in prep for IADL management, and BUE strengthening to promote erect posture with ambulation with RW. Pt received reclined back in recliner, agreeable to session. No pain reported.  Completed all sit >  stands and ambulatory transfers with RW with mod I.   Pt ambulated from room > elevators (supervision needed for balance in stance without back support) then seated rest break. Ambulated > gift shop > throughout gift shop > outside court yard and back inside to in front of panera with seated rest break prior to ambulating back to 4th floor gym space. Pt ambulated greater than 1500 ft in total during session with intermittent seated rest breaks only needed prior to upcoming long durations of walking but pt did not express fatigue with the activity.  Cues were needed for RW positioning on different surfaces including uneven tile with grout grooves, pavement, and rugs. Did have one occurrence of rug getting tucked up underneath his foot however pt was able to correct with CGA for balance while stepping over. Did need min A to remember 1/3 items from "shopping list" in gift shop when prompted.  Seated EOM, pt completed the following BUE exercises: (With red theraband) 12 reps Horizontal abduction Self-anchored shoulder flexion each arm Self-anchored bicep flexion each arm Self-anchored tricep extension each arm Therapist anchored scapular retraction each arm Shoulder external rotation Shoulder extension Shoulder diagonal pulls  Pt remained in bed, with bed alarm on/activated, and with all needs in reach at end of session.   Therapy Documentation Precautions:  Precautions Precautions: Fall Restrictions Weight Bearing Restrictions: No LLE Weight Bearing: Weight bearing as tolerated    Therapy/Group: Individual Therapy  Blase Mess, MS, OTR/L  03/14/2022, 2:36 PM

## 2022-03-14 NOTE — Progress Notes (Signed)
Patient ID: Joel Gonzales., male   DOB: Jun 03, 1945, 76 y.o.   MRN: [id number]  SW met with patient and spouse in room to discuss d/c. Patient DME WC and TTB arrived in room, spouse will take home today. No additional questions or concerns.

## 2022-03-14 NOTE — Discharge Instructions (Addendum)
Inpatient Rehab Discharge Instructions  Joel Gonzales. Discharge date and time: 03/15/22   Activities/Precautions/ Functional Status: Activity: no lifting, driving, or strenuous exercise till cleared by MD Diet: diabetic diet Wound Care: keep wound clean and dry. Contact Dr. Ninfa Linden if you develop any problems with your incision/wound--redness, swelling, increase in pain, drainage or if you develop fever or chills.    Functional status:  ___ No restrictions     ___ Walk up steps independently ___ 24/7 supervision/assistance   ___ Walk up steps with assistance ___ Intermittent supervision/assistance  ___ Bathe/dress independently ___ Walk with walker     ___ Bathe/dress with assistance ___ Walk Independently    ___ Shower independently ___ Walk with assistance    ___ Shower with assistance _X__ No alcohol     ___ Return to work/school ________  COMMUNITY REFERRALS UPON DISCHARGE:    Home Health:   PT     OT         RN                    Agency: [facility]  Phone: [phone]    Medical Equipment/Items Ordered: Wheelchair and Tub Transfer Bench                                                 Agency/Supplier: [employer] [phone]   Special Instructions: Please allow 24-72 hours for [facility] to reach out for scheduling.     My questions have been answered and I understand these instructions. I will adhere to these goals and the provided educational materials after my discharge from the hospital.  Patient/Caregiver Signature _______________________________ Date __________  Clinician Signature _______________________________________ Date __________  Please bring this form and your medication list with you to all your follow-up doctor's appointments.

## 2022-03-14 NOTE — Progress Notes (Signed)
Inpatient Rehabilitation Care Coordinator Discharge Note   Patient Details  Name: Joel Gonzales. MRN: [id number] Date of Birth: 09-18-1945   Discharge location: Home  Length of Stay: 6 Days  Discharge activity level: sup  Home/community participation: spouse  Patient response XK:GYJEHU Literacy - How often do you need to have someone help you when you read instructions, pamphlets, or other written material from your doctor or pharmacy?: Never  Patient response DJ:SHFWYO Isolation - How often do you feel lonely or isolated from those around you?: Never  Services provided included: SW, Neuropsych, Pharmacy, TR, CM, RN, SLP, OT, PT, RD, MD  Financial Services:  Financial Services Utilized: Medicare    Choices offered to/list presented to:    Follow-up services arranged:  [place] RN PT OT         Patient response to transportation need: Is the patient able to respond to transportation needs?: Yes In the past 12 months, has lack of transportation kept you from medical appointments or from getting medications?: No In the past 12 months, has lack of transportation kept you from meetings, work, or from getting things needed for daily living?: No    Comments (or additional information):  Patient/Family verbalized understanding of follow-up arrangements:  Yes  Individual responsible for coordination of the follow-up plan: Bertie (spouse)  Confirmed correct DME delivered: Dyanne Iha 03/14/2022    Dyanne Iha

## 2022-03-14 NOTE — Progress Notes (Signed)
Physical Therapy Discharge Summary  Patient Details  Name: Joel Gonzales. MRN: [id number] Date of Birth: 1946/02/27  Date of Discharge from PT service:March 14, 2022  Today's Date: 03/14/2022 PT Individual Time: 0800-0910 PT Individual Time Calculation (min): 70 min   Patient has met 8 of 8 long term goals due to improved activity tolerance, improved balance, improved postural control, increased strength, increased range of motion, and improved coordination. Patient to discharge at an ambulatory level Modified Independent using RW. Patient's care partner is independent to provide the necessary physical assistance at discharge.  All goals met   Recommendation:  Patient will benefit from ongoing skilled PT services in outpatient setting to continue to advance safe functional mobility, address ongoing impairments in transfers, generalized strengthening and endurance, dynamic standing balance/coordination, gait training, and to minimize fall risk.  Equipment: RW  Reasons for discharge: treatment goals met  Patient/family agrees with progress made and goals achieved: Yes  Today's Interventions: Received pt sitting in WC, pt agreeable to PT treatment, and denied any pain during session. Session with emphasis on discharge planning, functional mobility/transfers, generalized strengthening and endurance, dynamic standing balance/coordination, gait training, and stair navigation. Went through sensation, MMT, and pain interference questionnaire. Pt requesting a WC for longer community distance; suggested rollator however pt declined - secure chatted CSW. Pt also requesting COVID test prior to D/C - notified MD. Pt performed WC mobility 189f using BUE and supervision to main therapy gym with emphasis on UE strength. Pt navigated 12 6in steps with L handrail and supervision using a lateral stepping technique to simulate home environment. Pt able to stand and pick up tissue box from floor using RW  and CGA. Pt [facility] and mod I and ambulated 159fwith RW and mod I to dayroom. Pt performed the following exercises standing with BUE support on RW with supervision with emphasis on LE strength/ROM: -heel raises 2x10 -alternating marches 2x20 -hip extension 2x15 bilaterally -hip abduction 2x15 bilaterally  Pt then performed standing LLE heel taps to 3in step 2x15 reps with emphasis on L hip flexor strength. Pt stood with RW and mod I and ambulated 30050fith RW and mod I back to room - pt able to carry on conversation with MD while ambulating back to room. Concluded session with pt sitting in recliner with all needs within reach. Pt requesting Tylenol - RN aware. Pt made mod I in room.   PT Discharge Precautions/Restrictions Precautions Precautions: Fall Restrictions Weight Bearing Restrictions: No Pain Interference Pain Interference Pain Effect on Sleep: 1. Rarely or not at all Pain Interference with Therapy Activities: 1. Rarely or not at all Pain Interference with Day-to-Day Activities: 1. 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 Finger Nose Finger Test: WFLEun[facility] Hospitalel Shin Test: WFL[facility] RLE, decreased ROM on LLE but improved since eval Motor  Motor Motor: Other (comment) Motor - Skilled Clinical Observations: generalized weakness/deconditioning, 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  Mobility Bed Mobility Bed  Mobility: Rolling Right;Rolling Left;Sit to Supine;Supine to Sit Rolling Right: Supervision/verbal cueing Rolling Left: Supervision/Verbal cueing Supine to Sit: Supervision/Verbal cueing  Sit to Supine: Supervision/Verbal cueing Transfers Transfers: Sit to Stand;Stand to Sit;Stand Pivot Transfers Sit to Stand: Independent with assistive device Stand to Sit: Independent with assistive device Stand Pivot Transfers: Independent with assistive device Transfer (Assistive device): Rolling walker Locomotion  Gait Ambulation: Yes Gait Assistance: Independent with assistive device Gait Distance (Feet): 300 Feet Assistive device: Rolling walker Gait Gait: Yes Gait Pattern: Impaired Gait Pattern: Step-through pattern;Decreased stride length;Decreased weight shift to left;Poor foot clearance - left;Poor foot clearance - right Gait velocity: decreased Stairs / Additional Locomotion Stairs: Yes Stairs Assistance: Supervision/Verbal cueing Stair Management Technique: One rail Left Number of Stairs: 12 Height of Stairs: 6 Ramp: Supervision/Verbal cueing (RW) Pick up small object from the floor assist level: Contact Guard/Touching assist Pick up small object from the floor assistive device: tissue box using RW Wheelchair Mobility Wheelchair Mobility: Yes Wheelchair Assistance: Independent with Camera operator: Both upper extremities Wheelchair Parts Management: Needs assistance Distance: 120f  Trunk/Postural Assessment  Cervical Assessment Cervical Assessment: Exceptions to [facility](forward head) Thoracic Assessment Thoracic Assessment: Exceptions to WCascade Surgery Center LLC(rounded shoulders) Lumbar Assessment Lumbar Assessment: Exceptions to WAtl[facility] LLC(posterior pelvic tilt)  Balance Balance Balance Assessed: Yes Static Sitting Balance Static Sitting - Balance Support: Feet supported;No upper extremity supported Static Sitting - Level of Assistance: 7: Independent Dynamic Sitting  Balance Dynamic Sitting - Balance Support: Feet supported;No upper extremity supported Dynamic Sitting - Level of Assistance: 7: Independent Static Standing Balance Static Standing - Balance Support: Bilateral upper extremity supported;During functional activity (RW) Static Standing - Level of Assistance: 6: Modified independent (Device/Increase time) Dynamic Standing Balance Dynamic Standing - Balance Support: Bilateral upper extremity supported;During functional activity (RW) Dynamic Standing - Level of Assistance: 6: Modified independent (Device/Increase time) Dynamic Standing - Comments: with transfers and gait Extremity Assessment  RLE Assessment RLE Assessment: Exceptions to WLone [facility] LLCGeneral Strength Comments: tested in sitting RLE Strength Right Hip Flexion: 4/5 Right Hip ABduction: 4/5 Right Hip ADduction: 4/5 Right Knee Flexion: 4+/5 Right Knee Extension: 4/5 Right Ankle Dorsiflexion: 4+/5 Right Ankle Plantar Flexion: 4+/5 LLE Assessment LLE Assessment: Exceptions to WMcle[facility]-DarlingtonGeneral Strength Comments: tested in sitting LLE Strength Left Hip Flexion: 3+/5 Left Hip ABduction: 3+/5 Left Hip ADduction: 3+/5 Left Knee Flexion: 4-/5 Left Knee Extension: 3+/5 Left Ankle Dorsiflexion: 4+/5 Left Ankle Plantar Flexion: 4+/5  AAlfonse AlpersPT, DPT  03/14/2022, 7:24 AM

## 2022-03-14 NOTE — Discharge Summary (Signed)
Physician Discharge Summary  Patient ID: Joel Gonzales. MRN: [id number] DOB/AGE: 1945-07-11 76 y.o.  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[place])   DM2 (diabetes mellitus, type 2) (HCC)   Discharged Condition: good  Significant Diagnostic Studies:   Labs:  Basic Metabolic Panel: Recent Labs  Lab 03/13/22 0556  NA 137  K 3.8  CL 104  CO2 25  GLUCOSE 113*  BUN 18  CREATININE 1.22  CALCIUM 8.8*    CBC:    Latest Ref Rng & Units 03/12/2022   11:52 PM 03/09/2022    6:19 AM 03/07/2022    5:59 AM  CBC  WBC 4.0 - 10.5 K/uL 10.4  8.6  7.4   Hemoglobin 13.0 - 17.0 g/dL 9.0  10.7  11.2   Hematocrit 39.0 - 52.0 % 26.8  30.9  33.5   Platelets 150 - 400 K/uL 406  264  232      CBG: Recent Labs  Lab 03/14/22 0620 03/14/22 1143 03/14/22 1639 03/14/22 2101 03/15/22 0602  GLUCAP 115* 174* 134* 156* 115*    Brief HPI:   Joel Gonzales. is a 76 y.o. 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  He underwent left total hip arthroplasty on 03/03/22 and post op WBAT w/Lovenox for DVT prophylaxis. Therapy was ongoing and patient continued to be limited by posterior bias, pain and decrease in carryover. CIR was recommended due to functional decline.    Hospital Course: Bertel Venard. was admitted to rehab 03/08/2022 for inpatient therapies to consist of PT and OT at least three hours five days a week. Past admission physiatrist, therapy team and rehab RN have worked together to provide customized collaborative inpatient rehab.  Lovenox was used for DVT prophylaxis.  Follow up CBC showed drop in H/H without  signs of bleeding and recommend repeat CBC in 1-2 weeks after discharge.   Hip incision was healing well and staples were removed Blood pressures were monitored on TID basis and has been stable.  Sinemet was started per neurology recommendations but patient reported issues with insomnia therefore this was decreased to once a day..  Vitamin D levels were noted to be low therefore he was started on supplementation.  He was maintained on Mucinex twice daily to help with URI symptoms.  His diabetes has been monitored with ac/hs CBG checks and SSI was use prn for tighter BS control. Po intake has been good and BS were reasonably controlled overall. Pain control has improved with use of robaxin qid. He made good progress and is modified independent at discharge. He will continue to receive follow up HHPT and [place] [facility].    Rehab course: During patient's stay in rehab weekly team conferences were held to monitor patient's progress, set goals and discuss barriers to discharge. At admission, patient required min assist with ADL tasks and with mobility. He  has had improvement in activity tolerance, balance, postural control as well as ability to compensate for deficits. He is able to complete ADL tasks at modified independent level. He is independent for transfers and to ambulate 300' with use of rollater. He requires supervision to climb 6  stairs. Family education has been completed.    Discharge disposition: 01-Home or Self Care  Diet: Carb Modified.   Special Instructions: Recommend repeat CBC in 1-2 weeks after discharge.   Discharge Instructions     Ambulatory referral to Physical Medicine Rehab   Complete by: As directed       Allergies as of 03/15/2022   No Known Allergies      Medication List     STOP taking these medications    bisacodyl 10 MG suppository Commonly known as: DULCOLAX   docusate sodium 100 MG capsule Commonly known as: COLACE   oxyCODONE 5 MG  immediate release tablet Commonly known as: Oxy IR/ROXICODONE   senna 8.6 MG Tabs tablet Commonly known as: SENOKOT       TAKE these medications    Acetaminophen Extra Strength 500 MG Tabs Take 1 tablet (500 mg total) by mouth every 6 (six) hours as needed for moderate pain. What changed: how much to take   albuterol (2.5 MG/3ML) 0.083% nebulizer solution Commonly known as: PROVENTIL Take 3 mLs (2.5 mg total) by nebulization every 6 (six) hours as needed for wheezing or shortness of breath.   alendronate 70 MG tablet Commonly known as: FOSAMAX Take 70 mg by mouth once a week.   atorvastatin 80 MG tablet Commonly known as: LIPITOR Take 80 mg by mouth at bedtime.   B-complex with vitamin C tablet Take 1 tablet by mouth daily.   carbidopa-levodopa 25-100 MG tablet Commonly known as: SINEMET IR Take 1 tablet by mouth 2 (two) times daily. Take on an empty stomach or with crackers if makes nauseated, If have side effects try 1/2 tablet twice daily. Notes to patient: Increase to twice a day as advised by Dr. Jaynee Eagles.    clopidogrel 75 MG tablet Commonly known as: PLAVIX Take 1 tablet (75 mg total) by mouth daily.   guaiFENesin 600 MG 12 hr tablet Commonly known as: MUCINEX Take 1 tablet (600 mg total) by mouth 2 (two) times daily.   methocarbamol 500 MG tablet Commonly known as: ROBAXIN Take 1 tablet (500 mg total) by mouth 4 (four) times daily as needed for muscle spasms. What changed:  how much to take when to take this   metoprolol succinate 50 MG 24 hr tablet Commonly known as: TOPROL-XL TAKE 1 TABLET WITH OR IMMEDIATELY FOLLOWING A MEAL ONCE A DAY. What changed: See the new instructions.   pantoprazole 40 MG tablet Commonly known as: PROTONIX Take 1 tablet (40 mg total) by mouth daily.   pioglitazone 15 MG tablet Commonly known as: ACTOS Take 15 mg by mouth daily.   ramipril 5 MG capsule Commonly known as: ALTACE TAKE 1 CAPSULE BY MOUTH TWICE DAILY    Senexon-S 8.6-50 MG tablet Generic drug: senna-docusate Take 2 tablets by mouth daily at 6 (six) AM.   Synjardy XR 25-1000 MG Tb24 Generic drug: Empagliflozin-metFORMIN HCl ER Take 1 tablet by mouth daily.   Vitamin D (Ergocalciferol) 1.25 MG (50000 UNIT) Caps capsule Commonly known as: DRISDOL Take 1 capsule (50,000 Units total) by mouth every 7 (seven) days.        Follow-up Information     Tisovec, Fransico Him, MD. Call today.   Specialty: Internal Medicine Why: post hospital follow up Contact information: [place] [address] [phone]         Mcarthur Rossetti, MD. Call today.   Specialty: Orthopedic Surgery Why: for post op check. Contact information: [address]  [place] [address] [phone]         Izora Ribas, MD Follow up.   Specialty: Physical Medicine and Rehabilitation Why: office will call you with follow up appointment Contact information: [address] [place] [address] [phone]                 Signed: Bary Leriche 03/19/2022, 12:18 AM

## 2022-03-14 NOTE — Progress Notes (Signed)
PROGRESS NOTE   Subjective/Complaints: No new complaints this morning Wife would like for him to be tested for COVID prior to d/c He asks about return to driving  ROS: +insomnia- but pt admits to watching football game until 1am  + congestion (cold for a couple weeks) no cough or SOB +sore hip- denies pain Objective:   No results found. Recent Labs    03/12/22 2352  WBC 10.4  HGB 9.0*  HCT 26.8*  PLT 406*   Recent Labs    03/13/22 0556  NA 137  K 3.8  CL 104  CO2 25  GLUCOSE 113*  BUN 18  CREATININE 1.22  CALCIUM 8.8*    Intake/Output Summary (Last 24 hours) at 03/14/2022 0929 Last data filed at 03/14/2022 0700 Gross per 24 hour  Intake 717 ml  Output 2300 ml  Net -1583 ml        Physical Exam: Vital Signs Blood pressure 128/71, pulse 96, temperature 98.3 F (36.8 C), temperature source Oral, resp. rate 16, height 6' (1.829 m), weight 85.7 kg, SpO2 99 %.  Gen: no distress, normal appearing, BMI 25.62 HEENT: oral mucosa pink and moist, NCAT Cardio: Reg rate Chest: normal effort, normal rate of breathing Abd: soft, non-distended Ext: no edema Psych: pleasant, normal affect Skin: intact   Musculoskeletal:     Comments: Min edema left thigh. Surgical dressing left hip.   Neurological:     Mental Status: He is alert.  Skin: surgical dressing to left hip Psych: pleasant, friednly   Assessment/Plan: 1. Functional deficits which require 3+ hours per day of interdisciplinary therapy in a comprehensive inpatient rehab setting. Physiatrist is providing close team supervision and 24 hour management of active medical problems listed below. Physiatrist and rehab team continue to assess barriers to discharge/monitor patient progress toward functional and medical goals  Care Tool:  Bathing    Body parts bathed by patient: Right arm, Left arm, Chest, Abdomen, Front perineal area, Right upper leg, Left  upper leg, Right lower leg, Face, Buttocks, Left lower leg   Body parts bathed by helper: Left lower leg, Buttocks     Bathing assist Assist Level: Supervision/Verbal cueing     Upper Body Dressing/Undressing Upper body dressing   What is the patient wearing?: Pull over shirt    Upper body assist Assist Level: Set up assist    Lower Body Dressing/Undressing Lower body dressing      What is the patient wearing?: Underwear/pull up, Pants     Lower body assist Assist for lower body dressing: Minimal Assistance - Patient > 75%     Toileting Toileting    Toileting assist Assist for toileting: Minimal Assistance - Patient > 75%     Transfers Chair/bed transfer  Transfers assist     Chair/bed transfer assist level: Independent with assistive device Chair/bed transfer assistive device: Programmer, multimedia   Ambulation assist      Assist level: Independent with assistive device Assistive device: Walker-rolling Max distance: 329f   Walk 10 feet activity   Assist     Assist level: Independent with assistive device Assistive device: Walker-rolling   Walk 50 feet activity  Assist    Assist level: Independent with assistive device Assistive device: Walker-rolling    Walk 150 feet activity   Assist Walk 150 feet activity did not occur: Safety/medical concerns (fatigue, weakness)  Assist level: Independent with assistive device Assistive device: Walker-rolling    Walk 10 feet on uneven surface  activity   Assist Walk 10 feet on uneven surfaces activity did not occur: Safety/medical concerns (fatigue, weakness)   Assist level: Supervision/Verbal cueing Assistive device: Walker-rolling   Wheelchair     Assist Is the patient using a wheelchair?: Yes Type of Wheelchair: Manual    Wheelchair assist level: Supervision/Verbal cueing Max wheelchair distance: 173f    Wheelchair 50 feet with 2 turns activity    Assist         Assist Level: Supervision/Verbal cueing   Wheelchair 150 feet activity     Assist      Assist Level: Supervision/Verbal cueing   Blood pressure 128/71, pulse 96, temperature 98.3 F (36.8 C), temperature source Oral, resp. rate 16, height 6' (1.829 m), weight 85.7 kg, SpO2 99 %.    Medical Problem List and Plan: 1. Functional deficits secondary to left hip fracture             -patient may shower             -ELOS/Goals: 5-6 days modI             Continue CIR  Plan for home health for at least 1 week.  2.  Antithrombotics: -DVT/anticoagulation:  Pharmaceutical: Lovenox             -antiplatelet therapy: Plavix 3. Pain Management: Continue Oxycodone prn.  4. Insomnia: decrease Sinemet to daily.  5. Neuropsych/cognition: This patient maybe capable of making decisions on his own behalf. 6. Left hip incision: staples can be removed today, discussed with patient, removal order placed 7. Fluids/Electrolytes/Nutrition: Monitor I/O. Check CMET in am 8. HTN: Monitor BP TID--continue Altace and metoprolol Vitals:   03/13/22 2019 03/14/22 0512  BP: 128/71 128/71  Pulse: 74 96  Resp: 18 16  Temp: 98.4 F (36.9 C) 98.3 F (36.8 C)  SpO2: 99% 99%     9. H/o CVA/CAD:  Resume Plavix 10. T2DM: Monitor BS ac/hs. Continue Actos and Jardiance/Metformin. Provided list of foods for blood sugar control. CBG (last 3)  Recent Labs    03/13/22 1627 03/13/22 2115 03/14/22 0620  GLUCAP 162* 167* 115*    11. Hypokalemia: Recheck in am 12. ABLA: recheck in am. 13. 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 Suboptimal vitamin D level: start ergocalciferol 50,000U once per week for 7 weeks 16. Insomnia: continue trazodone prn 17, Suboptimal potassium: supplement 255m today  LOS: 6 days A FACE TO FACE  EVALUATION WAS PERFORMED  KrClide Deutscheraulkar 03/14/2022, 9:29 AM

## 2022-03-14 NOTE — Progress Notes (Signed)
Patient ID: Joel Fallin., male   DOB: 06-09-1945, 76 y.o.   MRN: [id number]  18x18 WC ordered through [employer].

## 2022-03-15 ENCOUNTER — Other Ambulatory Visit (HOSPITAL_COMMUNITY): Payer: Self-pay

## 2022-03-15 DIAGNOSIS — S72142A Displaced intertrochanteric fracture of left femur, initial encounter for closed fracture: Secondary | ICD-10-CM | POA: Diagnosis not present

## 2022-03-15 LAB — GLUCOSE, CAPILLARY: Glucose-Capillary: 115 mg/dL — ABNORMAL HIGH (ref 70–99)

## 2022-03-15 NOTE — Progress Notes (Signed)
Patient ID: Joel Gonzales., male   DOB: Oct 01, 1945, 76 y.o.   MRN: [id number]  Handicapped application provided to pt and spouse. No additional questions or concerns for d/c.

## 2022-03-15 NOTE — Progress Notes (Signed)
PROGRESS NOTE   Subjective/Complaints: Wife asks about nutrition for diabetes control Patient slept well last night Feels shuffling has decreased since starting Sinemet  ROS: +insomnia- but pt admits to watching football game until 1am  + congestion (cold for a couple weeks) no cough or SOB +sore hip- denies pain Objective:   No results found. Recent Labs    03/12/22 2352  WBC 10.4  HGB 9.0*  HCT 26.8*  PLT 406*   Recent Labs    03/13/22 0556  NA 137  K 3.8  CL 104  CO2 25  GLUCOSE 113*  BUN 18  CREATININE 1.22  CALCIUM 8.8*    Intake/Output Summary (Last 24 hours) at 03/15/2022 1011 Last data filed at 03/15/2022 0700 Gross per 24 hour  Intake 600 ml  Output --  Net 600 ml        Physical Exam: Vital Signs Blood pressure 137/74, pulse 93, temperature 98.5 F (36.9 C), temperature source Oral, resp. rate 17, height 6' (1.829 m), weight 85.7 kg, SpO2 99 %.  Gen: no distress, normal appearing, BMI 25.62 HEENT: oral mucosa pink and moist, NCAT Cardio: Reg rate Chest: normal effort, normal rate of breathing Abd: soft, non-distended Ext: no edema Psych: pleasant, normal affect Skin: intact   Musculoskeletal:     Comments: Min edema left thigh.  Neurological:     Mental Status: He is alert.  Skin: left hip incision with staples removed Psych: pleasant, friednly   Assessment/Plan: 1. Functional deficits which require 3+ hours per day of interdisciplinary therapy in a comprehensive inpatient rehab setting. Physiatrist is providing close team supervision and 24 hour management of active medical problems listed below. Physiatrist and rehab team continue to assess barriers to discharge/monitor patient progress toward functional and medical goals  Care Tool:  Bathing    Body parts bathed by patient: Right arm, Left arm, Chest, Abdomen, Front perineal area, Right upper leg, Left upper leg, Right  lower leg, Face, Buttocks, Left lower leg   Body parts bathed by helper: Left lower leg, Buttocks     Bathing assist Assist Level: Independent with assistive device     Upper Body Dressing/Undressing Upper body dressing   What is the patient wearing?: Pull over shirt    Upper body assist Assist Level: Independent with assistive device    Lower Body Dressing/Undressing Lower body dressing      What is the patient wearing?: Underwear/pull up, Pants     Lower body assist Assist for lower body dressing: Independent with assitive device Assistive Device Comment: reacher   Toileting Toileting    Toileting assist Assist for toileting: Independent with assistive device     Transfers Chair/bed transfer  Transfers assist     Chair/bed transfer assist level: Independent with assistive device Chair/bed transfer assistive device: Programmer, multimedia   Ambulation assist      Assist level: Independent with assistive device Assistive device: Walker-rolling Max distance: 373f   Walk 10 feet activity   Assist     Assist level: Independent with assistive device Assistive device: Walker-rolling   Walk 50 feet activity   Assist    Assist level: Independent with assistive  device Assistive device: Walker-rolling    Walk 150 feet activity   Assist Walk 150 feet activity did not occur: Safety/medical concerns (fatigue, weakness)  Assist level: Independent with assistive device Assistive device: Walker-rolling    Walk 10 feet on uneven surface  activity   Assist Walk 10 feet on uneven surfaces activity did not occur: Safety/medical concerns (fatigue, weakness)   Assist level: Supervision/Verbal cueing Assistive device: Walker-rolling   Wheelchair     Assist Is the patient using a wheelchair?: Yes Type of Wheelchair: Manual    Wheelchair assist level: Supervision/Verbal cueing Max wheelchair distance: 119f    Wheelchair 50 feet  with 2 turns activity    Assist        Assist Level: Supervision/Verbal cueing   Wheelchair 150 feet activity     Assist      Assist Level: Supervision/Verbal cueing   Blood pressure 137/74, pulse 93, temperature 98.5 F (36.9 C), temperature source Oral, resp. rate 17, height 6' (1.829 m), weight 85.7 kg, SpO2 99 %.    Medical Problem List and Plan: 1. Functional deficits secondary to left hip fracture             -patient may shower             -ELOS/Goals: 7 days modI             d/c home today  Plan for home health for at least 1 week.  2.  Antithrombotics: -DVT/anticoagulation:  Pharmaceutical: Lovenox             -antiplatelet therapy: Plavix 3. Pain Management: Continue Oxycodone prn.  4. Insomnia: decrease Sinemet to daily.  5. Neuropsych/cognition: This patient maybe capable of making decisions on his own behalf. 6. Left hip incision: staples can be removed today, discussed with patient, removal order placed 7. Fluids/Electrolytes/Nutrition: Monitor I/O. Check CMET in am 8. HTN: Monitor BP TID--continue Altace and metoprolol Vitals:   03/14/22 1938 03/15/22 0424  BP: 136/63 137/74  Pulse: 78 93  Resp: 18 17  Temp: 98 F (36.7 C) 98.5 F (36.9 C)  SpO2: 98% 99%     9. H/o CVA/CAD:  Resume Plavix 10. T2DM: Monitor BS ac/hs. Continue Actos and Jardiance/Metformin. Provided list of foods for blood sugar control. CBG (last 3)  Recent Labs    03/14/22 1639 03/14/22 2101 03/15/22 0602  GLUCAP 134* 156* 115*    11. Hypokalemia: Recheck in am 12. ABLA: recheck in am. 13. 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 Suboptimal vitamin D level: start ergocalciferol 50,000U once per week for 7 weeks 16. Insomnia: continue trazodone prn. Decreased Sinemet to daily 17, Suboptimal  potassium: supplement 242m 12/12, repeat outpatient 18. Overweight: BMI 25.62: provided dietary education   >30 minutes spent in discharge of patient including review of medications and follow-up appointments, physical examination, and in answering all patient's questions   LOS: 7 days A FACE TO FACE EVALUATION [place]/13/2023, 10:11 AM

## 2022-03-16 DIAGNOSIS — I10 Essential (primary) hypertension: Secondary | ICD-10-CM | POA: Diagnosis not present

## 2022-03-16 DIAGNOSIS — Z96643 Presence of artificial hip joint, bilateral: Secondary | ICD-10-CM | POA: Diagnosis not present

## 2022-03-16 DIAGNOSIS — Z9181 History of falling: Secondary | ICD-10-CM | POA: Diagnosis not present

## 2022-03-16 DIAGNOSIS — I4510 Unspecified right bundle-branch block: Secondary | ICD-10-CM | POA: Diagnosis not present

## 2022-03-16 DIAGNOSIS — Z7984 Long term (current) use of oral hypoglycemic drugs: Secondary | ICD-10-CM | POA: Diagnosis not present

## 2022-03-16 DIAGNOSIS — Z951 Presence of aortocoronary bypass graft: Secondary | ICD-10-CM | POA: Diagnosis not present

## 2022-03-16 DIAGNOSIS — E119 Type 2 diabetes mellitus without complications: Secondary | ICD-10-CM | POA: Diagnosis not present

## 2022-03-16 DIAGNOSIS — G214 Vascular parkinsonism: Secondary | ICD-10-CM | POA: Diagnosis not present

## 2022-03-16 DIAGNOSIS — S72062D Displaced articular fracture of head of left femur, subsequent encounter for closed fracture with routine healing: Secondary | ICD-10-CM | POA: Diagnosis not present

## 2022-03-16 DIAGNOSIS — I2510 Atherosclerotic heart disease of native coronary artery without angina pectoris: Secondary | ICD-10-CM | POA: Diagnosis not present

## 2022-03-16 DIAGNOSIS — E785 Hyperlipidemia, unspecified: Secondary | ICD-10-CM | POA: Diagnosis not present

## 2022-03-16 DIAGNOSIS — Z7901 Long term (current) use of anticoagulants: Secondary | ICD-10-CM | POA: Diagnosis not present

## 2022-03-16 NOTE — Patient Care Conference (Signed)
Inpatient RehabilitationTeam Conference and Plan of Care Update Date: 03/15/2022   Time: 10:33 AM   Patient Name: Joel Gonzales.      Medical Record Number: [id number]  Date of Birth: 08-09-1945 Sex: Male         Room/Bed: 4M05C/4M05C-01 Payor Info: Payor: MEDICARE / Plan: MEDICARE PART A AND B / Product Type: *No Product type* /    Admit Date/Time:  03/08/2022  4:37 PM  Primary Diagnosis:  Hip fracture requiring operative repair [facility])  Hospital Problems: Principal Problem:   Hip fracture requiring operative repair [facility]) Active Problems:   Essential hypertension   Gait disturbance   Vascular parkinsonism ([place])   DM2 (diabetes mellitus, type 2) [facility])    Expected Discharge Date: Expected Discharge Date: 03/15/22  Team Members Present: Physician leading conference: Dr. Leeroy Cha Social Worker Present: Erlene Quan, BSW Nurse Present: Tacy Learn, RN PT Present: Becky Sax, PT OT Present: Jennefer Bravo, OT     Current Status/Progress Goal Weekly Team Focus  Bowel/Bladder    Continent B/B   Remain continent   Toilet every 4 hours and PRN   Swallow/Nutrition/ Hydration               ADL's   Mod I all bathing, dressing and toileting   Goal level of Mod I- met 8/8 goals   d/c 12/12    Mobility   Mod I   Mod I - met all goals  D/C 2/12    Communication                Safety/Cognition/ Behavioral Observations               Pain    Pain managed with Tylenol   Less than 3 out of 10 on pain scale with PRN medications  Assess every 4 hours, prior to therapy and PRN   Skin    Incision to left hip without drainage.   Remain infection free  Assess skin every shift and PRN     Discharge Planning:  Discharging home tomorrow with spouse. DME delivered   Team Discussion: Hip fracture. Continent B/B. Pain controlled with Tylenol. Skin is CDI. Incision to left hip is without drainage. Staples removed 12/11. Patient at goal level with  therapies. Patient on target to meet rehab goals: yes  *See Care Plan and progress notes for long and short-term goals.   Revisions to Treatment Plan:  N/A  Teaching Needs: Medications, safety, gait/transfer training, skin/wound care, etc.  Current Barriers to Discharge: Decreased caregiver support  Possible Resolutions to Barriers: Family education completed and DME in room.      Medical Summary Current Status: parkinson's disease, hypokalemia, uncontrolled type 2 diabetes  Barriers to Discharge: Medical stability;Electrolyte abnormality;Uncontrolled Diabetes  Barriers to Discharge Comments: parkinson's disease, hypokalemia, uncontrolled type 2 diabetes Possible Resolutions to [employer] Focus: started sinemet, potassium supplemented, provided dietary educations, continue metformin   Continued Need for Acute Rehabilitation Level of Care: The patient requires daily medical management by a physician with specialized training in physical medicine and rehabilitation for the following reasons: Direction of a multidisciplinary physical rehabilitation program to maximize functional independence : Yes Medical management of patient stability for increased activity during participation in an intensive rehabilitation regime.: Yes Analysis of laboratory values and/or radiology reports with any subsequent need for medication adjustment and/or medical intervention. : Yes   I attest that I was present, lead the team conference, and concur with the assessment and plan of the  team.   Ernest Pine 03/15/2022, 10:33 AM

## 2022-03-17 DIAGNOSIS — Z23 Encounter for immunization: Secondary | ICD-10-CM | POA: Diagnosis not present

## 2022-03-19 NOTE — Discharge Summary (Incomplete)
Physician Discharge Summary  Patient ID: Joel Gonzales. MRN: [id number] DOB/AGE: 11/24/1945 76 y.o.  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 Low lung volumes without evidence of focal consolidation or pleural effusion. Thoracic spondylosis and bilateral glenohumeral/acromioclavicular osteoarthritis. Acute osseous abnormality IMPRESSION: 1. Low lung volumes without evidence of acute cardiopulmonary disease. 2. Cardiomegaly. Electronically Signed   By: Keane Police D.O.   On: 03/06/2022 09:24   DG Pelvis Portable  Result Date: 03/03/2022 CLINICAL DATA:  Status post left hip replacement. EXAM: PORTABLE PELVIS 1-2 VIEWS COMPARISON:  Hip CT yesterday. FINDINGS: Left hip arthroplasty in expected alignment. No periprosthetic lucency or fracture. Recent postsurgical change includes air and edema in the soft tissues. Lateral skin staples in place. IMPRESSION: Left hip arthroplasty without immediate postoperative complication. Electronically Signed   By: Keith Rake M.D.   On: 03/03/2022 16:14   DG HIP PORT UNILAT WITH PELVIS 1V LEFT  Result Date: 03/03/2022 CLINICAL DATA:  Intraoperative left hip replacement EXAM: DG HIP (WITH OR WITHOUT PELVIS) 1V PORT LEFT COMPARISON:  Pelvis and hip radiographs dated 03/02/2022 FINDINGS: Seven fluoroscopic images obtained during left hip arthroplasty. 16 seconds fluoro time utilized. Radiation dose 2.5358 mGy Kerma. Please see performing  physicians operative report for full details IMPRESSION: Fluoroscopic images were obtained for intraoperative guidance of left hip arthroplasty. Electronically Signed   By: Darrin Nipper M.D.   On: 03/03/2022 15:39   DG C-Arm 1-60 Min-No Report  Result Date: 03/03/2022 Fluoroscopy was utilized by the requesting physician.  No radiographic interpretation.   CT Hip Left Wo Contrast  Result Date: 03/02/2022 CLINICAL DATA:  Left femoral neck fracture after a fall EXAM: CT OF THE LEFT HIP WITHOUT CONTRAST TECHNIQUE: Multidetector CT imaging of the left hip was performed according to the standard protocol. Multiplanar CT image reconstructions were also generated. RADIATION DOSE REDUCTION: This exam was performed according to the departmental dose-optimization program which includes automated exposure control, adjustment of the mA and/or kV according to patient size and/or use of iterative reconstruction technique. COMPARISON:  03/02/2022 FINDINGS: Bones/Joint/Cartilage Acute intra-articular left femoral neck fracture with transcervical and subcapital components and mild apex anterior angulation. No other regional fracture identified. Currently no significant joint effusion. Mild spurring of the left SI joint anteriorly. Ligaments Suboptimally assessed by CT. Muscles and Tendons Unremarkable Soft tissues Aortoiliac atherosclerotic vascular calcification. IMPRESSION: 1. Acute intra-articular left femoral neck fracture with transcervical and subcapital components and mild apex anterior angulation. 2. Aortic atherosclerosis. Aortic Atherosclerosis (ICD10-I70.0). Electronically Signed   By: Van Clines M.D.   On: 03/02/2022 16:09   CT HEAD WO CONTRAST  Result Date: 03/02/2022 CLINICAL DATA:  Head trauma.  Fall. EXAM: CT HEAD WITHOUT CONTRAST CT MAXILLOFACIAL WITHOUT CONTRAST CT CERVICAL SPINE WITHOUT CONTRAST TECHNIQUE: Multidetector CT imaging of the head, cervical spine, and maxillofacial structures were  performed using the standard protocol without intravenous contrast. Multiplanar CT image reconstructions of the cervical spine and maxillofacial structures were also generated. RADIATION DOSE REDUCTION: This exam was performed according to  the departmental dose-optimization program which includes automated exposure control, adjustment of the mA and/or kV according to patient size and/or use of iterative reconstruction technique. COMPARISON:  CT head 02/17/2021 FINDINGS: CT HEAD FINDINGS Brain: Mild atrophy. Moderate white matter hypodensity diffusely with progression since 2022. Negative for acute infarct, hemorrhage, mass Vascular: Negative for hyperdense vessel Skull: Negative Other: None CT MAXILLOFACIAL FINDINGS Osseous: Fracture nasal bone with mild displacement. Nasal septum displaced to the right but without fracture. No other facial fracture. Negative mandible. Orbits: Negative for orbital fracture.  No orbital mass or edema. Sinuses: Mucosal edema in the paranasal sinuses. No air-fluid level. Mastoid and middle ear clear bilaterally Soft tissues: Mild soft tissue swelling of the nose. CT CERVICAL SPINE FINDINGS Alignment: Normal Skull base and vertebrae: Negative for fracture Soft tissues and spinal canal: Negative for soft tissue mass or edema. Disc levels: Mild disc degeneration and mild spurring throughout the cervical spine. No significant spinal stenosis Upper chest: Lung apices clear bilaterally Other: None IMPRESSION: 1. No acute intracranial abnormality. Atrophy and chronic microvascular ischemic change in the white matter. 2. Fracture of the nasal bone with mild displacement. No other facial fracture. 3. Negative for cervical spine fracture. Electronically Signed   By: Franchot Gallo M.D.   On: 03/02/2022 15:19   CT CERVICAL SPINE WO CONTRAST  Result Date: 03/02/2022 CLINICAL DATA:  Head trauma.  Fall. EXAM: CT HEAD WITHOUT CONTRAST CT MAXILLOFACIAL WITHOUT CONTRAST CT CERVICAL SPINE WITHOUT  CONTRAST TECHNIQUE: Multidetector CT imaging of the head, cervical spine, and maxillofacial structures were performed using the standard protocol without intravenous contrast. Multiplanar CT image reconstructions of the cervical spine and maxillofacial structures were also generated. RADIATION DOSE REDUCTION: This exam was performed according to the departmental dose-optimization program which includes automated exposure control, adjustment of the mA and/or kV according to patient size and/or use of iterative reconstruction technique. COMPARISON:  CT head 02/17/2021 FINDINGS: CT HEAD FINDINGS Brain: Mild atrophy. Moderate white matter hypodensity diffusely with progression since 2022. Negative for acute infarct, hemorrhage, mass Vascular: Negative for hyperdense vessel Skull: Negative Other: None CT MAXILLOFACIAL FINDINGS Osseous: Fracture nasal bone with mild displacement. Nasal septum displaced to the right but without fracture. No other facial fracture. Negative mandible. Orbits: Negative for orbital fracture.  No orbital mass or edema. Sinuses: Mucosal edema in the paranasal sinuses. No air-fluid level. Mastoid and middle ear clear bilaterally Soft tissues: Mild soft tissue swelling of the nose. CT CERVICAL SPINE FINDINGS Alignment: Normal Skull base and vertebrae: Negative for fracture Soft tissues and spinal canal: Negative for soft tissue mass or edema. Disc levels: Mild disc degeneration and mild spurring throughout the cervical spine. No significant spinal stenosis Upper chest: Lung apices clear bilaterally Other: None IMPRESSION: 1. No acute intracranial abnormality. Atrophy and chronic microvascular ischemic change in the white matter. 2. Fracture of the nasal bone with mild displacement. No other facial fracture. 3. Negative for cervical spine fracture. Electronically Signed   By: Franchot Gallo M.D.   On: 03/02/2022 15:19   CT Maxillofacial Wo Contrast  Result Date: 03/02/2022 CLINICAL DATA:  Head  trauma.  Fall. EXAM: CT HEAD WITHOUT CONTRAST CT MAXILLOFACIAL WITHOUT CONTRAST CT CERVICAL SPINE WITHOUT CONTRAST TECHNIQUE: Multidetector CT imaging of the head, cervical spine, and maxillofacial structures were performed using the standard protocol without intravenous contrast. Multiplanar CT image reconstructions of the cervical spine and maxillofacial structures were also generated. RADIATION DOSE REDUCTION: This exam was performed according to the departmental dose-optimization program which includes automated  exposure control, adjustment of the mA and/or kV according to patient size and/or use of iterative reconstruction technique. COMPARISON:  CT head 02/17/2021 FINDINGS: CT HEAD FINDINGS Brain: Mild atrophy. Moderate white matter hypodensity diffusely with progression since 2022. Negative for acute infarct, hemorrhage, mass Vascular: Negative for hyperdense vessel Skull: Negative Other: None CT MAXILLOFACIAL FINDINGS Osseous: Fracture nasal bone with mild displacement. Nasal septum displaced to the right but without fracture. No other facial fracture. Negative mandible. Orbits: Negative for orbital fracture.  No orbital mass or edema. Sinuses: Mucosal edema in the paranasal sinuses. No air-fluid level. Mastoid and middle ear clear bilaterally Soft tissues: Mild soft tissue swelling of the nose. CT CERVICAL SPINE FINDINGS Alignment: Normal Skull base and vertebrae: Negative for fracture Soft tissues and spinal canal: Negative for soft tissue mass or edema. Disc levels: Mild disc degeneration and mild spurring throughout the cervical spine. No significant spinal stenosis Upper chest: Lung apices clear bilaterally Other: None IMPRESSION: 1. No acute intracranial abnormality. Atrophy and chronic microvascular ischemic change in the white matter. 2. Fracture of the nasal bone with mild displacement. No other facial fracture. 3. Negative for cervical spine fracture. Electronically Signed   By: Franchot Gallo M.D.    On: 03/02/2022 15:19   DG Hip Unilat With Pelvis 2-3 Views Left  Result Date: 03/02/2022 CLINICAL DATA:  Fall. EXAM: DG HIP (WITH OR WITHOUT PELVIS) 2-3V LEFT COMPARISON:  None Available. FINDINGS: Possible subcapital nondisplaced fracture left femoral neck. 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 Recommend CT. Electronically Signed   By: Franchot Gallo M.D.   On: 03/02/2022 15:12   DG Chest Port 1 View  Result Date: 03/02/2022 CLINICAL DATA:  Fall EXAM: PORTABLE CHEST 1 VIEW COMPARISON:  02/17/2021. FINDINGS: Cardiac silhouette is enlarged, even for an AP view. No pneumonia or pulmonary edema. No pneumothorax or pleural effusion. Old healed posterior right-sided fifth rib. Median sternotomy wires and prior CABG. IMPRESSION: Enlarged cardiac silhouette.  Lungs are clear. Electronically Signed   By: Sammie Bench M.D.   On: 03/02/2022 15:11    Labs:  Basic Metabolic Panel: Recent Labs  Lab 03/13/22 0556  NA 137  K 3.8  CL 104  CO2 25  GLUCOSE 113*  BUN 18  CREATININE 1.22  CALCIUM 8.8*    CBC: Recent Labs  Lab 03/12/22 2352  WBC 10.4  HGB 9.0*  HCT 26.8*  MCV 95.0  PLT 406*    CBG: Recent Labs  Lab 03/14/22 0620 03/14/22 1143 03/14/22 1639 03/14/22 2101 03/15/22 0602  GLUCAP 115* 174* 134* 156* 115*    Brief HPI:   Joel Zoeller. is a 76 y.o. 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  He   Hospital Course: Joel Hudlow. was admitted to rehab 03/08/2022 for inpatient therapies to consist of PT and OT at least three hours five days a week. Past admission physiatrist, therapy team and rehab RN have worked together to provide customized collaborative inpatient  rehab.   Blood pressures were monitored on TID basis and   Diabetes has been monitored with ac/hs CBG checks and SSI was use prn for tighter BS control.    Rehab course: During patient's stay in rehab weekly team conferences were held to monitor patient's progress,  set goals and discuss barriers to discharge. At admission, patient required  He/She  has had improvement in activity tolerance, balance, postural control as well as ability to compensate for deficits. He/She has had improvement in functional use RUE/LUE  and RLE/LLE as well as improvement in awareness       Disposition:  Discharge disposition: 01-Home or Self Care        Diet:  Special Instructions:  Discharge Instructions     Ambulatory referral to Physical Medicine Rehab   Complete by: As directed       Allergies as of 03/15/2022   No Known Allergies      Medication List     STOP taking these medications    bisacodyl 10 MG suppository Commonly known as: DULCOLAX   docusate sodium 100 MG capsule Commonly known as: COLACE   oxyCODONE 5 MG immediate release tablet Commonly known as: Oxy IR/ROXICODONE   senna 8.6 MG Tabs tablet Commonly known as: SENOKOT       TAKE these medications    Acetaminophen Extra Strength 500 MG Tabs Take 1 tablet (500 mg total) by mouth every 6 (six) hours as needed for moderate pain. What changed: how much to take   albuterol (2.5 MG/3ML) 0.083% nebulizer solution Commonly known as: PROVENTIL Take 3 mLs (2.5 mg total) by nebulization every 6 (six) hours as needed for wheezing or shortness of breath.   alendronate 70 MG tablet Commonly known as: FOSAMAX Take 70 mg by mouth once a week.   atorvastatin 80 MG tablet Commonly known as: LIPITOR Take 80 mg by mouth at bedtime.   B-complex with vitamin C tablet Take 1 tablet by mouth daily.   carbidopa-levodopa 25-100 MG tablet Commonly known as: SINEMET IR Take 1 tablet by mouth 2 (two) times daily. Take  on an empty stomach or with crackers if makes nauseated, If have side effects try 1/2 tablet twice daily. Notes to patient: Increase to twice a day as advised by Dr. Jaynee Eagles.    clopidogrel 75 MG tablet Commonly known as: PLAVIX Take 1 tablet (75 mg total) by mouth daily.   guaiFENesin 600 MG 12 hr tablet Commonly known as: MUCINEX Take 1 tablet (600 mg total) by mouth 2 (two) times daily.   methocarbamol 500 MG tablet Commonly known as: ROBAXIN Take 1 tablet (500 mg total) by mouth 4 (four) times daily as needed for muscle spasms. What changed:  how much to take when to take this   metoprolol succinate 50 MG 24 hr tablet Commonly known as: TOPROL-XL TAKE 1 TABLET WITH OR IMMEDIATELY FOLLOWING A MEAL ONCE A DAY. What changed: See the new instructions.   pantoprazole 40 MG tablet Commonly known as: PROTONIX Take 1 tablet (40 mg total) by mouth daily.   pioglitazone 15 MG tablet Commonly known as: ACTOS Take 15 mg by mouth daily.   ramipril 5 MG capsule Commonly known as: ALTACE TAKE 1 CAPSULE BY MOUTH TWICE DAILY   Senexon-S 8.6-50 MG tablet Generic drug: senna-docusate Take 2 tablets by mouth daily at 6 (six) AM.   Synjardy XR 25-1000 MG Tb24 Generic drug: Empagliflozin-metFORMIN HCl ER Take 1 tablet by mouth daily.   Vitamin D (Ergocalciferol) 1.25 MG (50000 UNIT) Caps capsule Commonly known as: DRISDOL Take 1 capsule (50,000 Units total) by mouth every 7 (seven) days.        Follow-up Information     Tisovec, Fransico Him, MD. Call today.   Specialty: Internal Medicine Why: post hospital follow  up Contact information: [address] [place] [address] [phone]         Mcarthur Rossetti, MD. Call today.   Specialty: Orthopedic Surgery Why: for post op check. Contact information: [place] [address] [phone]         Izora Ribas, MD Follow up.   Specialty: Physical Medicine and Rehabilitation Why: office  will call you with follow up appointment Contact information: [address] [place] [address] [phone]                 Signed: Bary Leriche 03/18/2022, 11:58 PM

## 2022-03-20 DIAGNOSIS — S72062D Displaced articular fracture of head of left femur, subsequent encounter for closed fracture with routine healing: Secondary | ICD-10-CM | POA: Diagnosis not present

## 2022-03-20 DIAGNOSIS — I4510 Unspecified right bundle-branch block: Secondary | ICD-10-CM | POA: Diagnosis not present

## 2022-03-20 DIAGNOSIS — I2510 Atherosclerotic heart disease of native coronary artery without angina pectoris: Secondary | ICD-10-CM | POA: Diagnosis not present

## 2022-03-20 DIAGNOSIS — E785 Hyperlipidemia, unspecified: Secondary | ICD-10-CM | POA: Diagnosis not present

## 2022-03-20 DIAGNOSIS — E119 Type 2 diabetes mellitus without complications: Secondary | ICD-10-CM | POA: Diagnosis not present

## 2022-03-20 DIAGNOSIS — I10 Essential (primary) hypertension: Secondary | ICD-10-CM | POA: Diagnosis not present

## 2022-03-21 DIAGNOSIS — E785 Hyperlipidemia, unspecified: Secondary | ICD-10-CM | POA: Diagnosis not present

## 2022-03-21 DIAGNOSIS — S72062D Displaced articular fracture of head of left femur, subsequent encounter for closed fracture with routine healing: Secondary | ICD-10-CM | POA: Diagnosis not present

## 2022-03-21 DIAGNOSIS — E119 Type 2 diabetes mellitus without complications: Secondary | ICD-10-CM | POA: Diagnosis not present

## 2022-03-21 DIAGNOSIS — I4510 Unspecified right bundle-branch block: Secondary | ICD-10-CM | POA: Diagnosis not present

## 2022-03-21 DIAGNOSIS — I10 Essential (primary) hypertension: Secondary | ICD-10-CM | POA: Diagnosis not present

## 2022-03-21 DIAGNOSIS — I2510 Atherosclerotic heart disease of native coronary artery without angina pectoris: Secondary | ICD-10-CM | POA: Diagnosis not present

## 2022-03-22 ENCOUNTER — Telehealth: Payer: Self-pay | Admitting: *Deleted

## 2022-03-22 NOTE — Telephone Encounter (Signed)
Received application from patient for handicap parking placard in hopes that Dr Jaynee Eagles would approve it. This was signed by Dr Jaynee Eagles for permanent issuance. Patient was very Patent attorney.

## 2022-03-31 DIAGNOSIS — I10 Essential (primary) hypertension: Secondary | ICD-10-CM | POA: Diagnosis not present

## 2022-03-31 DIAGNOSIS — I2510 Atherosclerotic heart disease of native coronary artery without angina pectoris: Secondary | ICD-10-CM | POA: Diagnosis not present

## 2022-03-31 DIAGNOSIS — E785 Hyperlipidemia, unspecified: Secondary | ICD-10-CM | POA: Diagnosis not present

## 2022-03-31 DIAGNOSIS — S72062D Displaced articular fracture of head of left femur, subsequent encounter for closed fracture with routine healing: Secondary | ICD-10-CM | POA: Diagnosis not present

## 2022-03-31 DIAGNOSIS — I4510 Unspecified right bundle-branch block: Secondary | ICD-10-CM | POA: Diagnosis not present

## 2022-03-31 DIAGNOSIS — E119 Type 2 diabetes mellitus without complications: Secondary | ICD-10-CM | POA: Diagnosis not present

## 2022-04-01 DIAGNOSIS — E119 Type 2 diabetes mellitus without complications: Secondary | ICD-10-CM | POA: Diagnosis not present

## 2022-04-01 DIAGNOSIS — E785 Hyperlipidemia, unspecified: Secondary | ICD-10-CM | POA: Diagnosis not present

## 2022-04-01 DIAGNOSIS — S72062D Displaced articular fracture of head of left femur, subsequent encounter for closed fracture with routine healing: Secondary | ICD-10-CM | POA: Diagnosis not present

## 2022-04-01 DIAGNOSIS — I10 Essential (primary) hypertension: Secondary | ICD-10-CM | POA: Diagnosis not present

## 2022-04-01 DIAGNOSIS — I4510 Unspecified right bundle-branch block: Secondary | ICD-10-CM | POA: Diagnosis not present

## 2022-04-01 DIAGNOSIS — I2510 Atherosclerotic heart disease of native coronary artery without angina pectoris: Secondary | ICD-10-CM | POA: Diagnosis not present

## 2022-04-04 ENCOUNTER — Other Ambulatory Visit: Payer: Self-pay

## 2022-04-04 ENCOUNTER — Telehealth: Payer: Self-pay | Admitting: Orthopaedic Surgery

## 2022-04-04 DIAGNOSIS — I2510 Atherosclerotic heart disease of native coronary artery without angina pectoris: Secondary | ICD-10-CM | POA: Diagnosis not present

## 2022-04-04 DIAGNOSIS — I10 Essential (primary) hypertension: Secondary | ICD-10-CM | POA: Diagnosis not present

## 2022-04-04 DIAGNOSIS — E785 Hyperlipidemia, unspecified: Secondary | ICD-10-CM | POA: Diagnosis not present

## 2022-04-04 DIAGNOSIS — I4510 Unspecified right bundle-branch block: Secondary | ICD-10-CM | POA: Diagnosis not present

## 2022-04-04 DIAGNOSIS — E119 Type 2 diabetes mellitus without complications: Secondary | ICD-10-CM | POA: Diagnosis not present

## 2022-04-04 DIAGNOSIS — S72062D Displaced articular fracture of head of left femur, subsequent encounter for closed fracture with routine healing: Secondary | ICD-10-CM | POA: Diagnosis not present

## 2022-04-04 MED ORDER — AMOXICILLIN 500 MG PO TABS: ORAL_TABLET | ORAL | 0 refills | Status: DC

## 2022-04-04 NOTE — Telephone Encounter (Signed)
Patient aware this was sent to pharmacy

## 2022-04-04 NOTE — Telephone Encounter (Signed)
Patient states he is having a tooth repaired and his dentist told him he needs to have an antibiotic before the procedure. Please advise..[phone], patient states he would like it called into  [facility] [phone].

## 2022-04-06 DIAGNOSIS — I2510 Atherosclerotic heart disease of native coronary artery without angina pectoris: Secondary | ICD-10-CM | POA: Diagnosis not present

## 2022-04-06 DIAGNOSIS — S72062D Displaced articular fracture of head of left femur, subsequent encounter for closed fracture with routine healing: Secondary | ICD-10-CM | POA: Diagnosis not present

## 2022-04-06 DIAGNOSIS — I4510 Unspecified right bundle-branch block: Secondary | ICD-10-CM | POA: Diagnosis not present

## 2022-04-06 DIAGNOSIS — E119 Type 2 diabetes mellitus without complications: Secondary | ICD-10-CM | POA: Diagnosis not present

## 2022-04-06 DIAGNOSIS — I10 Essential (primary) hypertension: Secondary | ICD-10-CM | POA: Diagnosis not present

## 2022-04-06 DIAGNOSIS — E785 Hyperlipidemia, unspecified: Secondary | ICD-10-CM | POA: Diagnosis not present

## 2022-04-07 ENCOUNTER — Encounter: Payer: Self-pay | Admitting: Physical Medicine and Rehabilitation

## 2022-04-07 ENCOUNTER — Encounter
Payer: Medicare Other | Attending: Physical Medicine and Rehabilitation | Admitting: Physical Medicine and Rehabilitation

## 2022-04-07 VITALS — BP 148/84 | HR 78 | Ht 72.0 in | Wt 190.0 lb

## 2022-04-07 DIAGNOSIS — E1165 Type 2 diabetes mellitus with hyperglycemia: Secondary | ICD-10-CM | POA: Insufficient documentation

## 2022-04-07 DIAGNOSIS — S72353S Displaced comminuted fracture of shaft of unspecified femur, sequela: Secondary | ICD-10-CM | POA: Insufficient documentation

## 2022-04-07 MED ORDER — METOPROLOL SUCCINATE ER 50 MG PO TB24: 50.0000 mg | ORAL_TABLET | Freq: Every evening | ORAL | 2 refills | Status: DC

## 2022-04-07 MED ORDER — SYNJARDY XR 25-1000 MG PO TB24: 1.0000 | ORAL_TABLET | Freq: Every day | ORAL | 3 refills | Status: DC

## 2022-04-07 MED ORDER — RAMIPRIL 5 MG PO CAPS: 5.0000 mg | ORAL_CAPSULE | Freq: Two times a day (BID) | ORAL | 2 refills | Status: DC

## 2022-04-07 MED ORDER — ATORVASTATIN CALCIUM 80 MG PO TABS: 80.0000 mg | ORAL_TABLET | Freq: Every day | ORAL | 3 refills | Status: DC

## 2022-04-07 MED ORDER — METOPROLOL SUCCINATE ER 50 MG PO TB24: 50.0000 mg | ORAL_TABLET | Freq: Every evening | ORAL | 2 refills | Status: AC

## 2022-04-07 MED ORDER — PIOGLITAZONE HCL 15 MG PO TABS: 15.0000 mg | ORAL_TABLET | Freq: Every day | ORAL | 3 refills | Status: DC

## 2022-04-07 MED ORDER — CARBIDOPA-LEVODOPA 25-100 MG PO TABS: 1.0000 | ORAL_TABLET | Freq: Two times a day (BID) | ORAL | 6 refills | Status: DC

## 2022-04-07 MED ORDER — CLOPIDOGREL BISULFATE 75 MG PO TABS: 75.0000 mg | ORAL_TABLET | Freq: Every day | ORAL | 0 refills | Status: DC

## 2022-04-07 MED ORDER — VITAMIN D (ERGOCALCIFEROL) 1.25 MG (50000 UNIT) PO CAPS: 50000.0000 [IU] | ORAL_CAPSULE | ORAL | 0 refills | Status: DC

## 2022-04-07 NOTE — Progress Notes (Signed)
Subjective:    Patient ID: Joel Mech., male    DOB: Dec 03, 1945, 77 y.o.   MRN: [id number]  HPI Joel Gonzales is a 77 year old man  No concerns at home PT has been coming by and wife says she is super He has been sleeping well.  BP 148/84.  Wife felt that diet improved in the hospital.  Done really well at home.  Denies pain  Pain Inventory Average Pain 0 Pain Right Now 0 My pain is  no pain  In the last 24 hours, has pain interfered with the following? General activity 0 Relation with others 0 Enjoyment of life 0 What TIME of day is your pain at its worst? varies Sleep (in general) Good  Pain is worse with:  none Pain improves with:  none Relief from Meds:  no pain  use a cane ability to climb steps?  yes do you drive?  yes Do you have any goals in this area?  yes  employed # of hrs/week 20-40  trouble walking  Any changes since last visit?  no  Any changes since last visit?  no    Family History  Problem Relation Age of Onset   Heart attack Father 31       deceased, ASCVD   Hypertension Father    Heart disease Father    Heart Problems Mother        ASCVD   Hypertension Mother    Heart attack Mother    Heart attack Sister    Hypertension Brother    Hydrocephalus Brother    Social History   Socioeconomic History   Marital status: Married    Spouse name: Not on file   Number of children: 1   Years of education: Not on file   Highest education level: Bachelor's degree (e.g., BA, AB, BS)  Occupational History    Comment: [employer]  Tobacco Use   Smoking status: Never   Smokeless tobacco: Never  Vaping Use   Vaping Use: Never used  Substance and Sexual Activity   Alcohol use: Yes    Comment: minor   Drug use: No   Sexual activity: Not on file  Other Topics Concern   Not on file  Social History Narrative   Lives at home with his wife   Right handed   Drinks at least 1 cup daily of caffeine   Social Determinants of Health    Financial Resource Strain: Not on file  Food Insecurity: No Food Insecurity (03/03/2022)   Hunger Vital Sign    Worried About Running Out of Food in the Last Year: Never true    Ran Out of Food in the Last Year: Never true  Transportation Needs: No Transportation Needs (03/03/2022)   PRAPARE - Administrator, Civil Service (Medical): No    Lack of Transportation (Non-Medical): No  Physical Activity: Not on file  Stress: Not on file  Social Connections: Not on file   Past Surgical History:  Procedure Laterality Date   CHOLECYSTECTOMY  2002   CORONARY ARTERY BYPASS GRAFT     x6 LIma to his LAD, a RIMA to his PDA, right radial to obtuse marginal branch, diagnonal, second marginal, & posteolateral branches   NOSE SURGERY  02/2013   carcinoma   TONSILLECTOMY     as a child   TOTAL HIP ARTHROPLASTY Right 02/18/2021   Procedure: TOTAL HIP ARTHROPLASTY ANTERIOR APPROACH;  Surgeon: Kathryne Hitch, MD;  Location: [facility] OR;  Service: Orthopedics;  Laterality: Right;   TOTAL HIP ARTHROPLASTY Left 03/03/2022   Procedure: TOTAL HIP ARTHROPLASTY ANTERIOR APPROACH;  Surgeon: Kathryne Hitch, MD;  Location: [facility] ORS;  Service: Orthopedics;  Laterality: Left;   Past Medical History:  Diagnosis Date   CAD (coronary artery disease)    Diabetes mellitus without complication (HCC)    Type 2   Family history of ASCVD    History of stress test 01/2012   which was entirely normal   Hx of CABG    x6 LIma to his LAD, a RIMA to his PDA, right radial to obtuse marginal branch, diagnonal, second marginal, & posteolateral branches   Hyperlipemia    Hypertension    RBBB    chronic   Ht 6' (1.829 m)   Wt 190 lb (86.2 kg)   BMI 25.77 kg/m   Opioid Risk Score:   Fall Risk Score:  `1  Depression screen PHQ 2/9      No data to display            Review of Systems  All other systems reviewed and are negative.     Objective:   Physical Exam  Gen: no distress,  normal appearing HEENT: oral mucosa pink and moist, NCAT Cardio: Reg rate Chest: normal effort, normal rate of breathing Abd: soft, non-distended Ext: no edema Psych: pleasant, normal affect Skin: intact Neuro: Alert oriented x3    Assessment & Plan:  1) Hip fracture -discussed pain  -continue therapy -Provided with a pain relief journal and discussed that it contains foods and lifestyle tips to naturally help to improve pain. Discussed that these lifestyle strategies are also very good for health unlike some medications which can have negative side effects. Discussed that the act of keeping a journal can be therapeutic and helpful to realize patterns what helps to trigger and alleviate pain.    2) Diabetes: -Synjardy refilled -check CBGs daily, log, and bring log to follow-up appointment -avoid sugar, bread, pasta, rice -avoid snacking -perform daily foot exam and at least annual eye exam -try to incorporate into your diet some of the following foods which are good for diabetes: 1) cinnamon- imitates effects of insulin, increasing glucose transport into cells ([place] or [place] cinnamon is best, least processed) 2) nuts- can slow down the blood sugar response of carbohydrate rich foods 3) oatmeal- contains and anti-inflammatory compound avenanthramide 4) whole-milk yogurt (best types are no sugar, [place] yogurt, or goat/sheep yogurt) 5) beans- high in protein, fiber, and vitamins, low glycemic index 6) broccoli- great source of vitamin A and C 7) quinoa- higher in protein and fiber than other grains 8) spinach- high in vitamin A, fiber, and protein 9) olive oil- reduces glucose levels, LDL, and triglycerides 10) salmon- excellent amount of omega-3-fatty acids 11) walnuts- rich in antioxidants 12) apples- high in fiber and quercetin 13) carrots- highly nutritious with low impact on blood sugar 14) eggs- improve HDL (good cholesterol), high in protein, keep you satiated 15)  turmeric: improves blood sugars, cardiovascular disease, and protects kidney health 16) garlic: improves blood sugar, blood pressure, pain 17) tomatoes: highly nutritious with low impact on blood sugar

## 2022-04-15 DIAGNOSIS — I10 Essential (primary) hypertension: Secondary | ICD-10-CM | POA: Diagnosis not present

## 2022-04-15 DIAGNOSIS — S72062D Displaced articular fracture of head of left femur, subsequent encounter for closed fracture with routine healing: Secondary | ICD-10-CM | POA: Diagnosis not present

## 2022-04-15 DIAGNOSIS — E119 Type 2 diabetes mellitus without complications: Secondary | ICD-10-CM | POA: Diagnosis not present

## 2022-04-15 DIAGNOSIS — Z9181 History of falling: Secondary | ICD-10-CM | POA: Diagnosis not present

## 2022-04-15 DIAGNOSIS — Z7984 Long term (current) use of oral hypoglycemic drugs: Secondary | ICD-10-CM | POA: Diagnosis not present

## 2022-04-15 DIAGNOSIS — I4510 Unspecified right bundle-branch block: Secondary | ICD-10-CM | POA: Diagnosis not present

## 2022-04-15 DIAGNOSIS — G214 Vascular parkinsonism: Secondary | ICD-10-CM | POA: Diagnosis not present

## 2022-04-15 DIAGNOSIS — Z951 Presence of aortocoronary bypass graft: Secondary | ICD-10-CM | POA: Diagnosis not present

## 2022-04-15 DIAGNOSIS — I2510 Atherosclerotic heart disease of native coronary artery without angina pectoris: Secondary | ICD-10-CM | POA: Diagnosis not present

## 2022-04-15 DIAGNOSIS — Z96643 Presence of artificial hip joint, bilateral: Secondary | ICD-10-CM | POA: Diagnosis not present

## 2022-04-15 DIAGNOSIS — E785 Hyperlipidemia, unspecified: Secondary | ICD-10-CM | POA: Diagnosis not present

## 2022-04-15 DIAGNOSIS — Z7901 Long term (current) use of anticoagulants: Secondary | ICD-10-CM | POA: Diagnosis not present

## 2022-04-17 ENCOUNTER — Ambulatory Visit (INDEPENDENT_AMBULATORY_CARE_PROVIDER_SITE_OTHER): Payer: Medicare Other | Admitting: Orthopaedic Surgery

## 2022-04-17 ENCOUNTER — Encounter: Payer: Self-pay | Admitting: Orthopaedic Surgery

## 2022-04-17 DIAGNOSIS — Z96642 Presence of left artificial hip joint: Secondary | ICD-10-CM

## 2022-04-17 NOTE — Progress Notes (Signed)
The patient is now 6 weeks status post a left total hip arthroplasty to treat a femoral neck fracture.  We actually replaced his right hip in 2022.  He is ambulate the cane.  He is 77 years old.  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  He has been having home health therapy which has helped and he would like to extend this.  He is ambulate with a cane.  His incision looks good on his more recent left operative hip.  Both hips move smoothly.  He reports minimal pain.  Will see him back in 4 weeks after a course of continued home health therapy.  At that visit we will have a standing low AP pelvis.

## 2022-04-25 DIAGNOSIS — I2510 Atherosclerotic heart disease of native coronary artery without angina pectoris: Secondary | ICD-10-CM | POA: Diagnosis not present

## 2022-04-25 DIAGNOSIS — E119 Type 2 diabetes mellitus without complications: Secondary | ICD-10-CM | POA: Diagnosis not present

## 2022-04-25 DIAGNOSIS — I10 Essential (primary) hypertension: Secondary | ICD-10-CM | POA: Diagnosis not present

## 2022-04-25 DIAGNOSIS — S72062D Displaced articular fracture of head of left femur, subsequent encounter for closed fracture with routine healing: Secondary | ICD-10-CM | POA: Diagnosis not present

## 2022-04-25 DIAGNOSIS — E785 Hyperlipidemia, unspecified: Secondary | ICD-10-CM | POA: Diagnosis not present

## 2022-04-25 DIAGNOSIS — I4510 Unspecified right bundle-branch block: Secondary | ICD-10-CM | POA: Diagnosis not present

## 2022-04-26 DIAGNOSIS — I4510 Unspecified right bundle-branch block: Secondary | ICD-10-CM | POA: Diagnosis not present

## 2022-04-26 DIAGNOSIS — I10 Essential (primary) hypertension: Secondary | ICD-10-CM | POA: Diagnosis not present

## 2022-04-26 DIAGNOSIS — S72062D Displaced articular fracture of head of left femur, subsequent encounter for closed fracture with routine healing: Secondary | ICD-10-CM | POA: Diagnosis not present

## 2022-04-26 DIAGNOSIS — E785 Hyperlipidemia, unspecified: Secondary | ICD-10-CM | POA: Diagnosis not present

## 2022-04-26 DIAGNOSIS — E119 Type 2 diabetes mellitus without complications: Secondary | ICD-10-CM | POA: Diagnosis not present

## 2022-04-26 DIAGNOSIS — I2510 Atherosclerotic heart disease of native coronary artery without angina pectoris: Secondary | ICD-10-CM | POA: Diagnosis not present

## 2022-05-01 ENCOUNTER — Other Ambulatory Visit: Payer: Self-pay

## 2022-05-01 ENCOUNTER — Telehealth: Payer: Self-pay | Admitting: Orthopaedic Surgery

## 2022-05-01 DIAGNOSIS — Z96642 Presence of left artificial hip joint: Secondary | ICD-10-CM

## 2022-05-01 DIAGNOSIS — E119 Type 2 diabetes mellitus without complications: Secondary | ICD-10-CM | POA: Diagnosis not present

## 2022-05-01 DIAGNOSIS — E785 Hyperlipidemia, unspecified: Secondary | ICD-10-CM | POA: Diagnosis not present

## 2022-05-01 DIAGNOSIS — S72062D Displaced articular fracture of head of left femur, subsequent encounter for closed fracture with routine healing: Secondary | ICD-10-CM | POA: Diagnosis not present

## 2022-05-01 DIAGNOSIS — I4510 Unspecified right bundle-branch block: Secondary | ICD-10-CM | POA: Diagnosis not present

## 2022-05-01 DIAGNOSIS — I2510 Atherosclerotic heart disease of native coronary artery without angina pectoris: Secondary | ICD-10-CM | POA: Diagnosis not present

## 2022-05-01 DIAGNOSIS — I10 Essential (primary) hypertension: Secondary | ICD-10-CM | POA: Diagnosis not present

## 2022-05-01 NOTE — Telephone Encounter (Signed)
Referral sent 

## 2022-05-01 NOTE — Telephone Encounter (Signed)
Patient called in stating his home health nurse said he would benefit from a referral to outpatient rehab at [facility] being his home health is ending soon, please advise

## 2022-05-10 ENCOUNTER — Ambulatory Visit: Payer: Medicare Other | Attending: Cardiovascular Disease | Admitting: Cardiovascular Disease

## 2022-05-10 ENCOUNTER — Encounter: Payer: Self-pay | Admitting: Cardiovascular Disease

## 2022-05-10 VITALS — BP 132/80 | HR 53 | Ht 72.0 in | Wt 187.0 lb

## 2022-05-10 DIAGNOSIS — E785 Hyperlipidemia, unspecified: Secondary | ICD-10-CM | POA: Diagnosis not present

## 2022-05-10 DIAGNOSIS — I10 Essential (primary) hypertension: Secondary | ICD-10-CM | POA: Diagnosis not present

## 2022-05-10 DIAGNOSIS — I4510 Unspecified right bundle-branch block: Secondary | ICD-10-CM | POA: Insufficient documentation

## 2022-05-10 DIAGNOSIS — Z951 Presence of aortocoronary bypass graft: Secondary | ICD-10-CM | POA: Insufficient documentation

## 2022-05-10 NOTE — Patient Instructions (Signed)
Medication Instructions:  Your physician recommends that you continue on your current medications as directed. Please refer to the Current Medication list given to you today.  *If you need a refill on your cardiac medications before your next appointment, please call your pharmacy*   Follow-Up: At [facility], you and your health needs are our priority.  As part of our continuing mission to provide you with exceptional heart care, we have created designated Provider Care Teams.  These Care Teams include your primary Cardiologist (physician) and Advanced Practice Providers (APPs -  Physician Assistants and Nurse Practitioners) who all work together to provide you with the care you need, when you need it.  We recommend signing up for the patient portal called "MyChart".  Sign up information is provided on this After Visit Summary.  MyChart is used to connect with patients for Virtual Visits (Telemedicine).  Patients are able to view lab/test results, encounter notes, upcoming appointments, etc.  Non-urgent messages can be sent to your provider as well.   To learn more about what you can do with MyChart, go to [email or web address].    Your next appointment:   12 month(s)  Provider:   Quay Burow, MD

## 2022-05-10 NOTE — Progress Notes (Signed)
05/10/2022 [name]   [birth year]  [id number]  Primary Physician Tisovec, Fransico Him, MD Primary Cardiologist: Lorretta Harp MD FACP, [place], [place], [place]  HPI:  Joel Gonzales. is a 77 y.o.   mildly overweight Caucasian male, father of 1 child, who I last saw in the office 05/03/2021.  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  He is accompanied by his wife Joel Gonzales today.  He has a history of CAD status post coronary artery bypass surgery x6 in February 2001. He had a LIMA to his LAD, a RIMA to his PDA, right radial to the first obtuse marginal branch, diagonal branch, second marginal branch, and posterolateral branches. His other problems include treated hypertension, dyslipidemia, and chronic right bundle branch block. He denies chest pain or shortness of breath. Dr. Nicki Reaper has checked his lipid profile in the past. He had a Myoview stress test performed October 31, which was entirely normal. Since I saw him year ago he's been completely asymptomatic. He had a Myoview stress test performed in the office 05/15/14 which is entirely normal as was a 2-D echocardiogram.   He was admitted to the hospital with stroke type symptoms 03/04/17 and was seen by neurology. An MRI that showed a 4 mm punctate focus involving the subcortical white matter of the left parietal lobe. A 2-D echo was normal. He is currently wearing an event monitor. He complains of being unsteady on his feet with a shuffling gait and saw a gait disorder neurologist at [facility] .   Since I saw Joel Gonzales a year ago he continues to do well from a cardiology perspective.  He denies chest pain or shortness of breath.  He did fall down and broke several ribs back in September and broke his hip in November of 2022 with his hip late last year which was operatively addressed by Dr. Ninfa Linden.  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 Lavell Anchors, his neurologist.   Current  Meds  Medication Sig   atorvastatin (LIPITOR) 80 MG tablet Take 1 tablet (80 mg total) by mouth at bedtime.   carbidopa-levodopa (SINEMET IR) 25-100 MG tablet Take 1 tablet by mouth 2 (two) times daily. Take on an empty stomach or with crackers if makes nauseated, If have side effects try 1/2 tablet twice daily.   clopidogrel (PLAVIX) 75 MG tablet Take 1 tablet (75 mg total) by mouth daily.   Empagliflozin-metFORMIN HCl ER (SYNJARDY XR) 25-1000 MG TB24 Take 1 tablet by mouth daily.   metoprolol succinate (TOPROL-XL) 50 MG 24 hr tablet Take 1 tablet (50 mg total) by mouth at bedtime.   pantoprazole (PROTONIX) 40 MG tablet Take 40 mg by mouth daily.   pioglitazone (ACTOS) 15 MG tablet Take 1 tablet (15 mg total) by mouth daily.   ramipril (ALTACE) 5 MG capsule Take 1 capsule (5 mg total) by mouth 2 (two) times daily.   senna-docusate (SENOKOT-S) 8.6-50 MG tablet Take 2 tablets by mouth daily at 6 (six) AM.   Vitamin D, Ergocalciferol, (DRISDOL) 1.25 MG (50000 UNIT) CAPS capsule Take 1 capsule (50,000 Units total) by mouth every 7 (seven) days.   [DISCONTINUED] amoxicillin (AMOXIL) 500 MG tablet Take 2 tabs by mouth an hour before dental appointment then 2 tabs six hours after     No Known Allergies  Social History   Socioeconomic History   Marital status: Married    Spouse name: Not  on file   Number of children: 1   Years of education: Not on file   Highest education level: Bachelor's degree (e.g., BA, AB, BS)  Occupational History    Comment: [employer]  Tobacco Use   Smoking status: Never   Smokeless tobacco: Never  Vaping Use   Vaping Use: Never used  Substance and Sexual Activity   Alcohol use: Yes    Comment: minor   Drug use: No   Sexual activity: Not on file  Other Topics Concern   Not on file  Social History Narrative   Lives at home with his wife   Right handed   Drinks at least 1 cup daily of caffeine   Social Determinants of Health   Financial Resource  Strain: Not on file  Food Insecurity: No Food Insecurity (03/03/2022)   Hunger Vital Sign    Worried About Running Out of Food in the Last Year: Never true    Ran Out of Food in the Last Year: Never true  Transportation Needs: No Transportation Needs (03/03/2022)   PRAPARE - Hydrologist (Medical): No    Lack of Transportation (Non-Medical): No  Physical Activity: Not on file  Stress: Not on file  Social Connections: Not on file  Intimate Partner Violence: Not At Risk (03/03/2022)   Humiliation, Afraid, Rape, and Kick questionnaire    Fear of Current or Ex-Partner: No    Emotionally Abused: No    Physically Abused: No    Sexually Abused: No     Review of Systems: General: negative for chills, fever, night sweats or weight changes.  Cardiovascular: negative for chest pain, dyspnea on exertion, edema, orthopnea, palpitations, paroxysmal nocturnal dyspnea or shortness of breath Dermatological: negative for rash Respiratory: negative for cough or wheezing Urologic: negative for hematuria Abdominal: negative for nausea, vomiting, diarrhea, bright red blood per rectum, melena, or hematemesis Neurologic: negative for visual changes, syncope, or dizziness All other systems reviewed and are otherwise negative except as noted above.    Blood pressure 132/80, pulse (!) 53, height 6' (1.829 m), weight 187 lb (84.8 kg), SpO2 98 %.  General appearance: alert and no distress Neck: no adenopathy, no carotid bruit, no JVD, supple, symmetrical, trachea midline, and thyroid not enlarged, symmetric, no tenderness/mass/nodules Lungs: clear to auscultation bilaterally Heart: regular rate and rhythm, S1, S2 normal, no murmur, click, rub or gallop Extremities: extremities normal, atraumatic, no cyanosis or edema Pulses: 2+ and symmetric Skin: Skin color, texture, turgor normal. No rashes or lesions Neurologic: Grossly normal  EKG sinus bradycardia at 53 with right bundle  branch block.  Personally reviewed this EKG.  ASSESSMENT AND PLAN:   Essential hypertension History of essential hypertension a blood pressure measured today at 132/80.  He is on metoprolol and ramipril.  Hyperlipidemia with target LDL less than 130 History of hyperlipidemia on high-dose statin therapy with lipid profile performed 02/07/2022 revealing total cholesterol 137, LDL 74 and HDL 44.  Hx of CABG History of coronary artery disease status post bypass grafting times 10 May 1999.  He had a LIMA to his LAD, RIMA to the PDA, right radial to the first obtuse marginal branch, diagonal branch and posterolateral branches.  He had a Myoview performed 05/15/2014 which was entirely normal.  He denies chest pain or shortness of breath.  Right bundle branch block Chronic     Lorretta Harp MD [facility], [facility] 05/10/2022 9:47 AM

## 2022-05-10 NOTE — Assessment & Plan Note (Signed)
Chronic. 

## 2022-05-10 NOTE — Assessment & Plan Note (Signed)
History of essential hypertension a blood pressure measured today at 132/80.  He is on metoprolol and ramipril.

## 2022-05-10 NOTE — Assessment & Plan Note (Signed)
History of hyperlipidemia on high-dose statin therapy with lipid profile performed 02/07/2022 revealing total cholesterol 137, LDL 74 and HDL 44.

## 2022-05-10 NOTE — Assessment & Plan Note (Signed)
History of coronary artery disease status post bypass grafting times 10 May 1999.  He had a LIMA to his LAD, RIMA to the PDA, right radial to the first obtuse marginal branch, diagonal branch and posterolateral branches.  He had a Myoview performed 05/15/2014 which was entirely normal.  He denies chest pain or shortness of breath.

## 2022-05-11 ENCOUNTER — Telehealth: Payer: Self-pay | Admitting: Neurology

## 2022-05-11 DIAGNOSIS — I4510 Unspecified right bundle-branch block: Secondary | ICD-10-CM | POA: Diagnosis not present

## 2022-05-11 DIAGNOSIS — E119 Type 2 diabetes mellitus without complications: Secondary | ICD-10-CM | POA: Diagnosis not present

## 2022-05-11 DIAGNOSIS — G20C Parkinsonism, unspecified: Secondary | ICD-10-CM

## 2022-05-11 DIAGNOSIS — S72062D Displaced articular fracture of head of left femur, subsequent encounter for closed fracture with routine healing: Secondary | ICD-10-CM | POA: Diagnosis not present

## 2022-05-11 DIAGNOSIS — I10 Essential (primary) hypertension: Secondary | ICD-10-CM | POA: Diagnosis not present

## 2022-05-11 DIAGNOSIS — E785 Hyperlipidemia, unspecified: Secondary | ICD-10-CM | POA: Diagnosis not present

## 2022-05-11 DIAGNOSIS — I2510 Atherosclerotic heart disease of native coronary artery without angina pectoris: Secondary | ICD-10-CM | POA: Diagnosis not present

## 2022-05-11 DIAGNOSIS — R269 Unspecified abnormalities of gait and mobility: Secondary | ICD-10-CM

## 2022-05-11 DIAGNOSIS — R482 Apraxia: Secondary | ICD-10-CM

## 2022-05-11 NOTE — Telephone Encounter (Signed)
Pt states he just completed pt, he is having balance issues, and is asking if Dr Jaynee Eagles will again have him set up for the balance program thru [facility], please call.

## 2022-05-11 NOTE — Telephone Encounter (Signed)
Yes please place order.

## 2022-05-11 NOTE — Telephone Encounter (Signed)
Dr Jaynee Eagles placed this referral back in 2019. Dr Jaynee Eagles, are you ok with referring to PT at [facility] again?

## 2022-05-15 ENCOUNTER — Encounter: Payer: Self-pay | Admitting: Orthopaedic Surgery

## 2022-05-15 ENCOUNTER — Ambulatory Visit (INDEPENDENT_AMBULATORY_CARE_PROVIDER_SITE_OTHER): Payer: Medicare Other

## 2022-05-15 ENCOUNTER — Ambulatory Visit (INDEPENDENT_AMBULATORY_CARE_PROVIDER_SITE_OTHER): Payer: Medicare Other | Admitting: Orthopaedic Surgery

## 2022-05-15 DIAGNOSIS — Z96642 Presence of left artificial hip joint: Secondary | ICD-10-CM | POA: Diagnosis not present

## 2022-05-15 NOTE — Addendum Note (Signed)
Addended by: Gildardo Griffes on: 05/15/2022 07:56 AM   Modules accepted: Orders

## 2022-05-15 NOTE — Progress Notes (Signed)
Joel Gonzales comes in today at over 2 months status post a left total hip arthroplasty to treat a displaced femoral neck fracture.  We replaced his right hip a year before that due to the same reason.  He is 76.  He has parkinsonian's type of trait.  He still ambulate with a cane.  [facility] does [facility] [place] [facility] cardiac rehab so I gave him a note stating that he can work on any rehab machines and equipment for strengthening his lower extremities with no restrictions for his hips at this point.  He reports he is doing well.  Both his hips are slightly stiff but have good motion otherwise.  An AP pelvis shows well-seated bilateral total hip arthroplasties.  He will continue to work on his balance and coordination and gait.  From an orthopedic standpoint, we will see him back in 6 months for final AP pelvis.

## 2022-05-15 NOTE — Telephone Encounter (Signed)
Order placed for PT at [facility] per v.o. Dr Jaynee Eagles.

## 2022-05-15 NOTE — Telephone Encounter (Signed)
Referral sent to [facility], phone # [phone].

## 2022-05-29 DIAGNOSIS — D485 Neoplasm of uncertain behavior of skin: Secondary | ICD-10-CM | POA: Diagnosis not present

## 2022-05-29 DIAGNOSIS — Z85828 Personal history of other malignant neoplasm of skin: Secondary | ICD-10-CM | POA: Diagnosis not present

## 2022-05-29 DIAGNOSIS — L570 Actinic keratosis: Secondary | ICD-10-CM | POA: Diagnosis not present

## 2022-05-29 DIAGNOSIS — L821 Other seborrheic keratosis: Secondary | ICD-10-CM | POA: Diagnosis not present

## 2022-05-29 DIAGNOSIS — D3617 Benign neoplasm of peripheral nerves and autonomic nervous system of trunk, unspecified: Secondary | ICD-10-CM | POA: Diagnosis not present

## 2022-05-29 DIAGNOSIS — D3612 Benign neoplasm of peripheral nerves and autonomic nervous system, upper limb, including shoulder: Secondary | ICD-10-CM | POA: Diagnosis not present

## 2022-05-29 DIAGNOSIS — D1801 Hemangioma of skin and subcutaneous tissue: Secondary | ICD-10-CM | POA: Diagnosis not present

## 2022-06-08 ENCOUNTER — Encounter: Payer: Self-pay | Admitting: Radiology

## 2022-06-14 DIAGNOSIS — M6281 Muscle weakness (generalized): Secondary | ICD-10-CM | POA: Diagnosis not present

## 2022-06-14 DIAGNOSIS — R278 Other lack of coordination: Secondary | ICD-10-CM | POA: Diagnosis not present

## 2022-06-14 DIAGNOSIS — G20C Parkinsonism, unspecified: Secondary | ICD-10-CM | POA: Diagnosis not present

## 2022-06-14 DIAGNOSIS — R482 Apraxia: Secondary | ICD-10-CM | POA: Diagnosis not present

## 2022-06-14 DIAGNOSIS — R2681 Unsteadiness on feet: Secondary | ICD-10-CM | POA: Diagnosis not present

## 2022-06-14 DIAGNOSIS — R269 Unspecified abnormalities of gait and mobility: Secondary | ICD-10-CM | POA: Diagnosis not present

## 2022-06-14 DIAGNOSIS — Z96642 Presence of left artificial hip joint: Secondary | ICD-10-CM | POA: Diagnosis not present

## 2022-06-20 DIAGNOSIS — M6281 Muscle weakness (generalized): Secondary | ICD-10-CM | POA: Diagnosis not present

## 2022-06-20 DIAGNOSIS — R2681 Unsteadiness on feet: Secondary | ICD-10-CM | POA: Diagnosis not present

## 2022-06-20 DIAGNOSIS — R269 Unspecified abnormalities of gait and mobility: Secondary | ICD-10-CM | POA: Diagnosis not present

## 2022-06-20 DIAGNOSIS — R482 Apraxia: Secondary | ICD-10-CM | POA: Diagnosis not present

## 2022-06-20 DIAGNOSIS — Z96642 Presence of left artificial hip joint: Secondary | ICD-10-CM | POA: Diagnosis not present

## 2022-06-20 DIAGNOSIS — G20C Parkinsonism, unspecified: Secondary | ICD-10-CM | POA: Diagnosis not present

## 2022-06-23 DIAGNOSIS — R2681 Unsteadiness on feet: Secondary | ICD-10-CM | POA: Diagnosis not present

## 2022-06-23 DIAGNOSIS — G20C Parkinsonism, unspecified: Secondary | ICD-10-CM | POA: Diagnosis not present

## 2022-06-23 DIAGNOSIS — R269 Unspecified abnormalities of gait and mobility: Secondary | ICD-10-CM | POA: Diagnosis not present

## 2022-06-23 DIAGNOSIS — R482 Apraxia: Secondary | ICD-10-CM | POA: Diagnosis not present

## 2022-06-23 DIAGNOSIS — M6281 Muscle weakness (generalized): Secondary | ICD-10-CM | POA: Diagnosis not present

## 2022-06-23 DIAGNOSIS — Z96642 Presence of left artificial hip joint: Secondary | ICD-10-CM | POA: Diagnosis not present

## 2022-06-26 DIAGNOSIS — R482 Apraxia: Secondary | ICD-10-CM | POA: Diagnosis not present

## 2022-06-26 DIAGNOSIS — R269 Unspecified abnormalities of gait and mobility: Secondary | ICD-10-CM | POA: Diagnosis not present

## 2022-06-26 DIAGNOSIS — M6281 Muscle weakness (generalized): Secondary | ICD-10-CM | POA: Diagnosis not present

## 2022-06-26 DIAGNOSIS — Z96642 Presence of left artificial hip joint: Secondary | ICD-10-CM | POA: Diagnosis not present

## 2022-06-26 DIAGNOSIS — G20C Parkinsonism, unspecified: Secondary | ICD-10-CM | POA: Diagnosis not present

## 2022-06-26 DIAGNOSIS — R2681 Unsteadiness on feet: Secondary | ICD-10-CM | POA: Diagnosis not present

## 2022-07-04 DIAGNOSIS — M6281 Muscle weakness (generalized): Secondary | ICD-10-CM | POA: Diagnosis not present

## 2022-07-04 DIAGNOSIS — R2681 Unsteadiness on feet: Secondary | ICD-10-CM | POA: Diagnosis not present

## 2022-07-04 DIAGNOSIS — G20C Parkinsonism, unspecified: Secondary | ICD-10-CM | POA: Diagnosis not present

## 2022-07-04 DIAGNOSIS — Z96642 Presence of left artificial hip joint: Secondary | ICD-10-CM | POA: Diagnosis not present

## 2022-07-04 DIAGNOSIS — R269 Unspecified abnormalities of gait and mobility: Secondary | ICD-10-CM | POA: Diagnosis not present

## 2022-07-04 DIAGNOSIS — R482 Apraxia: Secondary | ICD-10-CM | POA: Diagnosis not present

## 2022-07-04 DIAGNOSIS — R278 Other lack of coordination: Secondary | ICD-10-CM | POA: Diagnosis not present

## 2022-07-06 DIAGNOSIS — R482 Apraxia: Secondary | ICD-10-CM | POA: Diagnosis not present

## 2022-07-06 DIAGNOSIS — M6281 Muscle weakness (generalized): Secondary | ICD-10-CM | POA: Diagnosis not present

## 2022-07-06 DIAGNOSIS — R269 Unspecified abnormalities of gait and mobility: Secondary | ICD-10-CM | POA: Diagnosis not present

## 2022-07-06 DIAGNOSIS — G20C Parkinsonism, unspecified: Secondary | ICD-10-CM | POA: Diagnosis not present

## 2022-07-06 DIAGNOSIS — R2681 Unsteadiness on feet: Secondary | ICD-10-CM | POA: Diagnosis not present

## 2022-07-06 DIAGNOSIS — Z96642 Presence of left artificial hip joint: Secondary | ICD-10-CM | POA: Diagnosis not present

## 2022-07-11 DIAGNOSIS — R2681 Unsteadiness on feet: Secondary | ICD-10-CM | POA: Diagnosis not present

## 2022-07-11 DIAGNOSIS — G20C Parkinsonism, unspecified: Secondary | ICD-10-CM | POA: Diagnosis not present

## 2022-07-11 DIAGNOSIS — R482 Apraxia: Secondary | ICD-10-CM | POA: Diagnosis not present

## 2022-07-11 DIAGNOSIS — M6281 Muscle weakness (generalized): Secondary | ICD-10-CM | POA: Diagnosis not present

## 2022-07-11 DIAGNOSIS — Z96642 Presence of left artificial hip joint: Secondary | ICD-10-CM | POA: Diagnosis not present

## 2022-07-11 DIAGNOSIS — R269 Unspecified abnormalities of gait and mobility: Secondary | ICD-10-CM | POA: Diagnosis not present

## 2022-07-13 DIAGNOSIS — R269 Unspecified abnormalities of gait and mobility: Secondary | ICD-10-CM | POA: Diagnosis not present

## 2022-07-13 DIAGNOSIS — R2681 Unsteadiness on feet: Secondary | ICD-10-CM | POA: Diagnosis not present

## 2022-07-13 DIAGNOSIS — Z96642 Presence of left artificial hip joint: Secondary | ICD-10-CM | POA: Diagnosis not present

## 2022-07-13 DIAGNOSIS — G20C Parkinsonism, unspecified: Secondary | ICD-10-CM | POA: Diagnosis not present

## 2022-07-13 DIAGNOSIS — M6281 Muscle weakness (generalized): Secondary | ICD-10-CM | POA: Diagnosis not present

## 2022-07-13 DIAGNOSIS — R482 Apraxia: Secondary | ICD-10-CM | POA: Diagnosis not present

## 2022-07-18 DIAGNOSIS — R2681 Unsteadiness on feet: Secondary | ICD-10-CM | POA: Diagnosis not present

## 2022-07-18 DIAGNOSIS — G20C Parkinsonism, unspecified: Secondary | ICD-10-CM | POA: Diagnosis not present

## 2022-07-18 DIAGNOSIS — R482 Apraxia: Secondary | ICD-10-CM | POA: Diagnosis not present

## 2022-07-18 DIAGNOSIS — Z96642 Presence of left artificial hip joint: Secondary | ICD-10-CM | POA: Diagnosis not present

## 2022-07-18 DIAGNOSIS — R269 Unspecified abnormalities of gait and mobility: Secondary | ICD-10-CM | POA: Diagnosis not present

## 2022-07-18 DIAGNOSIS — M6281 Muscle weakness (generalized): Secondary | ICD-10-CM | POA: Diagnosis not present

## 2022-07-20 DIAGNOSIS — Z96642 Presence of left artificial hip joint: Secondary | ICD-10-CM | POA: Diagnosis not present

## 2022-07-20 DIAGNOSIS — M6281 Muscle weakness (generalized): Secondary | ICD-10-CM | POA: Diagnosis not present

## 2022-07-20 DIAGNOSIS — G20C Parkinsonism, unspecified: Secondary | ICD-10-CM | POA: Diagnosis not present

## 2022-07-20 DIAGNOSIS — R2681 Unsteadiness on feet: Secondary | ICD-10-CM | POA: Diagnosis not present

## 2022-07-20 DIAGNOSIS — R482 Apraxia: Secondary | ICD-10-CM | POA: Diagnosis not present

## 2022-07-20 DIAGNOSIS — R269 Unspecified abnormalities of gait and mobility: Secondary | ICD-10-CM | POA: Diagnosis not present

## 2022-07-25 ENCOUNTER — Other Ambulatory Visit: Payer: Self-pay

## 2022-07-25 DIAGNOSIS — R269 Unspecified abnormalities of gait and mobility: Secondary | ICD-10-CM | POA: Diagnosis not present

## 2022-07-25 DIAGNOSIS — Z96642 Presence of left artificial hip joint: Secondary | ICD-10-CM | POA: Diagnosis not present

## 2022-07-25 DIAGNOSIS — G20C Parkinsonism, unspecified: Secondary | ICD-10-CM | POA: Diagnosis not present

## 2022-07-25 DIAGNOSIS — R2681 Unsteadiness on feet: Secondary | ICD-10-CM | POA: Diagnosis not present

## 2022-07-25 DIAGNOSIS — M6281 Muscle weakness (generalized): Secondary | ICD-10-CM | POA: Diagnosis not present

## 2022-07-25 DIAGNOSIS — R482 Apraxia: Secondary | ICD-10-CM | POA: Diagnosis not present

## 2022-07-26 MED ORDER — CLOPIDOGREL BISULFATE 75 MG PO TABS: 75.0000 mg | ORAL_TABLET | Freq: Every day | ORAL | 0 refills | Status: AC

## 2022-08-01 DIAGNOSIS — R269 Unspecified abnormalities of gait and mobility: Secondary | ICD-10-CM | POA: Diagnosis not present

## 2022-08-01 DIAGNOSIS — R482 Apraxia: Secondary | ICD-10-CM | POA: Diagnosis not present

## 2022-08-01 DIAGNOSIS — Z96642 Presence of left artificial hip joint: Secondary | ICD-10-CM | POA: Diagnosis not present

## 2022-08-01 DIAGNOSIS — G20C Parkinsonism, unspecified: Secondary | ICD-10-CM | POA: Diagnosis not present

## 2022-08-01 DIAGNOSIS — M6281 Muscle weakness (generalized): Secondary | ICD-10-CM | POA: Diagnosis not present

## 2022-08-01 DIAGNOSIS — R2681 Unsteadiness on feet: Secondary | ICD-10-CM | POA: Diagnosis not present

## 2022-08-03 DIAGNOSIS — R296 Repeated falls: Secondary | ICD-10-CM | POA: Diagnosis not present

## 2022-08-03 DIAGNOSIS — M6281 Muscle weakness (generalized): Secondary | ICD-10-CM | POA: Diagnosis not present

## 2022-08-03 DIAGNOSIS — R278 Other lack of coordination: Secondary | ICD-10-CM | POA: Diagnosis not present

## 2022-08-03 DIAGNOSIS — G20C Parkinsonism, unspecified: Secondary | ICD-10-CM | POA: Diagnosis not present

## 2022-08-03 DIAGNOSIS — Z96642 Presence of left artificial hip joint: Secondary | ICD-10-CM | POA: Diagnosis not present

## 2022-08-07 DIAGNOSIS — R278 Other lack of coordination: Secondary | ICD-10-CM | POA: Diagnosis not present

## 2022-08-07 DIAGNOSIS — G20C Parkinsonism, unspecified: Secondary | ICD-10-CM | POA: Diagnosis not present

## 2022-08-07 DIAGNOSIS — R296 Repeated falls: Secondary | ICD-10-CM | POA: Diagnosis not present

## 2022-08-07 DIAGNOSIS — M6281 Muscle weakness (generalized): Secondary | ICD-10-CM | POA: Diagnosis not present

## 2022-08-07 DIAGNOSIS — Z96642 Presence of left artificial hip joint: Secondary | ICD-10-CM | POA: Diagnosis not present

## 2022-08-09 DIAGNOSIS — R296 Repeated falls: Secondary | ICD-10-CM | POA: Diagnosis not present

## 2022-08-09 DIAGNOSIS — Z96642 Presence of left artificial hip joint: Secondary | ICD-10-CM | POA: Diagnosis not present

## 2022-08-09 DIAGNOSIS — M6281 Muscle weakness (generalized): Secondary | ICD-10-CM | POA: Diagnosis not present

## 2022-08-09 DIAGNOSIS — G20C Parkinsonism, unspecified: Secondary | ICD-10-CM | POA: Diagnosis not present

## 2022-08-09 DIAGNOSIS — R278 Other lack of coordination: Secondary | ICD-10-CM | POA: Diagnosis not present

## 2022-08-16 DIAGNOSIS — G20C Parkinsonism, unspecified: Secondary | ICD-10-CM | POA: Diagnosis not present

## 2022-08-16 DIAGNOSIS — M6281 Muscle weakness (generalized): Secondary | ICD-10-CM | POA: Diagnosis not present

## 2022-08-16 DIAGNOSIS — R278 Other lack of coordination: Secondary | ICD-10-CM | POA: Diagnosis not present

## 2022-08-16 DIAGNOSIS — Z96642 Presence of left artificial hip joint: Secondary | ICD-10-CM | POA: Diagnosis not present

## 2022-08-16 DIAGNOSIS — R296 Repeated falls: Secondary | ICD-10-CM | POA: Diagnosis not present

## 2022-08-30 ENCOUNTER — Ambulatory Visit: Payer: Medicare Other | Admitting: Neurology

## 2022-09-04 DIAGNOSIS — G20C Parkinsonism, unspecified: Secondary | ICD-10-CM | POA: Diagnosis not present

## 2022-09-04 DIAGNOSIS — Z96642 Presence of left artificial hip joint: Secondary | ICD-10-CM | POA: Diagnosis not present

## 2022-09-04 DIAGNOSIS — R482 Apraxia: Secondary | ICD-10-CM | POA: Diagnosis not present

## 2022-09-04 DIAGNOSIS — M6281 Muscle weakness (generalized): Secondary | ICD-10-CM | POA: Diagnosis not present

## 2022-09-04 DIAGNOSIS — R269 Unspecified abnormalities of gait and mobility: Secondary | ICD-10-CM | POA: Diagnosis not present

## 2022-09-04 DIAGNOSIS — R278 Other lack of coordination: Secondary | ICD-10-CM | POA: Diagnosis not present

## 2022-09-04 DIAGNOSIS — R2681 Unsteadiness on feet: Secondary | ICD-10-CM | POA: Diagnosis not present

## 2022-09-07 DIAGNOSIS — E1129 Type 2 diabetes mellitus with other diabetic kidney complication: Secondary | ICD-10-CM | POA: Diagnosis not present

## 2022-09-07 DIAGNOSIS — I25810 Atherosclerosis of coronary artery bypass graft(s) without angina pectoris: Secondary | ICD-10-CM | POA: Diagnosis not present

## 2022-09-07 DIAGNOSIS — I679 Cerebrovascular disease, unspecified: Secondary | ICD-10-CM | POA: Diagnosis not present

## 2022-09-07 DIAGNOSIS — I129 Hypertensive chronic kidney disease with stage 1 through stage 4 chronic kidney disease, or unspecified chronic kidney disease: Secondary | ICD-10-CM | POA: Diagnosis not present

## 2022-09-07 DIAGNOSIS — R2689 Other abnormalities of gait and mobility: Secondary | ICD-10-CM | POA: Diagnosis not present

## 2022-09-07 DIAGNOSIS — E1151 Type 2 diabetes mellitus with diabetic peripheral angiopathy without gangrene: Secondary | ICD-10-CM | POA: Diagnosis not present

## 2022-09-07 DIAGNOSIS — G214 Vascular parkinsonism: Secondary | ICD-10-CM | POA: Diagnosis not present

## 2022-09-07 DIAGNOSIS — E663 Overweight: Secondary | ICD-10-CM | POA: Diagnosis not present

## 2022-09-07 DIAGNOSIS — N1832 Chronic kidney disease, stage 3b: Secondary | ICD-10-CM | POA: Diagnosis not present

## 2022-09-07 DIAGNOSIS — E113299 Type 2 diabetes mellitus with mild nonproliferative diabetic retinopathy without macular edema, unspecified eye: Secondary | ICD-10-CM | POA: Diagnosis not present

## 2022-09-07 DIAGNOSIS — M810 Age-related osteoporosis without current pathological fracture: Secondary | ICD-10-CM | POA: Diagnosis not present

## 2022-09-07 DIAGNOSIS — E7800 Pure hypercholesterolemia, unspecified: Secondary | ICD-10-CM | POA: Diagnosis not present

## 2022-09-11 DIAGNOSIS — M6281 Muscle weakness (generalized): Secondary | ICD-10-CM | POA: Diagnosis not present

## 2022-09-11 DIAGNOSIS — R482 Apraxia: Secondary | ICD-10-CM | POA: Diagnosis not present

## 2022-09-11 DIAGNOSIS — G20C Parkinsonism, unspecified: Secondary | ICD-10-CM | POA: Diagnosis not present

## 2022-09-11 DIAGNOSIS — Z96642 Presence of left artificial hip joint: Secondary | ICD-10-CM | POA: Diagnosis not present

## 2022-09-11 DIAGNOSIS — R269 Unspecified abnormalities of gait and mobility: Secondary | ICD-10-CM | POA: Diagnosis not present

## 2022-09-11 DIAGNOSIS — R2681 Unsteadiness on feet: Secondary | ICD-10-CM | POA: Diagnosis not present

## 2022-10-17 ENCOUNTER — Encounter: Payer: Self-pay | Admitting: Neurology

## 2022-10-17 ENCOUNTER — Ambulatory Visit (INDEPENDENT_AMBULATORY_CARE_PROVIDER_SITE_OTHER): Payer: Medicare Other | Admitting: Neurology

## 2022-10-17 VITALS — BP 155/77 | HR 61 | Ht 72.0 in | Wt 192.0 lb

## 2022-10-17 DIAGNOSIS — R269 Unspecified abnormalities of gait and mobility: Secondary | ICD-10-CM

## 2022-10-17 DIAGNOSIS — R9082 White matter disease, unspecified: Secondary | ICD-10-CM | POA: Diagnosis not present

## 2022-10-17 DIAGNOSIS — R4189 Other symptoms and signs involving cognitive functions and awareness: Secondary | ICD-10-CM | POA: Diagnosis not present

## 2022-10-17 DIAGNOSIS — G214 Vascular parkinsonism: Secondary | ICD-10-CM | POA: Diagnosis not present

## 2022-10-17 NOTE — Patient Instructions (Addendum)
Referral for neuro psych evaluation  Continue Sinemet at current dosing Continue your exercise program See you back in 6 months   Orders Placed This Encounter  Procedures   Ambulatory referral to Neuropsychology

## 2022-10-17 NOTE — Progress Notes (Signed)
Patient: Joel Gonzales. Date of Birth: 1945-09-19  Reason for Visit: Follow up History from: Patient Primary Neurologist: [name]   ASSESSMENT AND PLAN 77 y.o. 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  Repeat MRI of the brain again showed moderate chronic small vessel ischemic disease and chronic cerebral microhemorrhages. Minimal progression of above finding since 2018.  Could repeat MRI of the cervical spine but cervical spine was neg in 2018 and has been having symptoms since them, he declined a sleep study.   -I do not see any significant rigidity or bradykinesia on exam today.  Seems to have benefited quite well from the addition of Sinemet 25/100 mg twice daily as well as physical therapy, now 3 times weekly cardiac rehab. -I will place a referral for neuropsychological evaluation.  His MoCA was 23/30 today, indicating mild cognitive impairment.  There is some discrepancy between he and his wife regarding his memory abilities. -Close follow-up with PCP for management of vascular risk factors with history of lacunar stroke, BP less than 130/90, A1c less than 7.0, LDL less than 70 -He will follow-up in 6 months or sooner if needed  HISTORY OF PRESENT ILLNESS: Today 10/17/22 Last visit with Dr. [name] 03/01/22.  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  2 negative DAT scans.  Multiple phone calls about memory change.  He was referred to neuropsych in December.  November 2023 he broke his left hip. Wife reports some concern with him getting agitated, frustrated easily. 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 He thinks his walking is good, at times catches himself speeding up. Has gone through PT. Takes Sinemet 25/100 mg twice daily, thinks makes his walking steady. He doesn't think much memory trouble, goes to cardiac rehab 3 days a week. He drives, no accidents. No other significant falls, some stumbles. He thinks doing well. MOCA 23/30.  HISTORY  Dr. [name] February 28, 2022: 77 year old male with a history of coronary artery disease status post CABG, diabetes, hypertension, hyperlipidemia here for follow-up gait abnormality, moderately advanced white-matter changges, gait abnormality, diagnosed with vascular parkinsonism 4 years ago at [facility], worsening gait and falls.  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  Repeat MRI of the brain again showed moderate chronic small vessel ischemic disease and chronic cerebral microhemorrhages.  Minimal progression of above finding since 2018.  Could repeat MRI of the cervical spine, he declined a sleep study at last appointment.   Already set up for PT at [facility]. Discussed findings, reviewed images, discussed vascular parkinsonism (NOT parkinson's disease), gave literature. Manage medically at this point, watch risk factors we discussed. He starts walking and can;t stop. We discussed sinemet.    MRI brain: 11/08/2021: 11/09/2021: DAT SCAN IMPRESSION: Ioflupane scan within normal limits. No reduced radiotracer activity in basal ganglia to suggest Parkinson's  syndrome pathology.   Of note, DaTSCAN is not diagnostic of Parkinsonian syndromes, which remains a clinical diagnosis. DaTscan is an adjuvant test to aid in the clinical  diagnosis of Parkinsonian syndromes.    IMPRESSION: MRI brain 11/08/2021   MRI brain (with and without) demonstrating: -Moderate chronic small vessel ischemic disease. -Multiple supratentorial and infratentorial chronic cerebral microhemorrhages. -No acute findings.  Minimal progression of above findings since 2018.     REVIEW OF SYSTEMS: Out of a complete 14 system review of symptoms, the patient complains only of the following symptoms, and all other reviewed systems are negative.  See HPI  ALLERGIES: No Known Allergies  HOME MEDICATIONS: Outpatient Medications Prior to Visit  Medication Sig Dispense Refill   atorvastatin (LIPITOR) 80 MG tablet Take 1 tablet (80 mg total) by mouth at bedtime. 90 tablet 3   carbidopa-levodopa (SINEMET IR) 25-100 MG tablet Take 1 tablet by mouth 2 (two) times daily. Take on an empty stomach or with crackers if makes nauseated, If have side effects try 1/2 tablet twice daily. 60 tablet 6   clopidogrel (PLAVIX) 75 MG tablet Take 1 tablet (75 mg total) by mouth daily. 30 tablet 0   Empagliflozin-metFORMIN HCl ER (SYNJARDY XR) 25-1000 MG TB24 Take 1 tablet by mouth daily. 30 tablet 3   metoprolol succinate (TOPROL-XL) 50 MG 24 hr tablet Take 1 tablet (50 mg total) by mouth at bedtime. 90 tablet 2   pantoprazole (PROTONIX) 40 MG tablet Take 40 mg by mouth daily.     pioglitazone (ACTOS) 15 MG tablet Take 1 tablet (15 mg total) by mouth daily. 90 tablet 3   ramipril (ALTACE) 5 MG capsule Take 1 capsule (5 mg total) by mouth 2 (two) times daily. 180 capsule 2   senna-docusate (SENOKOT-S) 8.6-50 MG tablet Take 2 tablets by mouth daily at 6 (six) AM. 60 tablet 0   Vitamin D, Ergocalciferol, (DRISDOL) 1.25 MG (50000 UNIT) CAPS capsule Take 1 capsule (50,000 Units total) by mouth every 7 (seven) days. 7 capsule 0   No facility-administered medications prior to visit.    PAST MEDICAL HISTORY: Past Medical History:  Diagnosis Date   CAD (coronary artery  disease)    Diabetes mellitus without complication (HCC)    Type 2   Family history of ASCVD    History of stress test 01/2012   which was entirely normal   Hx of CABG    x6 LIma to his LAD, a RIMA to his PDA, right radial to obtuse marginal branch, diagnonal, second marginal, & posteolateral branches   Hyperlipemia    Hypertension    RBBB    chronic    PAST SURGICAL HISTORY: Past Surgical History:  Procedure Laterality Date   CHOLECYSTECTOMY  2002   CORONARY ARTERY BYPASS GRAFT     x6 LIma to his LAD, a RIMA to his PDA, right radial to obtuse marginal branch, diagnonal, second marginal, & posteolateral branches   NOSE SURGERY  02/2013   carcinoma   TONSILLECTOMY     as a child   TOTAL HIP ARTHROPLASTY Right 02/18/2021   Procedure: TOTAL HIP ARTHROPLASTY ANTERIOR APPROACH;  Surgeon: Kathryne Hitch, MD;  Location: [facility] OR;  Service: Orthopedics;  Laterality: Right;   TOTAL HIP ARTHROPLASTY Left 03/03/2022   Procedure: TOTAL HIP ARTHROPLASTY ANTERIOR APPROACH;  Surgeon: Kathryne Hitch, MD;  Location: [facility] ORS;  Service: Orthopedics;  Laterality: Left;    FAMILY HISTORY: Family History  Problem Relation Age of Onset   Heart  attack Father 49       deceased, ASCVD   Hypertension Father    Heart disease Father    Heart Problems Mother        ASCVD   Hypertension Mother    Heart attack Mother    Heart attack Sister    Hypertension Brother    Hydrocephalus Brother     SOCIAL HISTORY: Social History   Socioeconomic History   Marital status: Married    Spouse name: Not on file   Number of children: 1   Years of education: Not on file   Highest education level: Bachelor's degree (e.g., BA, AB, BS)  Occupational History    Comment: [employer]  Tobacco Use   Smoking status: Never   Smokeless tobacco: Never  Vaping Use   Vaping status: Never Used  Substance and Sexual Activity   Alcohol use: Not Currently    Comment: minor   Drug use: No    Sexual activity: Yes    Birth control/protection: None  Other Topics Concern   Not on file  Social History Narrative   Lives at home with his wife   Right handed   Drinks at least 1 cup daily of caffeine   Social Determinants of Health   Financial Resource Strain: Not on file  Food Insecurity: No Food Insecurity (03/03/2022)   Hunger Vital Sign    Worried About Running Out of Food in the Last Year: Never true    Ran Out of Food in the Last Year: Never true  Transportation Needs: No Transportation Needs (03/03/2022)   PRAPARE - Administrator, Civil Service (Medical): No    Lack of Transportation (Non-Medical): No  Physical Activity: Not on file  Stress: Not on file  Social Connections: Not on file  Intimate Partner Violence: Not At Risk (03/03/2022)   Humiliation, Afraid, Rape, and Kick questionnaire    Fear of Current or Ex-Partner: No    Emotionally Abused: No    Physically Abused: No    Sexually Abused: No   PHYSICAL EXAM  Vitals:   10/17/22 1521 10/17/22 1526  BP: (!) 167/70 (!) 155/77  Pulse: 61 61  Weight: 192 lb (87.1 kg)   Height: 6' (1.829 m)    Body mass index is 26.04 kg/m.  Generalized: Well developed, in no acute distress  Neurological examination  Mentation: Alert oriented to time, place, history taking. Follows all commands speech and language fluent Cranial nerve II-XII: Pupils were equal round reactive to light. Extraocular movements were full, visual field were full on confrontational test. Facial sensation and strength were normal. Head turning and shoulder shrug  were normal and symmetric.  Mild masking of the face.  Hypophonia. Motor: The motor testing reveals 5 over 5 strength of all 4 extremities. Good symmetric motor tone is noted throughout.  No significant rigidity or bradykinesia. Sensory: Sensory testing is intact to soft touch on all 4 extremities. No evidence of extinction is noted.  Coordination: Cerebellar testing reveals good  finger-nose-finger and heel-to-shin bilaterally.  Gait and station: Able to stand from seated position with arms crossed, pace is good, no significant bradykinesia, slight decreased arm swing bilaterally.  Somewhat stooped. Reflexes: Deep tendon reflexes are symmetric and normal bilaterally.   DIAGNOSTIC DATA (LABS, IMAGING, TESTING) - I reviewed patient records, labs, notes, testing and imaging myself where available.  Lab Results  Component Value Date   WBC 10.4 03/12/2022   HGB 9.0 (L) 03/12/2022   HCT  26.8 (L) 03/12/2022   MCV 95.0 03/12/2022   PLT 406 (H) 03/12/2022      Component Value Date/Time   NA 137 03/13/2022 0556   NA 145 (H) 10/18/2021 1137   K 3.8 03/13/2022 0556   CL 104 03/13/2022 0556   CO2 25 03/13/2022 0556   GLUCOSE 113 (H) 03/13/2022 0556   BUN 18 03/13/2022 0556   BUN 22 10/18/2021 1137   CREATININE 1.22 03/13/2022 0556   CALCIUM 8.8 (L) 03/13/2022 0556   PROT 6.2 (L) 03/09/2022 0619   PROT 7.8 04/27/2020 1122   ALBUMIN 3.0 (L) 03/09/2022 0619   ALBUMIN 4.8 (H) 04/27/2020 1122   AST 28 03/09/2022 0619   ALT 28 03/09/2022 0619   ALKPHOS 68 03/09/2022 0619   BILITOT 1.1 03/09/2022 0619   BILITOT 0.6 04/27/2020 1122   GFRNONAA >60 03/13/2022 0556   GFRAA >60 03/04/2017 1823   Lab Results  Component Value Date   CHOL 167 04/27/2020   HDL 46 04/27/2020   LDLCALC 90 04/27/2020   TRIG 179 (H) 04/27/2020   CHOLHDL 3.6 04/27/2020   Lab Results  Component Value Date   HGBA1C 6.9 (H) 03/02/2022   No results found for: "VITAMINB12" Lab Results  Component Value Date   TSH 2.254 03/04/2017    Margie Ege, AGNP-C, DNP 10/17/2022, 4:12 PM [facility] [address] [place], [place] [address] [phone]

## 2022-10-18 ENCOUNTER — Telehealth: Payer: Self-pay | Admitting: Neurology

## 2022-10-18 NOTE — Telephone Encounter (Signed)
 Referral for neuropsychology fax to [facility]. Phone:[phone], Fax: [phone]

## 2022-11-07 ENCOUNTER — Ambulatory Visit (HOSPITAL_BASED_OUTPATIENT_CLINIC_OR_DEPARTMENT_OTHER): Payer: Medicare Other | Admitting: Student

## 2022-11-07 ENCOUNTER — Telehealth: Payer: Self-pay | Admitting: Orthopaedic Surgery

## 2022-11-07 ENCOUNTER — Other Ambulatory Visit (INDEPENDENT_AMBULATORY_CARE_PROVIDER_SITE_OTHER): Payer: Medicare Other

## 2022-11-07 ENCOUNTER — Ambulatory Visit (INDEPENDENT_AMBULATORY_CARE_PROVIDER_SITE_OTHER): Payer: Medicare Other | Admitting: Physician Assistant

## 2022-11-07 DIAGNOSIS — Z96642 Presence of left artificial hip joint: Secondary | ICD-10-CM

## 2022-11-07 DIAGNOSIS — M25571 Pain in right ankle and joints of right foot: Secondary | ICD-10-CM | POA: Diagnosis not present

## 2022-11-07 DIAGNOSIS — M79671 Pain in right foot: Secondary | ICD-10-CM

## 2022-11-07 MED ORDER — LIDOCAINE HCL 1 % IJ SOLN
0.5000 mL | INTRAMUSCULAR | Status: AC | PRN
  Administered 2022-11-07: .5 mL

## 2022-11-07 MED ORDER — METHYLPREDNISOLONE ACETATE 40 MG/ML IJ SUSP
20.0000 mg | INTRAMUSCULAR | Status: AC | PRN
  Administered 2022-11-07: 20 mg via INTRA_ARTICULAR

## 2022-11-07 NOTE — Progress Notes (Signed)
Office Visit Note   Patient: Joel Gonzales.           Date of Birth: [birth year]           MRN: [id number] Visit Date: 11/07/2022              Requested by: Gaspar Garbe, MD [address] [place],  [place] [address] PCP: Gaspar Garbe, MD   Assessment & Plan: Visit Diagnoses:  1. Right foot pain   2. History of left hip replacement   3. Sinus tarsi syndrome of right ankle     Plan: He will follow-up with [place] as needed pain persist in the ankle or becomes worse.  Questions were encouraged and answered.  After the sinus Tarsi injection he was able to ambulate about the room without any pain. Regards to his left hip he can follow-up as needed.  Questions were encouraged and answered  Follow-Up Instructions: Return if symptoms worsen or fail to improve.   Orders:  Orders Placed This Encounter  Procedures   Small Joint Inj   XR Foot Complete Right   XR Pelvis 1-2 Views   No orders of the defined types were placed in this encounter.     Procedures: Small Joint Inj: R intertarsal on 11/07/2022 4:06 PM Medications: 0.5 mL lidocaine 1 %; 20 mg methylPREDNISolone acetate 40 MG/ML      Clinical Data: No additional findings.   Subjective: Chief Complaint  Patient presents with   Right Ankle - Pain   Right Foot - Pain    HPI Mr. Fedorov comes in today for 2 reasons.  He reports about 2 weeks ago he thought he sprained his right ankle.  He points to the anterior lateral aspect of his foot as the pain no known injury.  He has had no redness no significant swelling or bruising.  Does have a history of gout but does not feel this is gout related.  He also was due due to return for follow-up of his left total hip arthroplasty is secondary to left displaced subcapital femoral neck fracture on 03/03/2022.  He states he has no pain in the hip whatsoever.  Patient is diabetic reports hemoglobin A1c to be around 6.7.  Review of Systems See HPI  Objective: Vital Signs:  There were no vitals taken for this visit.  Physical Exam Constitutional:      Appearance: He is normal weight. He is not ill-appearing or diaphoretic.  Cardiovascular:     Pulses: Normal pulses.  Pulmonary:     Effort: Pulmonary effort is normal.  Neurological:     Mental Status: He is alert and oriented to person, place, and time.  Psychiatric:        Mood and Affect: Mood normal.     Ortho Exam Bilateral hips excellent range of motion without pain.  Ambulates without any assistive devices nonantalgic gait. Bilateral feet dorsal pedal pulses are 2+ and equal symmetric.  No ecchymosis erythema or edema of either foot.  5 out of 5 strength with inversion eversion against resistance bilateral feet.  Nontender over the posterior tibial tendon and peroneal tendons bilaterally.  Nontender over the Achilles bilaterally.  Tenderness over the right sinus Tarsi region.  Nontender over the left sinus Tarsi region. Specialty Comments:  No specialty comments available.  Imaging: XR Foot Complete Right  Result Date: 11/07/2022 Right foot 3 views: No acute fractures.  First MP joint bone-on-bone arthritic changes.  Others subluxations dislocations or abnormal  findings.  XR Pelvis 1-2 Views  Result Date: 11/07/2022 AP pelvis: Bilateral hips well located.  Status post bilateral total hip arthroplasties.  Well-seated components.  No acute fractures or acute findings.    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   White matter abnormality on MRI of brain 03/01/2022   Malnutrition of moderate degree 02/19/2021   Femoral fracture (HCC) 02/17/2021   Hip  fracture (HCC) 02/17/2021   Acute ischemic stroke (HCC) 03/05/2017   Gait disturbance 03/05/2017   Unsteadiness    Vascular parkinsonism (HCC)    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   Essential hypertension 02/13/2013   Hyperlipidemia with target LDL less than 130 02/13/2013   Right bundle branch block 02/13/2013   Past Medical History:  Diagnosis Date   CAD (coronary artery disease)    Diabetes mellitus without complication (HCC)    Type 2   Family history of ASCVD    History of stress test 01/2012   which was entirely normal   Hx of CABG    x6 LIma to his LAD, a RIMA to his PDA, right radial to obtuse marginal branch, diagnonal, second marginal, & posteolateral branches   Hyperlipemia    Hypertension    RBBB    chronic    Family History  Problem Relation Age of Onset   Heart attack Father 3       deceased, ASCVD   Hypertension Father    Heart disease Father    Heart Problems Mother        ASCVD   Hypertension Mother    Heart attack Mother    Heart attack Sister    Hypertension Brother    Hydrocephalus Brother     Past Surgical History:  Procedure Laterality Date   CHOLECYSTECTOMY  2002   CORONARY ARTERY BYPASS GRAFT     x6 LIma to his LAD, a RIMA to his PDA, right radial to obtuse marginal branch, diagnonal, second marginal, & posteolateral branches   NOSE SURGERY  02/2013   carcinoma   TONSILLECTOMY     as a child   TOTAL HIP ARTHROPLASTY Right 02/18/2021   Procedure: TOTAL HIP ARTHROPLASTY ANTERIOR APPROACH;  Surgeon: Kathryne Hitch, MD;  Location: [facility];  Service: Orthopedics;  Laterality: Right;   TOTAL HIP ARTHROPLASTY Left 03/03/2022   Procedure: TOTAL HIP ARTHROPLASTY ANTERIOR APPROACH;  Surgeon: Kathryne Hitch, MD;  Location: [facility];  Service: Orthopedics;  Laterality: Left;   Social History   Occupational History    Comment: Research officer, trade union  Tobacco Use   Smoking status: Never   Smokeless tobacco: Never  Vaping Use   Vaping status:  Never Used  Substance and Sexual Activity   Alcohol use: Not Currently    Comment: minor   Drug use: No   Sexual activity: Yes    Birth control/protection: None

## 2022-11-09 ENCOUNTER — Telehealth: Payer: Self-pay | Admitting: Orthopaedic Surgery

## 2022-11-09 NOTE — Telephone Encounter (Signed)
Patient called trying to see if he needs to come to his appt Monday being that Sullivan Lone did an xray on his hip. [phone]

## 2022-11-10 NOTE — Telephone Encounter (Signed)
Patient aware to FU as needed

## 2022-11-13 ENCOUNTER — Telehealth: Payer: Self-pay | Admitting: Orthopaedic Surgery

## 2022-11-13 ENCOUNTER — Ambulatory Visit: Payer: Medicare Other | Admitting: Orthopaedic Surgery

## 2022-11-13 NOTE — Telephone Encounter (Signed)
Patient called and wanting to know if you wanted to do an MRI on the right ankle but he is in a lot of pain.[phone]

## 2022-11-14 ENCOUNTER — Telehealth: Payer: Self-pay | Admitting: Physician Assistant

## 2022-11-14 ENCOUNTER — Other Ambulatory Visit: Payer: Self-pay

## 2022-11-14 DIAGNOSIS — M25571 Pain in right ankle and joints of right foot: Secondary | ICD-10-CM

## 2022-11-14 DIAGNOSIS — M79671 Pain in right foot: Secondary | ICD-10-CM

## 2022-11-14 NOTE — Telephone Encounter (Signed)
Noted and changed order to [facility]

## 2022-11-14 NOTE — Telephone Encounter (Signed)
Patient called advised his right ankle is not any better and wanted to know what is his next plan of care. Patient asked if he can be set up for an MRI?  Patient said his ankle is hurting pretty bad. Patient said his ankle is worse than before.  Patient asked if he need to come in to see Dr. Magnus Ivan or Bronson Curb?  The number to contact patient is [phone]

## 2022-11-14 NOTE — Telephone Encounter (Signed)
Patient called. Says his r foot is not better. Would like Bronson Curb to call him. [phone]

## 2022-11-17 ENCOUNTER — Ambulatory Visit (HOSPITAL_COMMUNITY)
Admission: RE | Admit: 2022-11-17 | Discharge: 2022-11-17 | Disposition: A | Discharge status: Home / Self Care | Payer: Medicare Other | Source: Ambulatory Visit | Attending: Physician Assistant | Admitting: Physician Assistant

## 2022-11-17 DIAGNOSIS — M79671 Pain in right foot: Secondary | ICD-10-CM | POA: Insufficient documentation

## 2022-11-17 DIAGNOSIS — M7989 Other specified soft tissue disorders: Secondary | ICD-10-CM | POA: Diagnosis not present

## 2022-11-17 DIAGNOSIS — Q6671 Congenital pes cavus, right foot: Secondary | ICD-10-CM | POA: Diagnosis not present

## 2022-11-17 DIAGNOSIS — M25571 Pain in right ankle and joints of right foot: Secondary | ICD-10-CM | POA: Insufficient documentation

## 2022-11-17 DIAGNOSIS — M19071 Primary osteoarthritis, right ankle and foot: Secondary | ICD-10-CM | POA: Diagnosis not present

## 2022-11-17 DIAGNOSIS — M659 Synovitis and tenosynovitis, unspecified: Secondary | ICD-10-CM | POA: Diagnosis not present

## 2022-12-06 ENCOUNTER — Other Ambulatory Visit: Payer: Self-pay

## 2022-12-06 DIAGNOSIS — M25571 Pain in right ankle and joints of right foot: Secondary | ICD-10-CM

## 2022-12-07 ENCOUNTER — Ambulatory Visit: Payer: Medicare Other | Admitting: Physician Assistant

## 2022-12-15 DIAGNOSIS — Z23 Encounter for immunization: Secondary | ICD-10-CM | POA: Diagnosis not present

## 2022-12-20 ENCOUNTER — Telehealth: Payer: Self-pay | Admitting: Physician Assistant

## 2022-12-20 NOTE — Telephone Encounter (Signed)
Patient called and said that Joel Gonzales rferred him to a doctor in [place] but never heard from him. He was told to call gilbert back if he didn't hear anything.[phone]

## 2022-12-21 ENCOUNTER — Telehealth: Payer: Self-pay | Admitting: Orthopaedic Surgery

## 2022-12-21 NOTE — Telephone Encounter (Signed)
Pt called requesting a disc of his ankle xray. Please call pt when ready for pick up. Pt phone number is [phone].

## 2022-12-21 NOTE — Telephone Encounter (Signed)
ype Date User Summary Attachment  General 12/20/2022 11:27 AM Samuella Cota, CMA Referral faxed to [facility] orthopedics to Dr. Lorin Picket -  Note: Referral faxed to [facility] orthopedics to Dr. Lorin Picket

## 2022-12-21 NOTE — Telephone Encounter (Signed)
Called pt and informed him of the referral

## 2022-12-22 NOTE — Telephone Encounter (Signed)
Called and advised patient CD ready for pick up

## 2022-12-25 DIAGNOSIS — M25571 Pain in right ankle and joints of right foot: Secondary | ICD-10-CM | POA: Diagnosis not present

## 2023-01-02 DIAGNOSIS — H5213 Myopia, bilateral: Secondary | ICD-10-CM | POA: Diagnosis not present

## 2023-01-02 DIAGNOSIS — H2513 Age-related nuclear cataract, bilateral: Secondary | ICD-10-CM | POA: Diagnosis not present

## 2023-01-02 DIAGNOSIS — G3184 Mild cognitive impairment, so stated: Secondary | ICD-10-CM | POA: Diagnosis not present

## 2023-01-02 DIAGNOSIS — H25013 Cortical age-related cataract, bilateral: Secondary | ICD-10-CM | POA: Diagnosis not present

## 2023-01-02 DIAGNOSIS — H52203 Unspecified astigmatism, bilateral: Secondary | ICD-10-CM | POA: Diagnosis not present

## 2023-01-02 DIAGNOSIS — H524 Presbyopia: Secondary | ICD-10-CM | POA: Diagnosis not present

## 2023-01-02 DIAGNOSIS — E113211 Type 2 diabetes mellitus with mild nonproliferative diabetic retinopathy with macular edema, right eye: Secondary | ICD-10-CM | POA: Diagnosis not present

## 2023-01-15 DIAGNOSIS — G3184 Mild cognitive impairment, so stated: Secondary | ICD-10-CM | POA: Insufficient documentation

## 2023-01-29 DIAGNOSIS — M25511 Pain in right shoulder: Secondary | ICD-10-CM | POA: Insufficient documentation

## 2023-02-14 DIAGNOSIS — E7800 Pure hypercholesterolemia, unspecified: Secondary | ICD-10-CM | POA: Diagnosis not present

## 2023-02-14 DIAGNOSIS — E1129 Type 2 diabetes mellitus with other diabetic kidney complication: Secondary | ICD-10-CM | POA: Diagnosis not present

## 2023-02-14 DIAGNOSIS — I129 Hypertensive chronic kidney disease with stage 1 through stage 4 chronic kidney disease, or unspecified chronic kidney disease: Secondary | ICD-10-CM | POA: Diagnosis not present

## 2023-02-14 DIAGNOSIS — N1832 Chronic kidney disease, stage 3b: Secondary | ICD-10-CM | POA: Diagnosis not present

## 2023-02-14 DIAGNOSIS — M810 Age-related osteoporosis without current pathological fracture: Secondary | ICD-10-CM | POA: Diagnosis not present

## 2023-02-14 DIAGNOSIS — I25810 Atherosclerosis of coronary artery bypass graft(s) without angina pectoris: Secondary | ICD-10-CM | POA: Diagnosis not present

## 2023-02-14 DIAGNOSIS — Z125 Encounter for screening for malignant neoplasm of prostate: Secondary | ICD-10-CM | POA: Diagnosis not present

## 2023-02-20 DIAGNOSIS — M25511 Pain in right shoulder: Secondary | ICD-10-CM | POA: Diagnosis not present

## 2023-02-21 DIAGNOSIS — M810 Age-related osteoporosis without current pathological fracture: Secondary | ICD-10-CM | POA: Diagnosis not present

## 2023-02-21 DIAGNOSIS — G214 Vascular parkinsonism: Secondary | ICD-10-CM | POA: Diagnosis not present

## 2023-02-21 DIAGNOSIS — E663 Overweight: Secondary | ICD-10-CM | POA: Diagnosis not present

## 2023-02-21 DIAGNOSIS — Z0000 Encounter for general adult medical examination without abnormal findings: Secondary | ICD-10-CM | POA: Diagnosis not present

## 2023-02-21 DIAGNOSIS — I679 Cerebrovascular disease, unspecified: Secondary | ICD-10-CM | POA: Diagnosis not present

## 2023-02-21 DIAGNOSIS — N1832 Chronic kidney disease, stage 3b: Secondary | ICD-10-CM | POA: Diagnosis not present

## 2023-02-21 DIAGNOSIS — I1310 Hypertensive heart and chronic kidney disease without heart failure, with stage 1 through stage 4 chronic kidney disease, or unspecified chronic kidney disease: Secondary | ICD-10-CM | POA: Diagnosis not present

## 2023-02-21 DIAGNOSIS — Z1339 Encounter for screening examination for other mental health and behavioral disorders: Secondary | ICD-10-CM | POA: Diagnosis not present

## 2023-02-21 DIAGNOSIS — Z1331 Encounter for screening for depression: Secondary | ICD-10-CM | POA: Diagnosis not present

## 2023-02-21 DIAGNOSIS — E1151 Type 2 diabetes mellitus with diabetic peripheral angiopathy without gangrene: Secondary | ICD-10-CM | POA: Diagnosis not present

## 2023-02-21 DIAGNOSIS — E1129 Type 2 diabetes mellitus with other diabetic kidney complication: Secondary | ICD-10-CM | POA: Diagnosis not present

## 2023-02-21 DIAGNOSIS — E7800 Pure hypercholesterolemia, unspecified: Secondary | ICD-10-CM | POA: Diagnosis not present

## 2023-02-21 DIAGNOSIS — I25810 Atherosclerosis of coronary artery bypass graft(s) without angina pectoris: Secondary | ICD-10-CM | POA: Diagnosis not present

## 2023-02-21 DIAGNOSIS — E113299 Type 2 diabetes mellitus with mild nonproliferative diabetic retinopathy without macular edema, unspecified eye: Secondary | ICD-10-CM | POA: Diagnosis not present

## 2023-02-22 DIAGNOSIS — E1129 Type 2 diabetes mellitus with other diabetic kidney complication: Secondary | ICD-10-CM | POA: Diagnosis not present

## 2023-02-22 DIAGNOSIS — R82998 Other abnormal findings in urine: Secondary | ICD-10-CM | POA: Diagnosis not present

## 2023-02-23 DIAGNOSIS — Z1212 Encounter for screening for malignant neoplasm of rectum: Secondary | ICD-10-CM | POA: Diagnosis not present

## 2023-02-27 DIAGNOSIS — R059 Cough, unspecified: Secondary | ICD-10-CM | POA: Diagnosis not present

## 2023-02-27 DIAGNOSIS — J069 Acute upper respiratory infection, unspecified: Secondary | ICD-10-CM | POA: Diagnosis not present

## 2023-02-27 DIAGNOSIS — Z1152 Encounter for screening for COVID-19: Secondary | ICD-10-CM | POA: Diagnosis not present

## 2023-02-27 DIAGNOSIS — R5383 Other fatigue: Secondary | ICD-10-CM | POA: Diagnosis not present

## 2023-02-27 DIAGNOSIS — J029 Acute pharyngitis, unspecified: Secondary | ICD-10-CM | POA: Diagnosis not present

## 2023-02-27 DIAGNOSIS — R0981 Nasal congestion: Secondary | ICD-10-CM | POA: Diagnosis not present

## 2023-05-01 ENCOUNTER — Ambulatory Visit (INDEPENDENT_AMBULATORY_CARE_PROVIDER_SITE_OTHER): Payer: Medicare Other | Admitting: Neurology

## 2023-05-01 ENCOUNTER — Encounter: Payer: Self-pay | Admitting: Neurology

## 2023-05-01 ENCOUNTER — Telehealth: Payer: Self-pay | Admitting: Neurology

## 2023-05-01 VITALS — BP 164/84 | HR 63 | Ht 72.0 in | Wt 194.0 lb

## 2023-05-01 DIAGNOSIS — R482 Apraxia: Secondary | ICD-10-CM

## 2023-05-01 DIAGNOSIS — G214 Vascular parkinsonism: Secondary | ICD-10-CM

## 2023-05-01 DIAGNOSIS — R9082 White matter disease, unspecified: Secondary | ICD-10-CM

## 2023-05-01 NOTE — Patient Instructions (Addendum)
Referral to PT  Try moving dose of Sinemet to mid afternoon, may add in a 3rd if needed Strict management of vascular risk factors with a goal BP less than 130/90, A1c less than 7.0, LDL less than 70 for secondary stroke prevention Follow up in 6 months

## 2023-05-01 NOTE — Progress Notes (Signed)
Patient: Joel Gonzales. Date of Birth: 30-Jul-1945  Reason for Visit: Follow up History from: Patient, wife  Primary Neurologist: [name]   ASSESSMENT AND PLAN 78 y.o. year old male Diagnosed with vascular parkinsonism at [facility] movement disorders center. 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  Repeat MRI of the brain again showed moderate chronic small vessel ischemic disease and chronic cerebral microhemorrhages. Minimal progression of above finding since 2018.  Could repeat MRI of the cervical spine but cervical spine was neg in 2018 and has been having symptoms since then, he declined a sleep study. He denies any memory issues, apparently miscommunication about calling in, was more frustration issue.  -Referral to physical therapy for muscle strengthening exercises, gait and balance training -Fatigue, weakness in the late afternoon, try moving Sinemet to early afternoon, keep AM dosing, can even try taking 25/100 mg 1 tablet TID if benefit to strength, stamina.  However unclear benefit since not true Parkinson's disease. -We discussed pursuing sleep consult, he is not interested at this time  -Close follow-up with PCP for management of vascular risk factors with history of lacunar stroke, BP less than 130/90, A1c less than 7.0, LDL less than 70 -He will follow-up in 6 months or sooner if needed with Dr. [name], he was originally upset that I didn't remember him from seeing his wife, as well as questioning his memory. In the end the issue was resolved.   HISTORY OF PRESENT ILLNESS: Today 05/01/23 Here with his wife, mentions concern about our last visit that I questioned his memory, I didn't remember him coming with his wife who is also my patient.  Dec 2023 wife called about memory/anger issues, Dr. [name] referred for neuropsych, I placed the order again in July 2024. Was not completed. They claim never memory issues, was more anger issues, frustration. Has mobility issues, from 2 broken hips. Still involved in landscape nursery business, is active, just got back from 2 weeks trip to [place], he drove. He has poor balance. Around 4 PM he gets fatigued, after working all day. His upper legs are weak. Takes Sinemet 25/100 mg BID in the AM/PM at bedtime. He had a fall back in October, injured right shoulder rotator cuff. Is enrolled in cardiac rehab in [place], but doesn't go. Denies any issues with his memory.  10/17/22 SS: Last visit with Dr. [name] 03/01/22.  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  2 negative DAT scans.  Multiple phone calls about memory change.  He was referred to neuropsych in December.  November 2023 he broke his left hip. Wife reports some concern with him getting agitated, frustrated easily. 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 He thinks his walking is good, at times catches himself speeding up. Has gone through PT. Takes Sinemet 25/100 mg twice daily, thinks makes his walking steady. He doesn't think much memory trouble, goes to cardiac rehab 3 days a week. He drives, no accidents. No other significant falls, some stumbles. He thinks doing well. MOCA 23/30.  HISTORY  Dr. [name] February 28, 2022: 78 year old male with a history of coronary artery disease status post CABG, diabetes, hypertension, hyperlipidemia here for follow-up gait abnormality, moderately advanced white-matter  changges, gait abnormality, diagnosed with vascular parkinsonism 4 years ago at [facility], worsening gait and falls.  Diagnosed with vascular parkinsonism at [facility] movement disorders center.   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  Repeat MRI of the brain again showed moderate chronic small vessel ischemic disease and chronic cerebral microhemorrhages.  Minimal progression of above finding since 2018.  Could repeat MRI of the cervical spine, he declined a sleep study at last appointment.   Already set up for PT at [facility]. Discussed findings, reviewed images, discussed vascular parkinsonism (NOT parkinson's disease), gave literature. Manage medically at this point, watch risk factors we discussed. He starts walking and can;t stop. We discussed sinemet.    MRI brain: 11/08/2021: 11/09/2021: DAT SCAN IMPRESSION: Ioflupane scan within normal limits. No reduced radiotracer activity in basal ganglia to suggest Parkinson's syndrome pathology.   Of note, DaTSCAN is not diagnostic of Parkinsonian syndromes, which remains a clinical diagnosis. DaTscan is an adjuvant test to aid in the clinical diagnosis of Parkinsonian syndromes.    IMPRESSION: MRI brain 11/08/2021   MRI brain (with and without) demonstrating: -Moderate chronic small vessel ischemic disease. -Multiple supratentorial and infratentorial chronic cerebral microhemorrhages. -No acute findings.  Minimal progression of above findings since 2018.     REVIEW OF SYSTEMS: Out of a complete 14 system review of symptoms, the patient complains only of the following symptoms, and all other reviewed systems are negative.  See HPI  ALLERGIES: No Known Allergies  HOME MEDICATIONS: Outpatient Medications Prior to Visit  Medication Sig Dispense Refill   atorvastatin (LIPITOR) 80 MG tablet Take 1 tablet (80 mg total) by mouth at bedtime. 90 tablet 3   carbidopa-levodopa (SINEMET IR) 25-100 MG tablet Take 1 tablet by mouth 2 (two) times daily. Take on an empty stomach or with  crackers if makes nauseated, If have side effects try 1/2 tablet twice daily. 60 tablet 6   clopidogrel (PLAVIX) 75 MG tablet Take 1 tablet (75 mg total) by mouth daily. 30 tablet 0   Empagliflozin-metFORMIN HCl ER (SYNJARDY XR) 25-1000 MG TB24 Take 1 tablet by mouth daily. 30 tablet 3   metoprolol succinate (TOPROL-XL) 50 MG 24 hr tablet Take 1 tablet (50 mg total) by mouth at bedtime. 90 tablet 2   pantoprazole (PROTONIX) 40 MG tablet Take 40 mg by mouth daily.     pioglitazone (ACTOS) 15 MG tablet Take 1 tablet (15 mg total) by mouth daily. 90 tablet 3   ramipril (ALTACE) 5 MG capsule Take 1 capsule (5 mg total) by mouth 2 (two) times daily. 180 capsule 2   No facility-administered medications prior to visit.    PAST MEDICAL HISTORY: Past Medical History:  Diagnosis Date   CAD (coronary artery disease)    Diabetes mellitus without complication (HCC)    Type 2   Family history of ASCVD    History of stress test 01/2012   which was entirely normal   Hx of CABG    x6 LIma to his LAD, a RIMA to his PDA, right radial to obtuse marginal branch, diagnonal, second marginal, & posteolateral branches   Hyperlipemia    Hypertension    RBBB    chronic    PAST SURGICAL HISTORY: Past Surgical History:  Procedure Laterality Date   CHOLECYSTECTOMY  2002   CORONARY ARTERY BYPASS GRAFT     x6 LIma to his LAD, a RIMA to his PDA, right radial to obtuse marginal branch, diagnonal, second marginal, & posteolateral branches   NOSE SURGERY  02/2013   carcinoma   TONSILLECTOMY     as a child   TOTAL HIP ARTHROPLASTY Right 02/18/2021   Procedure: TOTAL HIP ARTHROPLASTY ANTERIOR APPROACH;  Surgeon: Kathryne Hitch, MD;  Location: [facility] OR;  Service: Orthopedics;  Laterality: Right;   TOTAL HIP ARTHROPLASTY Left 03/03/2022   Procedure: TOTAL HIP ARTHROPLASTY ANTERIOR APPROACH;  Surgeon: Kathryne Hitch, MD;  Location: [facility];  Service: Orthopedics;  Laterality: Left;    FAMILY  HISTORY: Family History  Problem Relation Age of Onset   Heart attack Father 57       deceased, ASCVD   Hypertension Father    Heart disease Father    Heart Problems Mother        ASCVD   Hypertension Mother    Heart attack Mother    Heart attack Sister    Hypertension Brother    Hydrocephalus Brother     SOCIAL HISTORY: Social History   Socioeconomic History   Marital status: Married    Spouse name: Not on file   Number of children: 1   Years of education: Not on file   Highest education level: Bachelor's degree (e.g., BA, AB, BS)  Occupational History    Comment: [employer]  Tobacco Use   Smoking status: Never   Smokeless tobacco: Never  Vaping Use   Vaping status: Never Used  Substance and Sexual Activity   Alcohol use: Not Currently    Comment: minor   Drug use: No   Sexual activity: Yes    Birth control/protection: None  Other Topics Concern   Not on file  Social History Narrative   Lives at home with his wife   Right handed   Drinks at least 1 cup daily of caffeine   Social Drivers of Corporate investment banker Strain: Not on file  Food Insecurity: No Food Insecurity (03/03/2022)   Hunger Vital Sign    Worried About Running Out of Food in the Last Year: Never true    Ran Out of Food in the Last Year: Never true  Transportation Needs: No Transportation Needs (03/03/2022)   PRAPARE - Administrator, Civil Service (Medical): No    Lack of Transportation (Non-Medical): No  Physical Activity: Not on file  Stress: Not on file  Social Connections: Not on file  Intimate Partner Violence: Not At Risk (03/03/2022)   Humiliation, Afraid, Rape, and Kick questionnaire    Fear of Current or Ex-Partner: No    Emotionally Abused: No    Physically Abused: No    Sexually Abused: No   PHYSICAL EXAM  Vitals:   05/01/23 1333  BP: (!) 164/84  Pulse: 63  Weight: 194 lb (88 kg)  Height: 6' (1.829 m)    Body mass index is 26.31  kg/m.  Generalized: Well developed, in no acute distress  Neurological examination  Mentation: Alert oriented to time, place, history taking. Follows all commands speech and language fluent Cranial nerve II-XII: Pupils were equal round reactive to light. Extraocular movements were full, visual field were full on confrontational test. Facial sensation and strength were normal. Head turning and shoulder shrug  were normal and symmetric.  Mild masking of the face.  Hypophonia.  Motor: The motor testing reveals 5 over 5 strength of all 4 extremities. Good symmetric motor tone is noted throughout.  No significant rigidity or bradykinesia. Sensory: Sensory testing is intact to soft touch on all 4 extremities. No evidence of extinction is noted.  Coordination: Cerebellar testing reveals good finger-nose-finger and heel-to-shin bilaterally.  Gait and station: Able to stand from seated position with arms crossed but rocks twice, pace is good, slight limp on the right, no significant bradykinesia, slight decreased arm swing bilaterally.  Somewhat stooped. Reflexes: Deep tendon reflexes are symmetric and normal bilaterally.   DIAGNOSTIC DATA (LABS, IMAGING, TESTING) - I reviewed patient records, labs, notes, testing and imaging myself where available.  Lab Results  Component Value Date   WBC 10.4 03/12/2022   HGB 9.0 (L) 03/12/2022   HCT 26.8 (L) 03/12/2022   MCV 95.0 03/12/2022   PLT 406 (H) 03/12/2022      Component Value Date/Time   NA 137 03/13/2022 0556   NA 145 (H) 10/18/2021 1137   K 3.8 03/13/2022 0556   CL 104 03/13/2022 0556   CO2 25 03/13/2022 0556   GLUCOSE 113 (H) 03/13/2022 0556   BUN 18 03/13/2022 0556   BUN 22 10/18/2021 1137   CREATININE 1.22 03/13/2022 0556   CALCIUM 8.8 (L) 03/13/2022 0556   PROT 6.2 (L) 03/09/2022 0619   PROT 7.8 04/27/2020 1122   ALBUMIN 3.0 (L) 03/09/2022 0619   ALBUMIN 4.8 (H) 04/27/2020 1122   AST 28 03/09/2022 0619   ALT 28 03/09/2022 0619    ALKPHOS 68 03/09/2022 0619   BILITOT 1.1 03/09/2022 0619   BILITOT 0.6 04/27/2020 1122   GFRNONAA >60 03/13/2022 0556   GFRAA >60 03/04/2017 1823   Lab Results  Component Value Date   CHOL 167 04/27/2020   HDL 46 04/27/2020   LDLCALC 90 04/27/2020   TRIG 179 (H) 04/27/2020   CHOLHDL 3.6 04/27/2020   Lab Results  Component Value Date   HGBA1C 6.9 (H) 03/02/2022   No results found for: "VITAMINB12" Lab Results  Component Value Date   TSH 2.254 03/04/2017    Margie Ege, AGNP-C, DNP 05/01/2023, 1:45 PM [facility] [address], [address] [place], [place] [address] [phone]

## 2023-05-01 NOTE — Telephone Encounter (Signed)
Referral for physical therapy fax to [facility] as requested. Phone: [phone], Fax: [phone]

## 2023-05-08 ENCOUNTER — Other Ambulatory Visit: Payer: Self-pay | Admitting: Neurology

## 2023-05-10 ENCOUNTER — Other Ambulatory Visit: Payer: Self-pay | Admitting: Neurology

## 2023-05-10 NOTE — Telephone Encounter (Signed)
 Pt is asking about when this  carbidopa -levodopa  (SINEMET  IR) 25-100 MG tablet  will be called into [facility] , he asked it be noted that he is down to 1 days worth

## 2023-05-11 ENCOUNTER — Telehealth: Payer: Self-pay | Admitting: Neurology

## 2023-05-11 NOTE — Telephone Encounter (Signed)
 Refill on Sinemet  25-100 mg has been sent to [facility] pharmacy as requested by pt.

## 2023-05-11 NOTE — Telephone Encounter (Signed)
 Pt reports he is down to a days worth of his carbidopa -levodopa  (SINEMET  IR) 25-100 MG tablet.  He states it was his understanding that Dr Ines would begin managing his carbidopa -levodopa  (SINEMET  IR) 25-100 MG tablet.  Pt is asking if this can please be called into [facility] for him, please call.

## 2023-05-16 ENCOUNTER — Encounter: Payer: Self-pay | Admitting: Cardiovascular Disease

## 2023-05-16 ENCOUNTER — Ambulatory Visit: Payer: Medicare Other | Attending: Cardiovascular Disease | Admitting: Cardiovascular Disease

## 2023-05-16 VITALS — BP 150/72 | HR 59 | Ht 72.0 in | Wt 195.8 lb

## 2023-05-16 DIAGNOSIS — I4510 Unspecified right bundle-branch block: Secondary | ICD-10-CM | POA: Diagnosis not present

## 2023-05-16 DIAGNOSIS — E785 Hyperlipidemia, unspecified: Secondary | ICD-10-CM | POA: Diagnosis not present

## 2023-05-16 DIAGNOSIS — Z951 Presence of aortocoronary bypass graft: Secondary | ICD-10-CM | POA: Diagnosis not present

## 2023-05-16 DIAGNOSIS — I10 Essential (primary) hypertension: Secondary | ICD-10-CM | POA: Diagnosis not present

## 2023-05-16 NOTE — Assessment & Plan Note (Signed)
History of essential hypertension with blood pressure measured today at 150/72.  He is on metoprolol and ramipril.

## 2023-05-16 NOTE — Progress Notes (Signed)
05/16/2023 Joel Gonzales   [birth year]  [id number]  Primary Physician Tisovec, Joel Koh, MD Primary Cardiologist: Joel Gess MD FACP, [place], [place], [place]  HPI:  Joel Gonzales. is a 78 y.o.  mildly overweight Caucasian male, father of 1 child, who I last saw in the office 05/10/2022.  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  He is accompanied by his wife Joel Gonzales today.  He has a history of CAD status post coronary artery bypass surgery x6 in February 2001. He had a LIMA to his LAD, a RIMA to his PDA, right radial to the first obtuse marginal branch, diagonal branch, second marginal branch, and posterolateral branches. His other problems include treated hypertension, dyslipidemia, and chronic right bundle branch block. He denies chest pain or shortness of breath. Dr. Lorin Gonzales has checked his lipid profile in the past. He had a Myoview stress test performed October 31, which was entirely normal. Since I saw him year ago he's been completely asymptomatic. He had a Myoview stress test performed in the office 05/15/14 which is entirely normal as was a 2-D echocardiogram.   He was admitted to the hospital with stroke type symptoms 03/04/17 and was seen by neurology. An MRI that showed a 4 mm punctate focus involving the subcortical white matter of the left parietal lobe. A 2-D echo was normal. He is currently wearing an event monitor. He complains of being unsteady on his feet with a shuffling gait and saw a gait disorder neurologist at [facility] .   Since I saw Joel Gonzales a year ago he continues to do well from a cardiology perspective.  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  He did recently fall and hurt his right shoulder.  He denies chest pain or shortness of breath.   Current Meds  Medication Sig   atorvastatin (LIPITOR) 80 MG tablet Take 1 tablet (80 mg total) by mouth at bedtime.   carbidopa-levodopa (SINEMET IR) 25-100 MG tablet TAKE 1  TABLET BY MOUTH TWO TIMES DAILY. TAKE ON AN EMPTY STOMACH OR WITH CRACKERS IF MAKES NAUSEATED, IF HAVE SIDE EFFECTS TRY 1/2 TABLET TWICE DAILY.   clopidogrel (PLAVIX) 75 MG tablet Take 1 tablet (75 mg total) by mouth daily.   Empagliflozin-metFORMIN HCl ER (SYNJARDY XR) 25-1000 MG TB24 Take 1 tablet by mouth daily.   metoprolol succinate (TOPROL-XL) 50 MG 24 hr tablet Take 1 tablet (50 mg total) by mouth at bedtime.   pantoprazole (PROTONIX) 40 MG tablet Take 40 mg by mouth daily.   pioglitazone (ACTOS) 15 MG tablet Take 1 tablet (15 mg total) by mouth daily.   ramipril (ALTACE) 5 MG capsule Take 1 capsule (5 mg total) by mouth 2 (two) times daily.     No Known Allergies  Social History   Socioeconomic History   Marital status: Married    Spouse name: Not on file   Number of children: 1   Years of education: Not on file   Highest education level: Bachelor's degree (e.g., BA, AB, BS)  Occupational History    Comment: [employer]  Tobacco Use   Smoking status: Never   Smokeless tobacco: Never  Vaping Use   Vaping status: Never Used  Substance and Sexual Activity   Alcohol use: Not Currently    Comment: minor   Drug use: No   Sexual activity: Yes    Birth control/protection: None  Other Topics Concern   Not  on file  Social History Narrative   Lives at home with his wife   Right handed   Drinks at least 1 cup daily of caffeine   Social Drivers of Corporate investment banker Strain: Not on file  Food Insecurity: No Food Insecurity (03/03/2022)   Hunger Vital Sign    Worried About Running Out of Food in the Last Year: Never true    Ran Out of Food in the Last Year: Never true  Transportation Needs: No Transportation Needs (03/03/2022)   PRAPARE - Administrator, Civil Service (Medical): No    Lack of Transportation (Non-Medical): No  Physical Activity: Not on file  Stress: Not on file  Social Connections: Not on file  Intimate Partner Violence: Not At  Risk (03/03/2022)   Humiliation, Afraid, Rape, and Kick questionnaire    Fear of Current or Ex-Partner: No    Emotionally Abused: No    Physically Abused: No    Sexually Abused: No     Review of Systems: General: negative for chills, fever, night sweats or weight changes.  Cardiovascular: negative for chest pain, dyspnea on exertion, edema, orthopnea, palpitations, paroxysmal nocturnal dyspnea or shortness of breath Dermatological: negative for rash Respiratory: negative for cough or wheezing Urologic: negative for hematuria Abdominal: negative for nausea, vomiting, diarrhea, bright red blood per rectum, melena, or hematemesis Neurologic: negative for visual changes, syncope, or dizziness All other systems reviewed and are otherwise negative except as noted above.    Blood pressure (!) 150/72, pulse (!) 59, height 6' (1.829 m), weight 195 lb 12.8 oz (88.8 kg), SpO2 98%.  General appearance: alert and no distress Neck: no adenopathy, no carotid bruit, no JVD, supple, symmetrical, trachea midline, and thyroid not enlarged, symmetric, no tenderness/mass/nodules Lungs: clear to auscultation bilaterally Heart: regular rate and rhythm, S1, S2 normal, no murmur, click, rub or gallop Extremities: extremities normal, atraumatic, no cyanosis or edema Pulses: 2+ and symmetric Skin: Skin color, texture, turgor normal. No rashes or lesions Neurologic: Grossly normal  EKG EKG Interpretation Date/Time:  Wednesday May 16 2023 13:45:21 EST Ventricular Rate:  59 PR Interval:  176 QRS Duration:  136 QT Interval:  450 QTC Calculation: 445 R Axis:   76  Text Interpretation: Sinus bradycardia with Premature atrial complexes Right bundle branch block When compared with ECG of 17-Feb-2021 11:53, PREVIOUS ECG IS PRESENT Confirmed by Nanetta Batty [phone]) on 05/16/2023 1:47:55 PM    ASSESSMENT AND PLAN:   Essential hypertension History of essential hypertension with blood pressure measured  today at 150/72.  He is on metoprolol and ramipril.  Hyperlipidemia with target LDL less than 130 History of hyperlipidemia on high-dose statin therapy with lipid profile performed 02/14/2023 revealing total cholesterol 143, LDL 58 and HDL 51.  Hx of CABG History of CAD status post cardiac catheterization by myself in 2001 revealing left main/three-vessel disease.  He ultimately had an all arterial conduit CABG procedure by Dr. Maren Beach with a LIMA to his LAD, RIMA to the PDA and a right radial to the first marginal branch, his diagonal branch, second marginal branch and posterolateral branches.  He had a Myoview performed 05/15/2014 which was low risk and nonischemic.  He is asymptomatic.  Right bundle branch block Chronic     Joel Gess MD [facility], [facility] 05/16/2023 2:03 PM

## 2023-05-16 NOTE — Assessment & Plan Note (Signed)
Chronic.

## 2023-05-16 NOTE — Assessment & Plan Note (Signed)
History of CAD status post cardiac catheterization by myself in 2001 revealing left main/three-vessel disease.  He ultimately had an all arterial conduit CABG procedure by Dr. Maren Beach with a LIMA to his LAD, RIMA to the PDA and a right radial to the first marginal branch, his diagonal branch, second marginal branch and posterolateral branches.  He had a Myoview performed 05/15/2014 which was low risk and nonischemic.  He is asymptomatic.

## 2023-05-16 NOTE — Assessment & Plan Note (Signed)
History of hyperlipidemia on high-dose statin therapy with lipid profile performed 02/14/2023 revealing total cholesterol 143, LDL 58 and HDL 51.

## 2023-05-16 NOTE — Patient Instructions (Addendum)
Medication Instructions:  No changes.  *If you need a refill on your cardiac medications before your next appointment, please call your pharmacy*   Follow-Up: At [facility], you and your health needs are our priority.  As part of our continuing mission to provide you with exceptional heart care, we have created designated Provider Care Teams.  These Care Teams include your primary Cardiologist (physician) and Advanced Practice Providers (APPs -  Physician Assistants and Nurse Practitioners) who all work together to provide you with the care you need, when you need it.  We recommend signing up for the patient portal called "MyChart".  Sign up information is provided on this After Visit Summary.  MyChart is used to connect with patients for Virtual Visits (Telemedicine).  Patients are able to view lab/test results, encounter notes, upcoming appointments, etc.  Non-urgent messages can be sent to your provider as well.   To learn more about what you can do with MyChart, go to [email or web address].    Your next appointment:   1 year(s)  Provider:   Nanetta Batty, MD     Other Instructions

## 2023-05-30 DIAGNOSIS — D225 Melanocytic nevi of trunk: Secondary | ICD-10-CM | POA: Diagnosis not present

## 2023-05-30 DIAGNOSIS — Z85828 Personal history of other malignant neoplasm of skin: Secondary | ICD-10-CM | POA: Diagnosis not present

## 2023-05-30 DIAGNOSIS — L814 Other melanin hyperpigmentation: Secondary | ICD-10-CM | POA: Diagnosis not present

## 2023-05-30 DIAGNOSIS — D3612 Benign neoplasm of peripheral nerves and autonomic nervous system, upper limb, including shoulder: Secondary | ICD-10-CM | POA: Diagnosis not present

## 2023-05-30 DIAGNOSIS — C44729 Squamous cell carcinoma of skin of left lower limb, including hip: Secondary | ICD-10-CM | POA: Diagnosis not present

## 2023-05-30 DIAGNOSIS — D1801 Hemangioma of skin and subcutaneous tissue: Secondary | ICD-10-CM | POA: Diagnosis not present

## 2023-05-30 DIAGNOSIS — L821 Other seborrheic keratosis: Secondary | ICD-10-CM | POA: Diagnosis not present

## 2023-05-30 DIAGNOSIS — D692 Other nonthrombocytopenic purpura: Secondary | ICD-10-CM | POA: Diagnosis not present

## 2023-05-30 DIAGNOSIS — L570 Actinic keratosis: Secondary | ICD-10-CM | POA: Diagnosis not present

## 2023-06-12 ENCOUNTER — Other Ambulatory Visit: Payer: Self-pay | Admitting: *Deleted

## 2023-06-12 MED ORDER — CARBIDOPA-LEVODOPA 25-100 MG PO TABS: 1.0000 | ORAL_TABLET | Freq: Two times a day (BID) | ORAL | 1 refills | Status: DC

## 2023-06-16 ENCOUNTER — Ambulatory Visit (HOSPITAL_BASED_OUTPATIENT_CLINIC_OR_DEPARTMENT_OTHER): Admission: EM | Admit: 2023-06-16 | Discharge: 2023-06-16 | Disposition: A | Discharge status: Home / Self Care

## 2023-06-16 ENCOUNTER — Encounter (HOSPITAL_BASED_OUTPATIENT_CLINIC_OR_DEPARTMENT_OTHER): Payer: Self-pay | Admitting: Emergency Medicine

## 2023-06-16 ENCOUNTER — Other Ambulatory Visit: Payer: Self-pay

## 2023-06-16 DIAGNOSIS — S0990XA Unspecified injury of head, initial encounter: Secondary | ICD-10-CM | POA: Diagnosis not present

## 2023-06-16 DIAGNOSIS — S0101XA Laceration without foreign body of scalp, initial encounter: Secondary | ICD-10-CM | POA: Diagnosis not present

## 2023-06-16 NOTE — Discharge Instructions (Signed)
 There is no concerns on exam today. The bleeding has stopped and the laceration has closed up. Light we spoke about please watch for signs of concussion or bleeding in the brain which include changes in vision, nausea, vomiting or extreme lethargy. If this occurs you will need to go to the ER

## 2023-06-16 NOTE — ED Provider Notes (Signed)
 [name]    CSN: [id number] Arrival date & time: 06/16/23  1102      History   Chief Complaint No chief complaint on file.   HPI Joel Gonzales. is a 78 y.o. male.   78 year old male presents today with head injury.  Reporting that he was bent over trying to grab something in between the washer and dryer and fell backwards hitting posterior scalp on cabinet behind him.  Small amount of bleeding.  Denies any loss of consciousness here.  Denies any current blurred vision, dizziness, lightheadedness, nausea, vomiting or other concerning changes. 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   DM2 (diabetes mellitus, type 2) (HCC) 03/08/2022   Hip fracture requiring operative repair (HCC) 03/08/2022   Subcapital fracture of neck of femur, left, closed, initial encounter (HCC) 03/03/2022   Closed right hip fracture (HCC) 03/02/2022   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   White matter abnormality on MRI of brain 03/01/2022   Malnutrition of moderate degree 02/19/2021   Femoral fracture (HCC) 02/17/2021   Hip fracture (HCC) 02/17/2021   Acute ischemic stroke (HCC) 03/05/2017   Gait disturbance 03/05/2017   Unsteadiness    Vascular parkinsonism (HCC)    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   Essential hypertension 02/13/2013   Hyperlipidemia with target LDL less than 130 02/13/2013   Right bundle branch block 02/13/2013    Past Surgical History:  Procedure Laterality Date    CHOLECYSTECTOMY  2002   CORONARY ARTERY BYPASS GRAFT     x6 LIma to his LAD, a RIMA to his PDA, right radial to obtuse marginal branch, diagnonal, second marginal, & posteolateral branches   NOSE SURGERY  02/2013   carcinoma   TONSILLECTOMY     as a child   TOTAL HIP ARTHROPLASTY Right 02/18/2021   Procedure: TOTAL HIP ARTHROPLASTY ANTERIOR APPROACH;  Surgeon: Kathryne Hitch, MD;  Location: [facility] OR;  Service: Orthopedics;  Laterality: Right;   TOTAL HIP ARTHROPLASTY Left 03/03/2022   Procedure: TOTAL HIP ARTHROPLASTY ANTERIOR APPROACH;  Surgeon: Kathryne Hitch, MD;  Location: [facility] ORS;  Service: Orthopedics;  Laterality: Left;       Home Medications    Prior to Admission medications   Medication Sig Start Date End Date Taking? Authorizing Provider  atorvastatin (LIPITOR) 80 MG tablet Take 1 tablet (80 mg total) by mouth at bedtime. 04/07/22   Raulkar, Drema Pry, MD  carbidopa-levodopa (SINEMET IR) 25-100 MG tablet Take 1 tablet by mouth in the morning and at bedtime. TAKE 1 TABLET BY MOUTH TWO TIMES DAILY. TAKE ON AN EMPTY STOMACH OR WITH CRACKERS IF MAKES NAUSEATED, IF HAVE SIDE EFFECTS TRY 1/2 TABLET TWICE DAILY. 06/12/23   Glean Salvo, NP  clopidogrel (PLAVIX) 75 MG tablet Take 1 tablet (75 mg total) by mouth daily. 07/26/22   Raulkar, Drema Pry,  MD  Empagliflozin-metFORMIN HCl ER (SYNJARDY XR) 25-1000 MG TB24 Take 1 tablet by mouth daily. 04/07/22   Raulkar, Drema Pry, MD  metoprolol succinate (TOPROL-XL) 50 MG 24 hr tablet Take 1 tablet (50 mg total) by mouth at bedtime. 04/07/22   Raulkar, Drema Pry, MD  pantoprazole (PROTONIX) 40 MG tablet Take 40 mg by mouth daily. 04/26/22   [provider]  pioglitazone (ACTOS) 15 MG tablet Take 1 tablet (15 mg total) by mouth daily. 04/07/22   Raulkar, Drema Pry, MD  ramipril (ALTACE) 5 MG capsule Take 1 capsule (5 mg total) by mouth 2 (two) times daily. 04/07/22   Raulkar, Drema Pry, MD    Family History Family History  Problem  Relation Age of Onset   Heart attack Father 78       deceased, ASCVD   Hypertension Father    Heart disease Father    Heart Problems Mother        ASCVD   Hypertension Mother    Heart attack Mother    Heart attack Sister    Hypertension Brother    Hydrocephalus Brother     Social History Social History   Tobacco Use   Smoking status: Never   Smokeless tobacco: Never  Vaping Use   Vaping status: Never Used  Substance Use Topics   Alcohol use: Not Currently    Comment: minor   Drug use: No     Allergies   Patient has no known allergies.   Review of Systems Review of Systems  See HPI Physical Exam Triage Vital Signs ED Triage Vitals  Encounter Vitals Group     BP 06/16/23 1108 (!) 181/81     Systolic BP Percentile --      Diastolic BP Percentile --      Pulse Rate 06/16/23 1108 62     Resp 06/16/23 1108 14     Temp 06/16/23 1108 97.9 F (36.6 C)     Temp Source 06/16/23 1108 Oral     SpO2 06/16/23 1108 96 %     Weight --      Height --      Head Circumference --      Peak Flow --      Pain Score 06/16/23 1107 3     Pain Loc --      Pain Education --      Exclude from Growth Chart --    No data found.  Updated Vital Signs BP (!) 181/81 (BP Location: Right Arm)   Pulse 62   Temp 97.9 F (36.6 C) (Oral)   Resp 14   SpO2 96%   Visual Acuity Right Eye Distance:   Left Eye Distance:   Bilateral Distance:    Right Eye Near:   Left Eye Near:    Bilateral Near:     Physical Exam Vitals and nursing note reviewed.  Constitutional:      General: He is not in acute distress.    Appearance: Normal appearance. He is not ill-appearing.  HENT:     Head: Normocephalic.     Comments: Small laceration approximately half its to posterior scalp.  Bleeding controlled.  Wound edges are closed    Nose: Nose normal.  Eyes:     Extraocular Movements: Extraocular movements intact.     Conjunctiva/sclera: Conjunctivae normal.     Pupils: Pupils are equal,  round, and reactive to light.  Pulmonary:     Effort: Pulmonary effort is normal.  Musculoskeletal:  General: Normal range of motion.     Cervical back: Normal range of motion.  Skin:    General: Skin is warm and dry.  Neurological:     General: No focal deficit present.     Mental Status: He is alert. Mental status is at baseline.     Cranial Nerves: Cranial nerve deficit present.     Sensory: No sensory deficit.     Motor: No weakness.     Coordination: Coordination normal.      UC Treatments / Results  Labs (all labs ordered are listed, but only abnormal results are displayed) Labs Reviewed - No data to display  EKG   Radiology No results found.  Procedures Procedures (including critical care time)  Medications Ordered in UC Medications - No data to display  Initial Impression / Assessment and Plan / UC Course  I have reviewed the triage vital signs and the nursing notes.  Pertinent labs & imaging results that were available during my care of the patient were reviewed by me and considered in my medical decision making (see chart for details).     Head injury-no concerns on exams for any intracranial abnormalities. Small laceration to posterior scalp which is already closed and bleeding has stopped.  No need for suturing. Recommend monitor for worsening symptoms to include dizziness, headache, nausea, vomiting, blurry vision. For worsening issues will need to go to the ER Final Clinical Impressions(s) / UC Diagnoses   Final diagnoses:  Injury of head, initial encounter  Laceration of scalp, initial encounter     Discharge Instructions      There is no concerns on exam today. The bleeding has stopped and the laceration has closed up. Light we spoke about please watch for signs of concussion or bleeding in the brain which include changes in vision, nausea, vomiting or extreme lethargy. If this occurs you will need to go to the ER    ED  Prescriptions   None    PDMP not reviewed this encounter.   Janace Aris, FNP 06/16/23 1313

## 2023-06-16 NOTE — ED Triage Notes (Addendum)
 Patient states he fell around 0800 this morning and hurt his head. He has a small laceration on the back of his head. He states he takes plavix. Denies any dizziness.

## 2023-06-25 DIAGNOSIS — R062 Wheezing: Secondary | ICD-10-CM | POA: Diagnosis not present

## 2023-06-25 DIAGNOSIS — G214 Vascular parkinsonism: Secondary | ICD-10-CM | POA: Diagnosis not present

## 2023-06-25 DIAGNOSIS — J029 Acute pharyngitis, unspecified: Secondary | ICD-10-CM | POA: Diagnosis not present

## 2023-06-25 DIAGNOSIS — R0602 Shortness of breath: Secondary | ICD-10-CM | POA: Diagnosis not present

## 2023-06-25 DIAGNOSIS — N1832 Chronic kidney disease, stage 3b: Secondary | ICD-10-CM | POA: Diagnosis not present

## 2023-06-25 DIAGNOSIS — I1310 Hypertensive heart and chronic kidney disease without heart failure, with stage 1 through stage 4 chronic kidney disease, or unspecified chronic kidney disease: Secondary | ICD-10-CM | POA: Diagnosis not present

## 2023-06-25 DIAGNOSIS — J069 Acute upper respiratory infection, unspecified: Secondary | ICD-10-CM | POA: Diagnosis not present

## 2023-06-25 DIAGNOSIS — R051 Acute cough: Secondary | ICD-10-CM | POA: Diagnosis not present

## 2023-06-27 NOTE — Telephone Encounter (Signed)
 Pt said called [facility] they had not received the referral. Refax referral for physical therapy to [facility],. Phone: [phone], Fax; [phone]

## 2023-07-05 DIAGNOSIS — R062 Wheezing: Secondary | ICD-10-CM | POA: Diagnosis not present

## 2023-07-05 DIAGNOSIS — J309 Allergic rhinitis, unspecified: Secondary | ICD-10-CM | POA: Diagnosis not present

## 2023-07-05 DIAGNOSIS — R051 Acute cough: Secondary | ICD-10-CM | POA: Diagnosis not present

## 2023-07-05 DIAGNOSIS — J069 Acute upper respiratory infection, unspecified: Secondary | ICD-10-CM | POA: Diagnosis not present

## 2023-08-07 DIAGNOSIS — R2681 Unsteadiness on feet: Secondary | ICD-10-CM | POA: Diagnosis not present

## 2023-08-07 DIAGNOSIS — M6281 Muscle weakness (generalized): Secondary | ICD-10-CM | POA: Diagnosis not present

## 2023-08-07 DIAGNOSIS — R9082 White matter disease, unspecified: Secondary | ICD-10-CM | POA: Diagnosis not present

## 2023-08-07 DIAGNOSIS — R293 Abnormal posture: Secondary | ICD-10-CM | POA: Diagnosis not present

## 2023-08-07 DIAGNOSIS — G214 Vascular parkinsonism: Secondary | ICD-10-CM | POA: Diagnosis not present

## 2023-08-07 DIAGNOSIS — R278 Other lack of coordination: Secondary | ICD-10-CM | POA: Diagnosis not present

## 2023-08-07 NOTE — Telephone Encounter (Signed)
 Home health orders faxed to [facility] at [phone]

## 2023-08-09 DIAGNOSIS — R293 Abnormal posture: Secondary | ICD-10-CM | POA: Diagnosis not present

## 2023-08-09 DIAGNOSIS — R2681 Unsteadiness on feet: Secondary | ICD-10-CM | POA: Diagnosis not present

## 2023-08-09 DIAGNOSIS — R278 Other lack of coordination: Secondary | ICD-10-CM | POA: Diagnosis not present

## 2023-08-09 DIAGNOSIS — R9082 White matter disease, unspecified: Secondary | ICD-10-CM | POA: Diagnosis not present

## 2023-08-09 DIAGNOSIS — G214 Vascular parkinsonism: Secondary | ICD-10-CM | POA: Diagnosis not present

## 2023-08-09 DIAGNOSIS — M6281 Muscle weakness (generalized): Secondary | ICD-10-CM | POA: Diagnosis not present

## 2023-08-15 DIAGNOSIS — M6281 Muscle weakness (generalized): Secondary | ICD-10-CM | POA: Diagnosis not present

## 2023-08-15 DIAGNOSIS — G214 Vascular parkinsonism: Secondary | ICD-10-CM | POA: Diagnosis not present

## 2023-08-15 DIAGNOSIS — R293 Abnormal posture: Secondary | ICD-10-CM | POA: Diagnosis not present

## 2023-08-15 DIAGNOSIS — R2681 Unsteadiness on feet: Secondary | ICD-10-CM | POA: Diagnosis not present

## 2023-08-15 DIAGNOSIS — R9082 White matter disease, unspecified: Secondary | ICD-10-CM | POA: Diagnosis not present

## 2023-08-15 DIAGNOSIS — R278 Other lack of coordination: Secondary | ICD-10-CM | POA: Diagnosis not present

## 2023-08-21 DIAGNOSIS — M810 Age-related osteoporosis without current pathological fracture: Secondary | ICD-10-CM | POA: Diagnosis not present

## 2023-08-21 DIAGNOSIS — H269 Unspecified cataract: Secondary | ICD-10-CM | POA: Diagnosis not present

## 2023-08-21 DIAGNOSIS — N1832 Chronic kidney disease, stage 3b: Secondary | ICD-10-CM | POA: Diagnosis not present

## 2023-08-21 DIAGNOSIS — G214 Vascular parkinsonism: Secondary | ICD-10-CM | POA: Diagnosis not present

## 2023-08-21 DIAGNOSIS — E1165 Type 2 diabetes mellitus with hyperglycemia: Secondary | ICD-10-CM | POA: Diagnosis not present

## 2023-08-21 DIAGNOSIS — I25810 Atherosclerosis of coronary artery bypass graft(s) without angina pectoris: Secondary | ICD-10-CM | POA: Diagnosis not present

## 2023-08-21 DIAGNOSIS — I679 Cerebrovascular disease, unspecified: Secondary | ICD-10-CM | POA: Diagnosis not present

## 2023-08-21 DIAGNOSIS — E663 Overweight: Secondary | ICD-10-CM | POA: Diagnosis not present

## 2023-08-21 DIAGNOSIS — E7800 Pure hypercholesterolemia, unspecified: Secondary | ICD-10-CM | POA: Diagnosis not present

## 2023-08-21 DIAGNOSIS — I129 Hypertensive chronic kidney disease with stage 1 through stage 4 chronic kidney disease, or unspecified chronic kidney disease: Secondary | ICD-10-CM | POA: Diagnosis not present

## 2023-08-21 DIAGNOSIS — R2689 Other abnormalities of gait and mobility: Secondary | ICD-10-CM | POA: Diagnosis not present

## 2023-08-21 DIAGNOSIS — E1129 Type 2 diabetes mellitus with other diabetic kidney complication: Secondary | ICD-10-CM | POA: Diagnosis not present

## 2023-08-21 DIAGNOSIS — E113299 Type 2 diabetes mellitus with mild nonproliferative diabetic retinopathy without macular edema, unspecified eye: Secondary | ICD-10-CM | POA: Diagnosis not present

## 2023-08-22 DIAGNOSIS — R278 Other lack of coordination: Secondary | ICD-10-CM | POA: Diagnosis not present

## 2023-08-22 DIAGNOSIS — G214 Vascular parkinsonism: Secondary | ICD-10-CM | POA: Diagnosis not present

## 2023-08-22 DIAGNOSIS — R9082 White matter disease, unspecified: Secondary | ICD-10-CM | POA: Diagnosis not present

## 2023-08-22 DIAGNOSIS — R293 Abnormal posture: Secondary | ICD-10-CM | POA: Diagnosis not present

## 2023-08-22 DIAGNOSIS — R2681 Unsteadiness on feet: Secondary | ICD-10-CM | POA: Diagnosis not present

## 2023-08-22 DIAGNOSIS — M6281 Muscle weakness (generalized): Secondary | ICD-10-CM | POA: Diagnosis not present

## 2023-09-17 DIAGNOSIS — M25651 Stiffness of right hip, not elsewhere classified: Secondary | ICD-10-CM | POA: Diagnosis not present

## 2023-09-17 DIAGNOSIS — M25661 Stiffness of right knee, not elsewhere classified: Secondary | ICD-10-CM | POA: Diagnosis not present

## 2023-09-17 DIAGNOSIS — R293 Abnormal posture: Secondary | ICD-10-CM | POA: Diagnosis not present

## 2023-09-17 DIAGNOSIS — M25652 Stiffness of left hip, not elsewhere classified: Secondary | ICD-10-CM | POA: Diagnosis not present

## 2023-09-17 DIAGNOSIS — R2681 Unsteadiness on feet: Secondary | ICD-10-CM | POA: Diagnosis not present

## 2023-09-17 DIAGNOSIS — R278 Other lack of coordination: Secondary | ICD-10-CM | POA: Diagnosis not present

## 2023-09-17 DIAGNOSIS — R9082 White matter disease, unspecified: Secondary | ICD-10-CM | POA: Diagnosis not present

## 2023-09-17 DIAGNOSIS — G214 Vascular parkinsonism: Secondary | ICD-10-CM | POA: Diagnosis not present

## 2023-09-17 DIAGNOSIS — M25662 Stiffness of left knee, not elsewhere classified: Secondary | ICD-10-CM | POA: Diagnosis not present

## 2023-09-17 DIAGNOSIS — M6281 Muscle weakness (generalized): Secondary | ICD-10-CM | POA: Diagnosis not present

## 2023-09-25 DIAGNOSIS — R293 Abnormal posture: Secondary | ICD-10-CM | POA: Diagnosis not present

## 2023-09-25 DIAGNOSIS — R2681 Unsteadiness on feet: Secondary | ICD-10-CM | POA: Diagnosis not present

## 2023-09-25 DIAGNOSIS — R278 Other lack of coordination: Secondary | ICD-10-CM | POA: Diagnosis not present

## 2023-09-25 DIAGNOSIS — M6281 Muscle weakness (generalized): Secondary | ICD-10-CM | POA: Diagnosis not present

## 2023-09-25 DIAGNOSIS — R9082 White matter disease, unspecified: Secondary | ICD-10-CM | POA: Diagnosis not present

## 2023-09-25 DIAGNOSIS — G214 Vascular parkinsonism: Secondary | ICD-10-CM | POA: Diagnosis not present

## 2023-10-03 NOTE — Telephone Encounter (Signed)
 Home health orders faxed to [facility] at [phone]

## 2023-10-09 DIAGNOSIS — M25651 Stiffness of right hip, not elsewhere classified: Secondary | ICD-10-CM | POA: Diagnosis not present

## 2023-10-09 DIAGNOSIS — R278 Other lack of coordination: Secondary | ICD-10-CM | POA: Diagnosis not present

## 2023-10-09 DIAGNOSIS — M25662 Stiffness of left knee, not elsewhere classified: Secondary | ICD-10-CM | POA: Diagnosis not present

## 2023-10-09 DIAGNOSIS — M6281 Muscle weakness (generalized): Secondary | ICD-10-CM | POA: Diagnosis not present

## 2023-10-09 DIAGNOSIS — M25652 Stiffness of left hip, not elsewhere classified: Secondary | ICD-10-CM | POA: Diagnosis not present

## 2023-10-09 DIAGNOSIS — R293 Abnormal posture: Secondary | ICD-10-CM | POA: Diagnosis not present

## 2023-10-09 DIAGNOSIS — G214 Vascular parkinsonism: Secondary | ICD-10-CM | POA: Diagnosis not present

## 2023-10-09 DIAGNOSIS — M25661 Stiffness of right knee, not elsewhere classified: Secondary | ICD-10-CM | POA: Diagnosis not present

## 2023-10-09 DIAGNOSIS — R2681 Unsteadiness on feet: Secondary | ICD-10-CM | POA: Diagnosis not present

## 2023-10-09 DIAGNOSIS — R9082 White matter disease, unspecified: Secondary | ICD-10-CM | POA: Diagnosis not present

## 2023-10-16 DIAGNOSIS — R278 Other lack of coordination: Secondary | ICD-10-CM | POA: Diagnosis not present

## 2023-10-16 DIAGNOSIS — R2681 Unsteadiness on feet: Secondary | ICD-10-CM | POA: Diagnosis not present

## 2023-10-16 DIAGNOSIS — M6281 Muscle weakness (generalized): Secondary | ICD-10-CM | POA: Diagnosis not present

## 2023-10-16 DIAGNOSIS — R9082 White matter disease, unspecified: Secondary | ICD-10-CM | POA: Diagnosis not present

## 2023-10-16 DIAGNOSIS — G214 Vascular parkinsonism: Secondary | ICD-10-CM | POA: Diagnosis not present

## 2023-10-16 DIAGNOSIS — R293 Abnormal posture: Secondary | ICD-10-CM | POA: Diagnosis not present

## 2023-10-24 DIAGNOSIS — R9082 White matter disease, unspecified: Secondary | ICD-10-CM | POA: Diagnosis not present

## 2023-10-24 DIAGNOSIS — G214 Vascular parkinsonism: Secondary | ICD-10-CM | POA: Diagnosis not present

## 2023-10-24 DIAGNOSIS — R2681 Unsteadiness on feet: Secondary | ICD-10-CM | POA: Diagnosis not present

## 2023-10-24 DIAGNOSIS — R293 Abnormal posture: Secondary | ICD-10-CM | POA: Diagnosis not present

## 2023-10-24 DIAGNOSIS — M6281 Muscle weakness (generalized): Secondary | ICD-10-CM | POA: Diagnosis not present

## 2023-10-24 DIAGNOSIS — R278 Other lack of coordination: Secondary | ICD-10-CM | POA: Diagnosis not present

## 2023-10-30 ENCOUNTER — Ambulatory Visit (INDEPENDENT_AMBULATORY_CARE_PROVIDER_SITE_OTHER): Payer: Medicare Other | Admitting: Neurology

## 2023-10-30 ENCOUNTER — Encounter: Payer: Self-pay | Admitting: Neurology

## 2023-10-30 VITALS — BP 140/74 | HR 58 | Ht 72.0 in | Wt 197.0 lb

## 2023-10-30 DIAGNOSIS — G214 Vascular parkinsonism: Secondary | ICD-10-CM | POA: Diagnosis not present

## 2023-10-30 NOTE — Progress Notes (Signed)
 Patient: Joel Gonzales. Date of Birth: 15-Jul-1945  Reason for Visit: Follow up History from: Patient, wife  Primary Neurologist: Ines    HISTORY OF PRESENT ILLNESS:  10/30/2023:78 year old male with a history of coronary artery disease status post CABG, diabetes, hypertension, hyperlipidemia here for follow-up gait abnormality, moderately advanced white-matter changges, gait abnormality, diagnosed with vascular parkinsonism 4 years ago at [facility], worsening gait and falls.  Diagnosed with vascular parkinsonism at [facility] movement disorders center. No falls in the last 6 months. 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 He is on Sinemet  low dose. 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 Sinemet  still helping, no falls. A year ago he broke his hip on the left after speeding up and falling this was a year after the right hip, he has been going back to his exercise machines and his balance is better and he stayingoff of the treadmill. They had a great rehab person in the house, apping toes, but he still have balance problems and parkinsonism. They loved a PT and happy to refer they have her name she was home PT and happy to prescribe if they ever like.Lovely couple, funny wife who provides much information. Mood is good, the whole being is better than it was, no swallowing difficulty, no prob bowel or bladder,    Today 05/01/2023: Here with his wife, mentions concern about our last visit that I questioned his memory, I didn't remember him coming with his wife who is also my patient. Dec 2023 wife called about memory/anger issues, Dr. Ines referred for neuropsych, I placed the order again in July 2024. Was not completed. They claim never memory issues, was more anger issues, frustration. Has mobility issues, from 2 broken hips. Still involved in landscape nursery business, is active, just  got back from 2 weeks trip to [place], he drove. He has poor balance. Around 4 PM he gets fatigued, after working all day. His upper legs are weak. Takes Sinemet  25/100 mg BID in the AM/PM at bedtime. He had a fall back in October, injured right shoulder rotator cuff. Is enrolled in cardiac rehab in [place], but doesn't go. Denies any issues with his memory.  10/17/22 SS: Last visit with Dr. Ines 03/01/22.  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  2 negative DAT scans.  Multiple phone calls about memory change.  He was referred to neuropsych in December.  November 2023 he broke his left hip. Wife reports some concern with him getting agitated, frustrated easily. 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 He thinks his walking is good, at times catches himself speeding up. Has gone through PT. Takes Sinemet  25/100 mg twice daily, thinks makes his walking steady. He doesn't think much memory trouble, goes to cardiac rehab 3 days a week. He drives, no accidents. No other significant falls, some stumbles. He thinks doing well. MOCA 23/30.  HISTORY  Dr. Ines February 28, 2022: 78 year old male with a history of coronary artery disease status post CABG, diabetes, hypertension, hyperlipidemia here for follow-up gait abnormality, moderately advanced white-matter changges, gait abnormality, diagnosed with vascular parkinsonism 4 years ago at [facility], worsening gait and falls.  Diagnosed with vascular parkinsonism at [facility] movement disorders center.   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  He had a workup in the past  including DaTscan  which was negative, repeat DaTscan  August 2023 was also negative confirming that this is unlikely in the Parkinson's disease family but is likely due to vascular gait apraxia.  Repeat MRI of the brain again showed moderate chronic small vessel ischemic disease and chronic cerebral  microhemorrhages.  Minimal progression of above finding since 2018.  Could repeat MRI of the cervical spine, he declined a sleep study at last appointment.   Already set up for PT at [facility]. Discussed findings, reviewed images, discussed vascular parkinsonism (NOT parkinson's disease), gave literature. Manage medically at this point, watch risk factors we discussed. He starts walking and can;t stop. We discussed sinemet .    MRI brain: 11/08/2021: 11/09/2021: DAT SCAN IMPRESSION: Ioflupane scan within normal limits. No reduced radiotracer activity in basal ganglia to suggest Parkinson's syndrome pathology.   Of note, DaTSCAN  is not diagnostic of Parkinsonian syndromes, which remains a clinical diagnosis. DaTscan  is an adjuvant test to aid in the clinical diagnosis of Parkinsonian syndromes.    IMPRESSION: MRI brain 11/08/2021   MRI brain (with and without) demonstrating: -Moderate chronic small vessel ischemic disease. -Multiple supratentorial and infratentorial chronic cerebral microhemorrhages. -No acute findings.  Minimal progression of above findings since 2018.     REVIEW OF SYSTEMS: Out of a complete 14 system review of symptoms, the patient complains only of the following symptoms, and all other reviewed systems are negative.  See HPI  ALLERGIES: Allergies  Allergen Reactions   Other Shortness Of Breath    Plants and trees - SOB, wheezing    HOME MEDICATIONS: Outpatient Medications Prior to Visit  Medication Sig Dispense Refill   atorvastatin  (LIPITOR ) 80 MG tablet Take 1 tablet (80 mg total) by mouth at bedtime. 90 tablet 3   carbidopa -levodopa  (SINEMET  IR) 25-100 MG tablet Take 1 tablet by mouth in the morning and at bedtime. TAKE 1 TABLET BY MOUTH TWO TIMES DAILY. TAKE ON AN EMPTY STOMACH OR WITH CRACKERS IF MAKES NAUSEATED, IF HAVE SIDE EFFECTS TRY 1/2 TABLET TWICE DAILY. 180 tablet 1   clopidogrel  (PLAVIX ) 75 MG tablet Take 1 tablet (75 mg total) by mouth daily.  30 tablet 0   Empagliflozin -metFORMIN  HCl ER (SYNJARDY  XR) 25-1000 MG TB24 Take 1 tablet by mouth daily. 30 tablet 3   metoprolol  succinate (TOPROL -XL) 50 MG 24 hr tablet Take 1 tablet (50 mg total) by mouth at bedtime. 90 tablet 2   pantoprazole  (PROTONIX ) 40 MG tablet Take 40 mg by mouth daily.     pioglitazone  (ACTOS ) 15 MG tablet Take 1 tablet (15 mg total) by mouth daily. 90 tablet 3   ramipril  (ALTACE ) 5 MG capsule Take 1 capsule (5 mg total) by mouth 2 (two) times daily. 180 capsule 2   No facility-administered medications prior to visit.    PAST MEDICAL HISTORY: Past Medical History:  Diagnosis Date   CAD (coronary artery disease)    Diabetes mellitus without complication (HCC)    Type 2   Family history of ASCVD    History of stress test 01/2012   which was entirely normal   Hx of CABG    x6 LIma to his LAD, a RIMA to his PDA, right radial to obtuse marginal branch, diagnonal, second marginal, & posteolateral branches   Hyperlipemia    Hypertension    RBBB    chronic    PAST SURGICAL HISTORY: Past Surgical History:  Procedure Laterality Date   CHOLECYSTECTOMY  2002   CORONARY ARTERY BYPASS GRAFT  x6 LIma to his LAD, a RIMA to his PDA, right radial to obtuse marginal branch, diagnonal, second marginal, & posteolateral branches   NOSE SURGERY  02/2013   carcinoma   TONSILLECTOMY     as a child   TOTAL HIP ARTHROPLASTY Right 02/18/2021   Procedure: TOTAL HIP ARTHROPLASTY ANTERIOR APPROACH;  Surgeon: Vernetta Lonni GRADE, MD;  Location: [facility] OR;  Service: Orthopedics;  Laterality: Right;   TOTAL HIP ARTHROPLASTY Left 03/03/2022   Procedure: TOTAL HIP ARTHROPLASTY ANTERIOR APPROACH;  Surgeon: Vernetta Lonni GRADE, MD;  Location: [facility] ORS;  Service: Orthopedics;  Laterality: Left;    FAMILY HISTORY: Family History  Problem Relation Age of Onset   Heart attack Father 33       deceased, ASCVD   Hypertension Father    Heart disease Father    Heart Problems Mother         ASCVD   Hypertension Mother    Heart attack Mother    Heart attack Sister    Hypertension Brother    Hydrocephalus Brother     SOCIAL HISTORY: Social History   Socioeconomic History   Marital status: Married    Spouse name: Not on file   Number of children: 1   Years of education: Not on file   Highest education level: Bachelor's degree (e.g., BA, AB, BS)  Occupational History    Comment: [employer]  Tobacco Use   Smoking status: Never   Smokeless tobacco: Never  Vaping Use   Vaping status: Never Used  Substance and Sexual Activity   Alcohol  use: Not Currently    Comment: minor   Drug use: No   Sexual activity: Yes    Birth control/protection: None  Other Topics Concern   Not on file  Social History Narrative   Lives at home with his wife   Right handed   Drinks at least 1 cup daily of soda   Social Drivers of Corporate investment banker Strain: Not on file  Food Insecurity: No Food Insecurity (03/03/2022)   Hunger Vital Sign    Worried About Running Out of Food in the Last Year: Never true    Ran Out of Food in the Last Year: Never true  Transportation Needs: No Transportation Needs (03/03/2022)   PRAPARE - Administrator, Civil Service (Medical): No    Lack of Transportation (Non-Medical): No  Physical Activity: Not on file  Stress: Not on file  Social Connections: Not on file  Intimate Partner Violence: Not At Risk (03/03/2022)   Humiliation, Afraid, Rape, and Kick questionnaire    Fear of Current or Ex-Partner: No    Emotionally Abused: No    Physically Abused: No    Sexually Abused: No   PHYSICAL EXAM  Vitals:   10/30/23 1302  BP: (!) 140/74  Pulse: (!) 58  Weight: 197 lb (89.4 kg)  Height: 6' (1.829 m)     Body mass index is 26.72 kg/m.   Physical exam: Exam: Gen: NAD, conversant      CV: No palpitations or chest pain or SOB. VS: Breathing at a normal rate. Weight appears within normal limits. Not  febrile. Eyes: Conjunctivae clear without exudates or hemorrhage  Neuro: Detailed Neurologic Exam  Speech:    Speech is normal; fluent and spontaneous with normal comprehension.  Cognition:    The patient is oriented to person, place, and time;    Cranial Nerves:    The pupils are equal, round, and  reactive to light. Visual fields are full Extraocular movements are intact.  The face is symmetric with normal sensation. The palate elevates in the midline. Hearing intact. Voice is normal. Shoulder shrug is normal. The tongue has normal motion without fasciculations.   Coordination: Normal, no dysmetria or ataxia  Gait: Slightly stooped and low clearance with decreased arm swing  Motor Observation:   no involuntary movements noted., no tremor Tone:    normal    Strength:    Strength is anti-gravity and symmetric in the upper and lower limbs.      Sensation: intact to LT, no reports of numbness or tingling or paresthesias        DIAGNOSTIC DATA (LABS, IMAGING, TESTING) - I reviewed patient records, labs, notes, testing and imaging myself where available.  Lab Results  Component Value Date   WBC 10.4 03/12/2022   HGB 9.0 (L) 03/12/2022   HCT 26.8 (L) 03/12/2022   MCV 95.0 03/12/2022   PLT 406 (H) 03/12/2022      Component Value Date/Time   NA 137 03/13/2022 0556   NA 145 (H) 10/18/2021 1137   K 3.8 03/13/2022 0556   CL 104 03/13/2022 0556   CO2 25 03/13/2022 0556   GLUCOSE 113 (H) 03/13/2022 0556   BUN 18 03/13/2022 0556   BUN 22 10/18/2021 1137   CREATININE 1.22 03/13/2022 0556   CALCIUM  8.8 (L) 03/13/2022 0556   PROT 6.2 (L) 03/09/2022 0619   PROT 7.8 04/27/2020 1122   ALBUMIN 3.0 (L) 03/09/2022 0619   ALBUMIN 4.8 (H) 04/27/2020 1122   AST 28 03/09/2022 0619   ALT 28 03/09/2022 0619   ALKPHOS 68 03/09/2022 0619   BILITOT 1.1 03/09/2022 0619   BILITOT 0.6 04/27/2020 1122   GFRNONAA >60 03/13/2022 0556   GFRAA >60 03/04/2017 1823   Lab Results  Component  Value Date   CHOL 167 04/27/2020   HDL 46 04/27/2020   LDLCALC 90 04/27/2020   TRIG 179 (H) 04/27/2020   CHOLHDL 3.6 04/27/2020   Lab Results  Component Value Date   HGBA1C 6.9 (H) 03/02/2022   No results found for: VITAMINB12 Lab Results  Component Value Date   TSH 2.254 03/04/2017     ASSESSMENT AND PLAN 78 y.o. year old male Diagnosed with vascular parkinsonism at [facility] movement disorders center.  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  Repeat MRI of the brain again showed moderate chronic small vessel ischemic disease and chronic cerebral microhemorrhages. Minimal progression of above finding since 2018.  Could repeat MRI of the cervical spine but cervical spine was neg in 2018 and has been having symptoms since then, he declined a sleep study. He denies any memory issues, was more frustration issue.  He still have balance problems and parkinsonism. They loved a PT and happy to refer they have her name she was home PT and happy to prescribe if they ever like. She will give us  the number. Fatigue, weakness in the late afternoon, try moving Sinemet  to early afternoon, keep AM dosing, can even try taking 25/100 mg 1 tablet TID if benefit to strength, stamina.  However unclear benefit since not true Parkinson's disease. We discussed pursuing sleep consult, he is not interested at this time  Close follow-up with PCP for management of vascular risk factors with history of  lacunar stroke, BP less than 130/90, A1c less than 7.0, LDL less than 70.  He will follow-up in 6 months or sooner if needed with Dr. Ines Onetha Ines, MD [facility] [address] [place], [place] [address] [phone]  I spent 38 minutes of face-to-face and non-face-to-face time with patient on the   1. Vascular parkinsonism (HCC)    diagnosis.  This included previsit chart review, lab review, study review, order entry, electronic health record documentation, patient education on the different diagnostic and therapeutic options, counseling and coordination of care, risks and benefits of management, compliance, or risk factor reduction

## 2023-11-06 DIAGNOSIS — M25662 Stiffness of left knee, not elsewhere classified: Secondary | ICD-10-CM | POA: Diagnosis not present

## 2023-11-06 DIAGNOSIS — M6281 Muscle weakness (generalized): Secondary | ICD-10-CM | POA: Diagnosis not present

## 2023-11-06 DIAGNOSIS — M25651 Stiffness of right hip, not elsewhere classified: Secondary | ICD-10-CM | POA: Diagnosis not present

## 2023-11-06 DIAGNOSIS — G214 Vascular parkinsonism: Secondary | ICD-10-CM | POA: Diagnosis not present

## 2023-11-06 DIAGNOSIS — R2681 Unsteadiness on feet: Secondary | ICD-10-CM | POA: Diagnosis not present

## 2023-11-06 DIAGNOSIS — M25661 Stiffness of right knee, not elsewhere classified: Secondary | ICD-10-CM | POA: Diagnosis not present

## 2023-11-06 DIAGNOSIS — R9082 White matter disease, unspecified: Secondary | ICD-10-CM | POA: Diagnosis not present

## 2023-11-06 DIAGNOSIS — R278 Other lack of coordination: Secondary | ICD-10-CM | POA: Diagnosis not present

## 2023-11-06 DIAGNOSIS — M25652 Stiffness of left hip, not elsewhere classified: Secondary | ICD-10-CM | POA: Diagnosis not present

## 2023-11-06 DIAGNOSIS — R293 Abnormal posture: Secondary | ICD-10-CM | POA: Diagnosis not present

## 2023-12-31 ENCOUNTER — Ambulatory Visit (INDEPENDENT_AMBULATORY_CARE_PROVIDER_SITE_OTHER): Admitting: Family Medicine

## 2023-12-31 ENCOUNTER — Encounter (HOSPITAL_BASED_OUTPATIENT_CLINIC_OR_DEPARTMENT_OTHER): Payer: Self-pay | Admitting: Family Medicine

## 2023-12-31 VITALS — BP 145/81 | HR 60 | Temp 97.4°F | Resp 16 | Ht 70.87 in | Wt 195.0 lb

## 2023-12-31 DIAGNOSIS — E113299 Type 2 diabetes mellitus with mild nonproliferative diabetic retinopathy without macular edema, unspecified eye: Secondary | ICD-10-CM | POA: Insufficient documentation

## 2023-12-31 DIAGNOSIS — L309 Dermatitis, unspecified: Secondary | ICD-10-CM | POA: Diagnosis not present

## 2023-12-31 DIAGNOSIS — G214 Vascular parkinsonism: Secondary | ICD-10-CM

## 2023-12-31 DIAGNOSIS — I10 Essential (primary) hypertension: Secondary | ICD-10-CM | POA: Diagnosis not present

## 2023-12-31 DIAGNOSIS — R809 Proteinuria, unspecified: Secondary | ICD-10-CM | POA: Diagnosis not present

## 2023-12-31 DIAGNOSIS — R5383 Other fatigue: Secondary | ICD-10-CM

## 2023-12-31 DIAGNOSIS — M810 Age-related osteoporosis without current pathological fracture: Secondary | ICD-10-CM | POA: Insufficient documentation

## 2023-12-31 DIAGNOSIS — N1831 Chronic kidney disease, stage 3a: Secondary | ICD-10-CM

## 2023-12-31 DIAGNOSIS — E785 Hyperlipidemia, unspecified: Secondary | ICD-10-CM | POA: Diagnosis not present

## 2023-12-31 DIAGNOSIS — R4189 Other symptoms and signs involving cognitive functions and awareness: Secondary | ICD-10-CM | POA: Insufficient documentation

## 2023-12-31 DIAGNOSIS — E1129 Type 2 diabetes mellitus with other diabetic kidney complication: Secondary | ICD-10-CM | POA: Diagnosis not present

## 2023-12-31 DIAGNOSIS — S72009S Fracture of unspecified part of neck of unspecified femur, sequela: Secondary | ICD-10-CM | POA: Insufficient documentation

## 2023-12-31 DIAGNOSIS — G20A1 Parkinson's disease without dyskinesia, without mention of fluctuations: Secondary | ICD-10-CM | POA: Insufficient documentation

## 2023-12-31 MED ORDER — TRIAMCINOLONE ACETONIDE 0.1 % EX CREA: 1.0000 | TOPICAL_CREAM | Freq: Two times a day (BID) | CUTANEOUS | 2 refills | Status: DC

## 2023-12-31 NOTE — Assessment & Plan Note (Signed)
 NSAID  avoidance.  Reevaluate today.

## 2023-12-31 NOTE — Assessment & Plan Note (Signed)
 Stable    DASH diet

## 2023-12-31 NOTE — Assessment & Plan Note (Signed)
 Reevaluate his A1c and f/u with him soon.

## 2023-12-31 NOTE — Progress Notes (Signed)
 New Patient Office Visit  Subjective    Patient ID: Joel Gonzales., male    DOB: 1945/11/04  Age: 78 y.o. MRN: [id number]  CC:  Chief Complaint  Patient presents with   Establish Care    Establish care     HPI Field Staniszewski. presents to establish care F/u as above.  New to my practice.  Generally doing well with few concerns.  Also reports a somewhat itchy rash along his left neck for the last 6 weeks or so.  Extended discussion about his PMH.  Outpatient Encounter Medications as of 12/31/2023  Medication Sig   atorvastatin  (LIPITOR ) 80 MG tablet Take 1 tablet (80 mg total) by mouth at bedtime.   carbidopa -levodopa  (SINEMET  IR) 25-100 MG tablet Take 1 tablet by mouth in the morning and at bedtime. TAKE 1 TABLET BY MOUTH TWO TIMES DAILY. TAKE ON AN EMPTY STOMACH OR WITH CRACKERS IF MAKES NAUSEATED, IF HAVE SIDE EFFECTS TRY 1/2 TABLET TWICE DAILY.   clopidogrel  (PLAVIX ) 75 MG tablet Take 1 tablet (75 mg total) by mouth daily.   Empagliflozin -metFORMIN  HCl ER (SYNJARDY  XR) 25-1000 MG TB24 Take 1 tablet by mouth daily.   Empagliflozin -metFORMIN  HCl ER (SYNJARDY  XR) 25-1000 MG TB24 Take by mouth.   metoprolol  succinate (TOPROL -XL) 50 MG 24 hr tablet Take 1 tablet (50 mg total) by mouth at bedtime.   pantoprazole  (PROTONIX ) 40 MG tablet Take 40 mg by mouth daily.   pioglitazone  (ACTOS ) 15 MG tablet Take 1 tablet (15 mg total) by mouth daily.   ramipril  (ALTACE ) 5 MG capsule Take 1 capsule (5 mg total) by mouth 2 (two) times daily.   triamcinolone cream (KENALOG) 0.1 % Apply 1 Application topically 2 (two) times daily. Use prn itching and irritation   No facility-administered encounter medications on file as of 12/31/2023.    Past Medical History:  Diagnosis Date   CAD (coronary artery disease)    f/by [facility]   Diabetes mellitus without complication (HCC)    Type 2   GERD (gastroesophageal reflux disease)    History of stroke    Hyperlipemia    Hypertension     Osteoarthritis    RBBB    chronic   Sleep disorder    Study declined   Vascular parkinsonism (HCC)    with secondary ataxia f/by Dr. Ines    Past Surgical History:  Procedure Laterality Date   CHOLECYSTECTOMY  04/03/2000   COLONOSCOPY N/A    in distant past, with repeat declined   CORONARY ARTERY BYPASS GRAFT     x6 LIma to his LAD, a RIMA to his PDA, right radial to obtuse marginal branch, diagnonal, second marginal, & posteolateral branches   NOSE SURGERY  02/01/2013   carcinoma   TONSILLECTOMY     as a child   TOTAL HIP ARTHROPLASTY Right 02/18/2021   Procedure: TOTAL HIP ARTHROPLASTY ANTERIOR APPROACH;  Surgeon: Vernetta Lonni GRADE, MD;  Location: [facility] OR;  Service: Orthopedics;  Laterality: Right;   TOTAL HIP ARTHROPLASTY Left 03/03/2022   Procedure: TOTAL HIP ARTHROPLASTY ANTERIOR APPROACH;  Surgeon: Vernetta Lonni GRADE, MD;  Location: [facility];  Service: Orthopedics;  Laterality: Left;    Family History  Problem Relation Age of Onset   Heart attack Father 80       deceased, ASCVD   Hypertension Father    Heart disease Father    Heart Problems Mother        ASCVD   Hypertension Mother  Heart attack Mother    Heart attack Sister    Hypertension Brother    Hydrocephalus Brother     Social History   Tobacco Use   Smoking status: Never   Smokeless tobacco: Never  Vaping Use   Vaping status: Never Used  Substance Use Topics   Alcohol  use: Not Currently    Comment: minor   Drug use: No    Review of Systems  Constitutional:  Negative for diaphoresis, fever, malaise/fatigue and weight loss.  Respiratory:  Negative for cough, shortness of breath and wheezing.   Cardiovascular:  Negative for chest pain, palpitations, orthopnea, claudication, leg swelling and PND.        Objective    BP (!) 145/81 (BP Location: Right Arm, Patient Position: Standing)   Pulse 60   Temp (!) 97.4 F (36.3 C) (Oral)   Resp 16   Ht 5' 10.87 (1.8 m)   Wt 195 lb (88.5  kg)   SpO2 95%   BMI 27.30 kg/m   Physical Exam Constitutional:      General: He is not in acute distress.    Appearance: Normal appearance.  HENT:     Head: Normocephalic.  Cardiovascular:     Rate and Rhythm: Normal rate and regular rhythm.     Pulses: Normal pulses.     Heart sounds: Normal heart sounds.  Pulmonary:     Effort: Pulmonary effort is normal.     Breath sounds: Normal breath sounds.  Abdominal:     General: Bowel sounds are normal.     Palpations: Abdomen is soft.  Musculoskeletal:     Cervical back: Neck supple. No tenderness.     Right lower leg: No edema.     Left lower leg: No edema.  Skin:    Comments: Left neck appears normal, with no visible rash.  Neurological:     Mental Status: He is alert.         Assessment & Plan:  Vascular parkinsonism [facility]) Assessment & Plan: Stable.  F/u with Dr. Ines as directed.  Ongoing fall precautions.  Known to PT.  Safety proof home.   Stage 3a chronic kidney disease (HCC) Assessment & Plan: NSAID  avoidance.  Reevaluate today.   Essential hypertension Assessment & Plan: Stable.  DASH diet.  Orders: -     CBC with Differential/Platelet -     Comprehensive metabolic panel with GFR  Dyslipidemia -     Lipid panel  Eczema, unspecified type -     Triamcinolone Acetonide; Apply 1 Application topically 2 (two) times daily. Use prn itching and irritation  Dispense: 30 g; Refill: 2  Diabetes mellitus with microalbuminuria (HCC) Assessment & Plan: Reevaluate his A1c and f/u with him soon.  Orders: -     Hemoglobin A1c  Fatigue, unspecified type -     Vitamin B12    Return in about 4 weeks (around 01/28/2024) for chronic follow-up.   REDDING PONCE NORLEEN FALCON., MD

## 2023-12-31 NOTE — Assessment & Plan Note (Signed)
 Stable.  F/u with Dr. Ines as directed.  Ongoing fall precautions.  Known to PT.  Safety proof home.

## 2024-01-03 ENCOUNTER — Other Ambulatory Visit: Payer: Self-pay | Admitting: Neurology

## 2024-01-03 ENCOUNTER — Ambulatory Visit (HOSPITAL_BASED_OUTPATIENT_CLINIC_OR_DEPARTMENT_OTHER): Payer: Self-pay | Admitting: Family Medicine

## 2024-01-03 LAB — COMPREHENSIVE METABOLIC PANEL WITH GFR
ALT: 10 IU/L (ref 0–44)
AST: 25 IU/L (ref 0–40)
Albumin: 4.6 g/dL (ref 3.8–4.8)
Alkaline Phosphatase: 109 IU/L (ref 47–123)
BUN/Creatinine Ratio: 16 (ref 10–24)
BUN: 21 mg/dL (ref 8–27)
Bilirubin Total: 0.8 mg/dL (ref 0.0–1.2)
CO2: 17 mmol/L — ABNORMAL LOW (ref 20–29)
Calcium: 9.6 mg/dL (ref 8.6–10.2)
Chloride: 100 mmol/L (ref 96–106)
Creatinine, Ser: 1.33 mg/dL — ABNORMAL HIGH (ref 0.76–1.27)
Globulin, Total: 2.9 g/dL (ref 1.5–4.5)
Glucose: 118 mg/dL — ABNORMAL HIGH (ref 70–99)
Potassium: 4.4 mmol/L (ref 3.5–5.2)
Sodium: 140 mmol/L (ref 134–144)
Total Protein: 7.5 g/dL (ref 6.0–8.5)
eGFR: 54 mL/min/1.73 — ABNORMAL LOW (ref >59)

## 2024-01-03 LAB — LIPID PANEL
Chol/HDL Ratio: 2.9 ratio (ref 0.0–5.0)
Cholesterol, Total: 133 mg/dL (ref 100–199)
HDL: 46 mg/dL (ref >39)
LDL Chol Calc (NIH): 64 mg/dL (ref 0–99)
Triglycerides: 130 mg/dL (ref 0–149)
VLDL Cholesterol Cal: 23 mg/dL (ref 5–40)

## 2024-01-03 LAB — CBC WITH DIFFERENTIAL/PLATELET
Basophils Absolute: 0.1 x10E3/uL (ref 0.0–0.2)
Basos: 1 %
EOS (ABSOLUTE): 0.3 x10E3/uL (ref 0.0–0.4)
Eos: 4 %
Hematocrit: 43.3 % (ref 37.5–51.0)
Hemoglobin: 14.3 g/dL (ref 13.0–17.7)
Immature Grans (Abs): 0 x10E3/uL (ref 0.0–0.1)
Immature Granulocytes: 0 %
Lymphocytes Absolute: 1.4 x10E3/uL (ref 0.7–3.1)
Lymphs: 19 %
MCH: 31.7 pg (ref 26.6–33.0)
MCHC: 33 g/dL (ref 31.5–35.7)
MCV: 96 fL (ref 79–97)
Monocytes Absolute: 0.6 x10E3/uL (ref 0.1–0.9)
Monocytes: 8 %
Neutrophils Absolute: 5.2 x10E3/uL (ref 1.4–7.0)
Neutrophils: 68 %
Platelets: 243 x10E3/uL (ref 150–450)
RBC: 4.51 x10E6/uL (ref 4.14–5.80)
RDW: 13.2 % (ref 11.6–15.4)
WBC: 7.5 x10E3/uL (ref 3.4–10.8)

## 2024-01-03 LAB — HEMOGLOBIN A1C
Est. average glucose Bld gHb Est-mCnc: 160 mg/dL
Hgb A1c MFr Bld: 7.2 % — ABNORMAL HIGH (ref 4.8–5.6)

## 2024-01-03 LAB — VITAMIN B12: Vitamin B-12: 352 pg/mL (ref 232–1245)

## 2024-01-07 DIAGNOSIS — C4441 Basal cell carcinoma of skin of scalp and neck: Secondary | ICD-10-CM | POA: Diagnosis not present

## 2024-01-07 DIAGNOSIS — B078 Other viral warts: Secondary | ICD-10-CM | POA: Diagnosis not present

## 2024-01-07 DIAGNOSIS — Z85828 Personal history of other malignant neoplasm of skin: Secondary | ICD-10-CM | POA: Diagnosis not present

## 2024-01-23 DIAGNOSIS — E119 Type 2 diabetes mellitus without complications: Secondary | ICD-10-CM | POA: Diagnosis not present

## 2024-01-23 DIAGNOSIS — H524 Presbyopia: Secondary | ICD-10-CM | POA: Diagnosis not present

## 2024-01-23 DIAGNOSIS — H2513 Age-related nuclear cataract, bilateral: Secondary | ICD-10-CM | POA: Diagnosis not present

## 2024-01-23 DIAGNOSIS — H43812 Vitreous degeneration, left eye: Secondary | ICD-10-CM | POA: Diagnosis not present

## 2024-01-28 ENCOUNTER — Telehealth (HOSPITAL_BASED_OUTPATIENT_CLINIC_OR_DEPARTMENT_OTHER): Payer: Self-pay | Admitting: Family Medicine

## 2024-01-28 ENCOUNTER — Encounter (HOSPITAL_BASED_OUTPATIENT_CLINIC_OR_DEPARTMENT_OTHER): Payer: Self-pay | Admitting: Family Medicine

## 2024-01-28 ENCOUNTER — Ambulatory Visit (INDEPENDENT_AMBULATORY_CARE_PROVIDER_SITE_OTHER): Admitting: Family Medicine

## 2024-01-28 VITALS — BP 165/84 | HR 65 | Ht 70.87 in | Wt 196.9 lb

## 2024-01-28 DIAGNOSIS — N1831 Chronic kidney disease, stage 3a: Secondary | ICD-10-CM

## 2024-01-28 DIAGNOSIS — E1165 Type 2 diabetes mellitus with hyperglycemia: Secondary | ICD-10-CM

## 2024-01-28 DIAGNOSIS — I10 Essential (primary) hypertension: Secondary | ICD-10-CM

## 2024-01-28 DIAGNOSIS — E785 Hyperlipidemia, unspecified: Secondary | ICD-10-CM

## 2024-01-28 MED ORDER — OZEMPIC (0.25 OR 0.5 MG/DOSE) 2 MG/3ML ~~LOC~~ SOPN: 0.2500 mg | PEN_INJECTOR | SUBCUTANEOUS | 0 refills | Status: DC

## 2024-01-28 NOTE — Assessment & Plan Note (Signed)
 Continue statin

## 2024-01-28 NOTE — Assessment & Plan Note (Signed)
 Possible anxiety component.  Check on him tomorrow and encourage a nursing BP check later this week.

## 2024-01-28 NOTE — Telephone Encounter (Signed)
 Pt was wanting to see if provider knew where his neurologist had moved to and wanted to see about being referred to her.  He stated they were moving him to another provider in [place] and that provider specializes in sleep.  Dr. Dottie said that he would be fine staying at that practice that they would move him to a good provider.

## 2024-01-28 NOTE — Telephone Encounter (Signed)
 Copied from CRM [id number]. Topic: Clinical - Medical Advice >> Jan 28, 2024 12:29 PM Joel Gonzales wrote: Reason for CRM: Patient request to have pcp to return a call to him. Patient did not provide the reason for return call.

## 2024-01-28 NOTE — Progress Notes (Unsigned)
 Established Patient Office Visit  Subjective   Patient ID: Joel Gonzales., male    DOB: Apr 19, 1945  Age: 78 y.o. MRN: [id number]  Chief Complaint  Patient presents with   Medical Management of Chronic Issues    F/u as above.  See last note for details, including my instructions after reviewing his labs.  I'm sorry to hear that he didn't get that message.  In any case, we will replace his Metformin  with a safer option based on his CKD.  We reviewed this in detailed, lay terms.  He was also reminded to avoid all NSAIDS in lay terms.    Past Medical History:  Diagnosis Date   CAD (coronary artery disease)    f/by [facility]   CKD (chronic kidney disease) stage 3, GFR 30-59 ml/min (HCC)    Diabetes mellitus without complication (HCC)    Type 2   GERD (gastroesophageal reflux disease)    History of stroke    Hyperlipemia    Hypertension    Osteoarthritis    RBBB    chronic   Sleep disorder    Study declined   Vascular parkinsonism (HCC)    with secondary ataxia f/by Dr. Ines    Outpatient Encounter Medications as of 01/28/2024  Medication Sig   atorvastatin  (LIPITOR ) 80 MG tablet Take 1 tablet (80 mg total) by mouth at bedtime.   carbidopa -levodopa  (SINEMET  IR) 25-100 MG tablet TAKE 1 TABLET BY MOUTH TWO TIMES DAILY. TAKE ON AN EMPTY STOMACH OR WITH CRACKERS IF MAKES NAUSEATED, IF HAVE SIDE EFFECTS TRY 1/2 TABLET TWICE DAILY.   clopidogrel  (PLAVIX ) 75 MG tablet Take 1 tablet (75 mg total) by mouth daily.   Empagliflozin -metFORMIN  HCl ER (SYNJARDY  XR) 25-1000 MG TB24 Take 1 tablet by mouth daily.   metoprolol  succinate (TOPROL -XL) 50 MG 24 hr tablet Take 1 tablet (50 mg total) by mouth at bedtime.   pantoprazole  (PROTONIX ) 40 MG tablet Take 40 mg by mouth daily.   pioglitazone  (ACTOS ) 15 MG tablet Take 1 tablet (15 mg total) by mouth daily.   ramipril  (ALTACE ) 5 MG capsule Take 1 capsule (5 mg total) by mouth 2 (two) times daily.   Semaglutide,0.25 or 0.5MG /DOS,  (OZEMPIC, 0.25 OR 0.5 MG/DOSE,) 2 MG/3ML SOPN Inject 0.25 mg into the skin once a week.   triamcinolone cream (KENALOG) 0.1 % Apply 1 Application topically 2 (two) times daily. Use prn itching and irritation (Patient not taking: Reported on 01/28/2024)   [DISCONTINUED] Empagliflozin -metFORMIN  HCl ER (SYNJARDY  XR) 25-1000 MG TB24 Take by mouth.   No facility-administered encounter medications on file as of 01/28/2024.    Social History   Tobacco Use   Smoking status: Never   Smokeless tobacco: Never  Vaping Use   Vaping status: Never Used  Substance Use Topics   Alcohol  use: Not Currently    Comment: minor   Drug use: No    {History (Optional):23778}  Review of Systems  Constitutional:  Negative for diaphoresis, fever, malaise/fatigue and weight loss.  Respiratory:  Negative for cough, shortness of breath and wheezing.   Cardiovascular:  Negative for chest pain, palpitations, orthopnea, claudication, leg swelling and PND.      Objective:     BP (!) 165/84   Pulse 65   Ht 5' 10.87 (1.8 m)   Wt 196 lb 14.4 oz (89.3 kg)   SpO2 95%   BMI 27.56 kg/m  {Vitals History (Optional):23777}  Physical Exam Constitutional:      General: He is  not in acute distress.    Appearance: Normal appearance.  HENT:     Head: Normocephalic.  Neck:     Vascular: No carotid bruit.  Cardiovascular:     Rate and Rhythm: Normal rate and regular rhythm.     Pulses: Normal pulses.     Heart sounds: Normal heart sounds.  Pulmonary:     Effort: Pulmonary effort is normal.     Breath sounds: Normal breath sounds.  Abdominal:     General: Bowel sounds are normal.     Palpations: Abdomen is soft.  Musculoskeletal:     Cervical back: Neck supple. No tenderness.     Right lower leg: No edema.     Left lower leg: No edema.     Comments: Feet look OK  Neurological:     Mental Status: He is alert.      No results found for any visits on 01/28/24.  {Labs (Optional):23779}  The ASCVD  Risk score (Arnett DK, et al., 2019) failed to calculate for the following reasons:   Risk score cannot be calculated because patient has a medical history suggesting prior/existing ASCVD    Assessment & Plan:  Uncontrolled type 2 diabetes mellitus with hyperglycemia (HCC) Assessment & Plan: Stop Synjardy  due to CKD, and see if Ozempic is affordable.  Our staff will check with him tomorrow on this.  Orders: -     Ozempic (0.25 or 0.5 MG/DOSE); Inject 0.25 mg into the skin once a week.  Dispense: 3 mL; Refill: 0  Stage 3a chronic kidney disease (HCC) Assessment & Plan: Discussion as above.  Continue to monitor this off of the Metformin  and clarify in the coming months if Nephrology referral is indicated.   Essential hypertension Assessment & Plan: Possible anxiety component.  Check on him tomorrow and encourage a nursing BP check later this week.   Dyslipidemia Assessment & Plan: Continue statin.     Return in about 3 months (around 04/29/2024) for chronic follow-up.    REDDING PONCE NORLEEN FALCON., MD

## 2024-01-28 NOTE — Assessment & Plan Note (Signed)
 Stop Synjardy  due to CKD, and see if Ozempic is affordable.  Our staff will check with him tomorrow on this.

## 2024-01-28 NOTE — Assessment & Plan Note (Signed)
 Discussion as above.  Continue to monitor this off of the Metformin  and clarify in the coming months if Nephrology referral is indicated.

## 2024-01-29 ENCOUNTER — Other Ambulatory Visit (HOSPITAL_BASED_OUTPATIENT_CLINIC_OR_DEPARTMENT_OTHER): Payer: Self-pay | Admitting: Family Medicine

## 2024-01-29 ENCOUNTER — Ambulatory Visit (HOSPITAL_BASED_OUTPATIENT_CLINIC_OR_DEPARTMENT_OTHER)

## 2024-01-29 ENCOUNTER — Other Ambulatory Visit (HOSPITAL_BASED_OUTPATIENT_CLINIC_OR_DEPARTMENT_OTHER): Payer: Self-pay

## 2024-01-29 VITALS — BP 144/79 | HR 57

## 2024-01-29 DIAGNOSIS — I10 Essential (primary) hypertension: Secondary | ICD-10-CM

## 2024-01-29 DIAGNOSIS — E1165 Type 2 diabetes mellitus with hyperglycemia: Secondary | ICD-10-CM

## 2024-01-29 MED ORDER — OZEMPIC (0.25 OR 0.5 MG/DOSE) 2 MG/3ML ~~LOC~~ SOPN
0.2500 mg | PEN_INJECTOR | SUBCUTANEOUS | 0 refills | Status: DC
  Filled 2024-01-29: qty 3, 42d supply, fill #0

## 2024-01-29 NOTE — Progress Notes (Unsigned)
 Patient is in office today for a nurse visit for Blood Pressure Check. Patient blood pressure was 146/82, Patient is not having any cardiac related sxs. Second BP reading was 144/79. Pt said BP has been 140's/70's in the last 4 weeks. Before that it was 120's/60's.

## 2024-01-30 ENCOUNTER — Telehealth (HOSPITAL_BASED_OUTPATIENT_CLINIC_OR_DEPARTMENT_OTHER): Payer: Self-pay

## 2024-01-30 NOTE — Telephone Encounter (Signed)
   Message Received: Today Joel Norleen PHEBE PONCE, MD  Miriam Joseph, CMA If these are standing BP readings, I think they are adequate for now.  He should alert us  if his SBP starts running consistently greater than 145.

## 2024-02-04 ENCOUNTER — Telehealth (HOSPITAL_BASED_OUTPATIENT_CLINIC_OR_DEPARTMENT_OTHER): Payer: Self-pay

## 2024-02-04 NOTE — Telephone Encounter (Signed)
 Pt advised BP is adequate. Informed pt to check BP readings standing and if Systolic is higher than 145, to give us  a call.

## 2024-02-07 ENCOUNTER — Other Ambulatory Visit (HOSPITAL_BASED_OUTPATIENT_CLINIC_OR_DEPARTMENT_OTHER): Payer: Self-pay | Admitting: Family Medicine

## 2024-02-07 ENCOUNTER — Telehealth (HOSPITAL_BASED_OUTPATIENT_CLINIC_OR_DEPARTMENT_OTHER): Payer: Self-pay | Admitting: *Deleted

## 2024-02-07 NOTE — Telephone Encounter (Signed)
 Copied from CRM [id number]. Topic: Clinical - Medical Advice >> Feb 07, 2024 11:24 AM Leonette SQUIBB wrote: Reason for CRM: patient called saying he was told to call and report his blood pressures.  They are as follows. 160/94, 142/79 and 183/99

## 2024-02-08 ENCOUNTER — Other Ambulatory Visit (HOSPITAL_BASED_OUTPATIENT_CLINIC_OR_DEPARTMENT_OTHER): Payer: Self-pay | Admitting: Family Medicine

## 2024-02-08 DIAGNOSIS — I10 Essential (primary) hypertension: Secondary | ICD-10-CM

## 2024-02-08 MED ORDER — AMLODIPINE BESYLATE 5 MG PO TABS: 5.0000 mg | ORAL_TABLET | Freq: Every day | ORAL | 2 refills | Status: DC

## 2024-02-11 ENCOUNTER — Telehealth (HOSPITAL_BASED_OUTPATIENT_CLINIC_OR_DEPARTMENT_OTHER): Payer: Self-pay | Admitting: *Deleted

## 2024-02-11 ENCOUNTER — Other Ambulatory Visit (HOSPITAL_BASED_OUTPATIENT_CLINIC_OR_DEPARTMENT_OTHER): Payer: Self-pay | Admitting: Family Medicine

## 2024-02-11 DIAGNOSIS — I10 Essential (primary) hypertension: Secondary | ICD-10-CM

## 2024-02-11 MED ORDER — RAMIPRIL 5 MG PO CAPS: 5.0000 mg | ORAL_CAPSULE | Freq: Two times a day (BID) | ORAL | 1 refills | Status: AC

## 2024-02-11 NOTE — Telephone Encounter (Signed)
 Copied from CRM [id number]. Topic: Clinical - Medication Question >> Feb 11, 2024 10:10 AM Tinnie BROCKS wrote: Reason for CRM: [facility] calling about refill request for ramipril  (ALTACE ) 5 MG capsule. Prev sent by another provider. He told pharmacy that Dr. Dottie said he would be the one to prescribe it next. Pharmacy requesting follow up stating they have sent multiple requests and pt is completely out.   [facility] - [place], [place] - [address] [place] [address] Phone: [phone] Fax: [phone]

## 2024-03-04 ENCOUNTER — Other Ambulatory Visit (HOSPITAL_BASED_OUTPATIENT_CLINIC_OR_DEPARTMENT_OTHER): Payer: Self-pay | Admitting: Family Medicine

## 2024-03-10 ENCOUNTER — Other Ambulatory Visit (HOSPITAL_BASED_OUTPATIENT_CLINIC_OR_DEPARTMENT_OTHER): Payer: Self-pay | Admitting: Family Medicine

## 2024-03-10 ENCOUNTER — Other Ambulatory Visit: Payer: Self-pay

## 2024-03-10 DIAGNOSIS — E1165 Type 2 diabetes mellitus with hyperglycemia: Secondary | ICD-10-CM

## 2024-03-11 ENCOUNTER — Other Ambulatory Visit: Payer: Self-pay

## 2024-03-11 ENCOUNTER — Other Ambulatory Visit (HOSPITAL_BASED_OUTPATIENT_CLINIC_OR_DEPARTMENT_OTHER): Payer: Self-pay | Admitting: Family Medicine

## 2024-03-11 ENCOUNTER — Other Ambulatory Visit (HOSPITAL_BASED_OUTPATIENT_CLINIC_OR_DEPARTMENT_OTHER): Payer: Self-pay

## 2024-03-11 DIAGNOSIS — E1165 Type 2 diabetes mellitus with hyperglycemia: Secondary | ICD-10-CM

## 2024-03-11 DIAGNOSIS — E1129 Type 2 diabetes mellitus with other diabetic kidney complication: Secondary | ICD-10-CM

## 2024-03-11 MED ORDER — PIOGLITAZONE HCL 15 MG PO TABS: 15.0000 mg | ORAL_TABLET | Freq: Every day | ORAL | 0 refills | Status: DC

## 2024-03-11 NOTE — Telephone Encounter (Unsigned)
 Copied from CRM #[id number]. Topic: Clinical - Medication Refill >> Mar 11, 2024 11:04 AM Hadassah PARAS wrote: Medication: pioglitazone  (ACTOS ) 15 MG tablet   Has the patient contacted their pharmacy? Yes (Agent: If no, request that the patient contact the pharmacy for the refill. If patient does not wish to contact the pharmacy document the reason why and proceed with request.) (Agent: If yes, when and what did the pharmacy advise?)  This is the patient's preferred pharmacy:  [facility] - [place], [place] - [address] [place] [address] Phone: [phone] Fax: [phone]   Is this the correct pharmacy for this prescription? Yes If no, delete pharmacy and type the correct one.   Has the prescription been filled recently? Yes  Is the patient out of the medication? Yes  Has the patient been seen for an appointment in the last year OR does the patient have an upcoming appointment? Yes  Can we respond through MyChart? No  Agent: Please be advised that Rx refills may take up to 3 business days. We ask that you follow-up with your pharmacy.

## 2024-03-12 ENCOUNTER — Other Ambulatory Visit (HOSPITAL_BASED_OUTPATIENT_CLINIC_OR_DEPARTMENT_OTHER): Payer: Self-pay | Admitting: Family Medicine

## 2024-03-12 ENCOUNTER — Other Ambulatory Visit (HOSPITAL_BASED_OUTPATIENT_CLINIC_OR_DEPARTMENT_OTHER): Payer: Self-pay

## 2024-03-12 ENCOUNTER — Other Ambulatory Visit (INDEPENDENT_AMBULATORY_CARE_PROVIDER_SITE_OTHER)

## 2024-03-12 ENCOUNTER — Encounter: Payer: Self-pay | Admitting: Orthopaedic Surgery

## 2024-03-12 ENCOUNTER — Telehealth (HOSPITAL_BASED_OUTPATIENT_CLINIC_OR_DEPARTMENT_OTHER): Payer: Self-pay | Admitting: Family Medicine

## 2024-03-12 ENCOUNTER — Ambulatory Visit: Admitting: Orthopaedic Surgery

## 2024-03-12 DIAGNOSIS — Z96642 Presence of left artificial hip joint: Secondary | ICD-10-CM | POA: Diagnosis not present

## 2024-03-12 DIAGNOSIS — E1165 Type 2 diabetes mellitus with hyperglycemia: Secondary | ICD-10-CM

## 2024-03-12 DIAGNOSIS — M25552 Pain in left hip: Secondary | ICD-10-CM | POA: Diagnosis not present

## 2024-03-12 MED ORDER — OZEMPIC (0.25 OR 0.5 MG/DOSE) 2 MG/3ML ~~LOC~~ SOPN
0.5000 mg | PEN_INJECTOR | SUBCUTANEOUS | 3 refills | Status: DC
  Filled 2024-03-18 – 2024-03-31 (×3): qty 3, 28d supply, fill #0

## 2024-03-12 MED ORDER — OZEMPIC (0.25 OR 0.5 MG/DOSE) 2 MG/3ML ~~LOC~~ SOPN
0.2500 mg | PEN_INJECTOR | SUBCUTANEOUS | 0 refills | Status: DC
  Filled 2024-03-12: qty 3, 56d supply, fill #0

## 2024-03-12 MED ORDER — OZEMPIC (0.25 OR 0.5 MG/DOSE) 2 MG/3ML ~~LOC~~ SOPN: 0.2500 mg | PEN_INJECTOR | SUBCUTANEOUS | 3 refills | Status: DC

## 2024-03-12 MED ORDER — TIZANIDINE HCL 2 MG PO TABS: 2.0000 mg | ORAL_TABLET | Freq: Three times a day (TID) | ORAL | 0 refills | Status: DC | PRN

## 2024-03-12 NOTE — Progress Notes (Signed)
 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  He does ambulate with a cane.  His left hip was replaced in December 2023 and his right hip and November 2022.  He has been having some left hip pain for some time now this got worse over the last 1 to 2 weeks and does wake him up at night.  However he points to the left side of his low back as a source of his pain and not his hip itself.  He does ambulate with a cane.  He is on Plavix  so he cannot take anti-inflammatories.  He denies any specific injury or radicular symptoms.  On exam his left and right hips move smoothly and fluidly.  His pain is in the lower lumbar spine and the upper pelvis to the left side and looks like to me on a clinical exam is more facet joint mediated.  X-rays of the pelvis and left hip that were obtained today show normal-appearing hip replacements.  This is definitely not a hip issue and more of a back issue.  I have recommended alternating Voltaren gel and Salonpas lidocaine  patches and I will send in a low-dose of Zanaflex for him.  I have recommended outpatient physical therapy but he has deferred this.  If this is not getting better after the next week or so he should call about being set up for an MRI of his lumbar spine so we can determine whether or not he would benefit from a facet joint injection to the left side of the lower facet joints.  They agree with this treatment plan.  All questions and concerns were addressed and answered.

## 2024-03-12 NOTE — Telephone Encounter (Signed)
 Copied from CRM #[id number]. Topic: Clinical - Medication Question >> Mar 11, 2024  3:07 PM Berwyn MATSU wrote: Reason for CRM: Patients pharmacy called in to follow up on ACTOS  refill as patient is going out of town and needs refill.  May you please advise.

## 2024-03-12 NOTE — Telephone Encounter (Signed)
 Refill Ozempic  was sent in to wrong Pharmacy and needs to be sent to [facility] patient states he is going out of town today. This is a patient of Dr Lujean.

## 2024-03-13 ENCOUNTER — Other Ambulatory Visit (HOSPITAL_BASED_OUTPATIENT_CLINIC_OR_DEPARTMENT_OTHER): Payer: Self-pay

## 2024-03-13 ENCOUNTER — Telehealth (HOSPITAL_BASED_OUTPATIENT_CLINIC_OR_DEPARTMENT_OTHER): Payer: Self-pay | Admitting: Family Medicine

## 2024-03-13 ENCOUNTER — Telehealth (HOSPITAL_BASED_OUTPATIENT_CLINIC_OR_DEPARTMENT_OTHER): Payer: Self-pay | Admitting: *Deleted

## 2024-03-13 NOTE — Telephone Encounter (Signed)
 Pt. Made aware of Rx.

## 2024-03-13 NOTE — Telephone Encounter (Unsigned)
 Copied from CRM #[id number]. Topic: Clinical - Medication Refill >> Mar 13, 2024  9:14 AM Suzen RAMAN wrote: Medication: Semaglutide ,0.25 or 0.5MG /DOS, (OZEMPIC , 0.25 OR 0.5 MG/DOSE,) 2 MG/3ML SOPN  **Patient is aware that medication was just filled yesterday but states he will be out of town when the next refill is due so he would like this scripted by next week so he is able to pick It up and have it when he travels out of town**   Has the patient contacted their pharmacy? Yes   This is the patient's preferred pharmacy:  [facility] [address], [address] [place] [address] Phone: [phone] Fax: [phone]  Is this the correct pharmacy for this prescription? Yes If no, delete pharmacy and type the correct one.   Has the prescription been filled recently? Yes; yesterday  Is the patient out of the medication? No  Has the patient been seen for an appointment in the last year OR does the patient have an upcoming appointment? Yes  Can we respond through MyChart? No  Agent: Please be advised that Rx refills may take up to 3 business days. We ask that you follow-up with your pharmacy.

## 2024-03-17 DIAGNOSIS — Z23 Encounter for immunization: Secondary | ICD-10-CM | POA: Diagnosis not present

## 2024-03-18 ENCOUNTER — Other Ambulatory Visit (HOSPITAL_BASED_OUTPATIENT_CLINIC_OR_DEPARTMENT_OTHER): Payer: Self-pay

## 2024-03-31 ENCOUNTER — Other Ambulatory Visit (HOSPITAL_BASED_OUTPATIENT_CLINIC_OR_DEPARTMENT_OTHER): Payer: Self-pay

## 2024-03-31 ENCOUNTER — Other Ambulatory Visit (HOSPITAL_BASED_OUTPATIENT_CLINIC_OR_DEPARTMENT_OTHER): Payer: Self-pay | Admitting: Family Medicine

## 2024-04-02 ENCOUNTER — Other Ambulatory Visit: Payer: Self-pay | Admitting: Neurology

## 2024-04-02 NOTE — Telephone Encounter (Signed)
 Pt called to request refill for medication carbidopa -levodopa  (SINEMET  IR) 25-100 MG tablet  , Pt is going out of town next week and requested for medication to be filled to day if possible   Pt medication is to be sent to  [facility] - [place], [place] - [address] Phone: [phone]  Fax: [phone]

## 2024-04-25 ENCOUNTER — Ambulatory Visit: Payer: Self-pay

## 2024-04-25 ENCOUNTER — Ambulatory Visit (INDEPENDENT_AMBULATORY_CARE_PROVIDER_SITE_OTHER): Admitting: Family Medicine

## 2024-04-25 VITALS — BP 118/80 | HR 105 | Temp 97.8°F | Resp 16 | Wt 187.6 lb

## 2024-04-25 DIAGNOSIS — E1129 Type 2 diabetes mellitus with other diabetic kidney complication: Secondary | ICD-10-CM

## 2024-04-25 DIAGNOSIS — R110 Nausea: Secondary | ICD-10-CM

## 2024-04-25 DIAGNOSIS — R5383 Other fatigue: Secondary | ICD-10-CM | POA: Diagnosis not present

## 2024-04-25 DIAGNOSIS — R809 Proteinuria, unspecified: Secondary | ICD-10-CM

## 2024-04-25 NOTE — Telephone Encounter (Signed)
 FYI Only or Action Required?: FYI only for provider: appointment scheduled on 04/25/24.  Patient was last seen in primary care on 01/28/2024 by Dottie Norleen PHEBE PONCE, MD.  Called Nurse Triage reporting Diarrhea and Vomiting.  Symptoms began several days ago.  Interventions attempted: Rest, hydration, or home remedies.  Symptoms are: unchanged.  Triage Disposition: See HCP Within 4 Hours (Or PCP Triage)  Patient/caregiver understands and will follow disposition?: Yes Reason for Disposition  [1] SEVERE diarrhea (e.g., 7 or more times / day more than normal) AND [2] age > 60 years  Answer Assessment - Initial Assessment Questions Pt has had diarrhea since Sunday 04/21/24 after his higher dose ozempic injection, vomited one and a half times at 0200 this morning.   Patient last ate yesterday at noon, has not eaten since then. Denies fever or abdominal pain.   Also reports bilateral leg soreness when he eats chocolate. Pt did travel within the [place] recently and someone that was with him was sick when he returned home.  1. DIARRHEA SEVERITY: How bad is the diarrhea? How many more stools have you had in the past 24 hours than normal?      10 + times yesterday 2. ONSET: When did the diarrhea begin?      04/21/24 3. STOOL DESCRIPTION:  How loose or watery is the diarrhea? What is the stool color? Is there any blood or mucous in the stool?     Loose/watery and brown 4. VOMITING: Are you also vomiting? If Yes, ask: How many times in the past 24 hours?      One and a half times at 0200 04/25/24 5. ABDOMEN PAIN: Are you having any abdomen pain? If Yes, ask: What does it feel like? (e.g., crampy, dull, intermittent, constant)      Only cramping prior to BM 6. ABDOMEN PAIN SEVERITY: If present, ask: How bad is the pain?  (e.g., Scale 1-10; mild, moderate, or severe)     NA 8. HYDRATION: Any signs of dehydration? (e.g., dry mouth [not just dry lips], too weak to stand,  dizziness, new weight loss) When did you last urinate?     A little bit, I am weak. Pt states he is urinating normally.  10. ANTIBIOTIC USE: Are you taking antibiotics now or have you taken antibiotics in the past 2 months?  Protocols used: [facility] Message from Ahlexyia S sent at 04/25/2024  7:45 AM EST  Reason for Triage: Pt wife called in stating that pt is having uncontrolled diarrhea and vomiting. Wife stated that pt hasn't ate anything and pt body aches. Denied pt having a fever and being nauseated. Pt does have some pain within his legs but pt denied them being swollen.   Past Medical History:  Diagnosis Date   CAD (coronary artery disease)    f/by [facility]   CKD (chronic kidney disease) stage 3, GFR 30-59 ml/min (HCC)    Diabetes mellitus without complication (HCC)    Type 2   GERD (gastroesophageal reflux disease)    History of stroke    Hyperlipemia    Hypertension    Osteoarthritis    RBBB    chronic   Sleep disorder    Study declined   Vascular parkinsonism (HCC)    with secondary ataxia f/by Dr. Ines

## 2024-04-25 NOTE — Progress Notes (Signed)
 "  Established Patient Office Visit  Subjective   Patient ID: Joel Lucken., male    DOB: 07-30-1945  Age: 79 y.o. MRN: [id number]  Chief Complaint  Patient presents with   Diarrhea    Diarrhea    Discussed the use of AI scribe software for clinical note transcription with the patient, who gave verbal consent to proceed.  History of Present Illness Joel Gonzales is a 79 year old male who presents with gastrointestinal distress potentially related to Ozempic  use.  He started Ozempic  and experienced significant gastrointestinal side effects. Initially, he had severe constipation during the first week, which he managed with laxatives and stool softeners. This was followed by a couple of days of diarrhea. After adjusting his diet to include more roughage, his symptoms improved temporarily.  Last weekend, after taking a higher dose of Ozempic , he experienced severe diarrhea again, which persisted throughout the week. He reports not having an appetite and has not been eating much at all. Early this morning, he vomited for the first time since starting the medication. He also experienced frequent bowel movements, approximately ten to twelve times last night.  He mentions recent travel to [place] for a trade show and [place], where he was in contact with a colleague who had the flu. The colleague exhibited symptoms such as vomiting and a high fever.  No underlying constipation issues independent of Ozempic  use, although he occasionally experiences constipation due to dietary choices or inactivity. He has been monitoring his blood sugar levels at home, which have been stable. He notes a lack of appetite since starting Ozempic , although he has not observed any weight loss. He consumes sugary foods, although he disputes the extent of this intake.  HE STATES HE IS MUCH IMPROVED TODAY AFTER RECENTLY STOPPING THE OZEMPIC .    Past Medical History:  Diagnosis Date   CAD (coronary  artery disease)    f/by [facility]   CKD (chronic kidney disease) stage 3, GFR 30-59 ml/min (HCC)    Diabetes mellitus without complication (HCC)    Type 2   GERD (gastroesophageal reflux disease)    History of stroke    Hyperlipemia    Hypertension    Osteoarthritis    RBBB    chronic   Sleep disorder    Study declined   Vascular parkinsonism (HCC)    with secondary ataxia f/by Dr. Ines    Outpatient Encounter Medications as of 04/25/2024  Medication Sig   amLODipine  (NORVASC ) 5 MG tablet Take 1 tablet (5 mg total) by mouth daily.   atorvastatin  (LIPITOR ) 80 MG tablet TAKE 1 TABLET BY MOUTH DAILY   carbidopa -levodopa  (SINEMET  IR) 25-100 MG tablet TAKE 1 TABLET BY MOUTH TWO TIMES DAILY. TAKE ON AN EMPTY STOMACH OR WITH CRACKERS IF MAKES NAUSEATED, IF HAVE SIDE EFFECTS TRY 1/2 TABLET TWICE DAILY.   clopidogrel  (PLAVIX ) 75 MG tablet Take 1 tablet (75 mg total) by mouth daily.   metoprolol  succinate (TOPROL -XL) 50 MG 24 hr tablet Take 1 tablet (50 mg total) by mouth at bedtime.   pantoprazole  (PROTONIX ) 40 MG tablet Take 40 mg by mouth daily.   pioglitazone  (ACTOS ) 15 MG tablet Take 1 tablet (15 mg total) by mouth daily.   ramipril  (ALTACE ) 5 MG capsule Take 1 capsule (5 mg total) by mouth 2 (two) times daily.   Semaglutide ,0.25 or 0.5MG /DOS, (OZEMPIC , 0.25 OR 0.5 MG/DOSE,) 2 MG/3ML SOPN Inject 0.25 mg into the skin once a week.   Semaglutide ,0.25  or 0.5MG /DOS, (OZEMPIC , 0.25 OR 0.5 MG/DOSE,) 2 MG/3ML SOPN Inject 0.25 mg into the skin once a week.   Semaglutide ,0.25 or 0.5MG /DOS, (OZEMPIC , 0.25 OR 0.5 MG/DOSE,) 2 MG/3ML SOPN Inject 0.5 mg into the skin once a week.   SHINGRIX injection    tiZANidine  (ZANAFLEX ) 2 MG tablet Take 1 tablet (2 mg total) by mouth every 8 (eight) hours as needed for muscle spasms.   triamcinolone  cream (KENALOG ) 0.1 % Apply 1 Application topically 2 (two) times daily. Use prn itching and irritation   No facility-administered encounter medications on file  as of 04/25/2024.    Social History[1]    Review of Systems  Constitutional:  Positive for malaise/fatigue. Negative for diaphoresis, fever and weight loss.  Respiratory:  Negative for cough, shortness of breath and wheezing.   Cardiovascular:  Negative for chest pain, palpitations, orthopnea, claudication, leg swelling and PND.      Objective:     BP 118/80 (BP Location: Right Leg, Patient Position: Standing, Cuff Size: Normal)   Pulse (!) 105   Temp 97.8 F (36.6 C) (Oral)   Resp 16   Wt 187 lb 9.6 oz (85.1 kg)   SpO2 95%   BMI 26.26 kg/m    Physical Exam Constitutional:      General: He is not in acute distress.    Appearance: Normal appearance.     Comments: Chronically ill appearing.  Mild dehydration.  Stiff gait with cane used.  HENT:     Head: Normocephalic.     Nose: Nose normal.     Mouth/Throat:     Pharynx: Oropharynx is clear.  Eyes:     Conjunctiva/sclera: Conjunctivae normal.  Neck:     Vascular: No carotid bruit.  Cardiovascular:     Rate and Rhythm: Normal rate and regular rhythm.     Pulses: Normal pulses.     Heart sounds: Normal heart sounds.  Pulmonary:     Effort: Pulmonary effort is normal.     Breath sounds: Normal breath sounds.  Abdominal:     General: Bowel sounds are normal. There is no distension.     Palpations: Abdomen is soft.     Tenderness: There is no abdominal tenderness.  Musculoskeletal:     Cervical back: Neck supple. No tenderness.     Right lower leg: No edema.     Left lower leg: No edema.  Neurological:     Mental Status: He is alert.      No results found for any visits on 04/25/24.    The ASCVD Risk score (Arnett DK, et al., 2019) failed to calculate for the following reasons:   Risk score cannot be calculated because patient has a medical history suggesting prior/existing ASCVD   * - Cholesterol units were assumed    Assessment & Plan:   Assessment & Plan Diabetes mellitus with microalbuminuria  (HCC) Blood glucose levels are well-managed. Discontinuation of Ozempic  may eventually lead to increased blood glucose levels, but there is flexibility in management due to current stability. Alternative medications are available but will be considered after gastrointestinal recovery. - Checked A1c blood test today - Advised to espeically avoid liquid sweets such as Pepsi, milkshakes, and sweet tea - Encouraged use of artificial sweeteners if needed Orders:   Comprehensive metabolic panel with GFR   CBC with Differential/Platelet   Hemoglobin A1c  Fatigue, unspecified type Likely related to gastrointestinal distress and recent illness exposure. Symptoms should continue to improve with discontinuation of Ozempic  and dietary  adjustments. - Recommended bland diet and avoidance of irritants as per gastrointestinal plan Orders:   Vitamin B12   TSH  Nausea Recent gastrointestinal distress, including diarrhea and nausea, following Ozempic  administration. Symptoms correlate with medication use, indicating intolerance rather than allergy.  Alternative medications are available but will be considered after gastrointestinal recovery. - Discontinued Ozempic  - Recommended bland diet: bananas, rice, applesauce, toast - Advised to avoid caffeine, dairy, grease, and fat for a few days.  Will check on him early next week.       No follow-ups on file.    REDDING PONCE NORLEEN FALCON., MD     [1]  Social History Tobacco Use   Smoking status: Never   Smokeless tobacco: Never  Vaping Use   Vaping status: Never Used  Substance Use Topics   Alcohol  use: Not Currently    Comment: minor   Drug use: No   "

## 2024-04-25 NOTE — Assessment & Plan Note (Addendum)
 Blood glucose levels are well-managed. Discontinuation of Ozempic  may eventually lead to increased blood glucose levels, but there is flexibility in management due to current stability. Alternative medications are available but will be considered after gastrointestinal recovery. - Checked A1c blood test today - Advised to espeically avoid liquid sweets such as Pepsi, milkshakes, and sweet tea - Encouraged use of artificial sweeteners if needed Orders:   Comprehensive metabolic panel with GFR   CBC with Differential/Platelet   Hemoglobin A1c

## 2024-04-26 ENCOUNTER — Ambulatory Visit (HOSPITAL_BASED_OUTPATIENT_CLINIC_OR_DEPARTMENT_OTHER): Payer: Self-pay | Admitting: Family Medicine

## 2024-04-26 LAB — CBC WITH DIFFERENTIAL/PLATELET
Basophils Absolute: 0 10*3/uL (ref 0.0–0.2)
Basos: 0 %
EOS (ABSOLUTE): 0.2 10*3/uL (ref 0.0–0.4)
Eos: 3 %
Hematocrit: 45.5 % (ref 37.5–51.0)
Hemoglobin: 15.1 g/dL (ref 13.0–17.7)
Immature Grans (Abs): 0 10*3/uL (ref 0.0–0.1)
Immature Granulocytes: 0 %
Lymphocytes Absolute: 1.6 10*3/uL (ref 0.7–3.1)
Lymphs: 20 %
MCH: 31.5 pg (ref 26.6–33.0)
MCHC: 33.2 g/dL (ref 31.5–35.7)
MCV: 95 fL (ref 79–97)
Monocytes Absolute: 0.8 10*3/uL (ref 0.1–0.9)
Monocytes: 9 %
Neutrophils Absolute: 5.5 10*3/uL (ref 1.4–7.0)
Neutrophils: 68 %
Platelets: 338 10*3/uL (ref 150–450)
RBC: 4.8 x10E6/uL (ref 4.14–5.80)
RDW: 13.1 % (ref 11.6–15.4)
WBC: 8.2 10*3/uL (ref 3.4–10.8)

## 2024-04-26 LAB — TSH: TSH: 2.07 u[IU]/mL (ref 0.450–4.500)

## 2024-04-26 LAB — COMPREHENSIVE METABOLIC PANEL WITH GFR
ALT: 10 [IU]/L (ref 0–44)
AST: 10 [IU]/L (ref 0–40)
Albumin: 4.9 g/dL — ABNORMAL HIGH (ref 3.8–4.8)
Alkaline Phosphatase: 136 [IU]/L — ABNORMAL HIGH (ref 47–123)
BUN/Creatinine Ratio: 13 (ref 10–24)
BUN: 16 mg/dL (ref 8–27)
Bilirubin Total: 0.9 mg/dL (ref 0.0–1.2)
CO2: 22 mmol/L (ref 20–29)
Calcium: 9.7 mg/dL (ref 8.6–10.2)
Chloride: 97 mmol/L (ref 96–106)
Creatinine, Ser: 1.23 mg/dL (ref 0.76–1.27)
Globulin, Total: 2.7 g/dL (ref 1.5–4.5)
Glucose: 116 mg/dL — ABNORMAL HIGH (ref 70–99)
Potassium: 4.5 mmol/L (ref 3.5–5.2)
Sodium: 137 mmol/L (ref 134–144)
Total Protein: 7.6 g/dL (ref 6.0–8.5)
eGFR: 60 mL/min/{1.73_m2}

## 2024-04-26 LAB — VITAMIN B12: Vitamin B-12: 523 pg/mL (ref 232–1245)

## 2024-04-26 LAB — HEMOGLOBIN A1C
Est. average glucose Bld gHb Est-mCnc: 143 mg/dL
Hgb A1c MFr Bld: 6.6 % — ABNORMAL HIGH (ref 4.8–5.6)

## 2024-04-29 ENCOUNTER — Other Ambulatory Visit (HOSPITAL_BASED_OUTPATIENT_CLINIC_OR_DEPARTMENT_OTHER): Payer: Self-pay | Admitting: Family Medicine

## 2024-04-29 ENCOUNTER — Ambulatory Visit (HOSPITAL_BASED_OUTPATIENT_CLINIC_OR_DEPARTMENT_OTHER): Admitting: Family Medicine

## 2024-04-29 DIAGNOSIS — I10 Essential (primary) hypertension: Secondary | ICD-10-CM

## 2024-05-07 ENCOUNTER — Ambulatory Visit (INDEPENDENT_AMBULATORY_CARE_PROVIDER_SITE_OTHER): Admitting: Neurology

## 2024-05-07 ENCOUNTER — Ambulatory Visit: Admitting: Neurology

## 2024-05-07 ENCOUNTER — Telehealth: Payer: Self-pay | Admitting: Neurology

## 2024-05-07 ENCOUNTER — Encounter: Payer: Self-pay | Admitting: Neurology

## 2024-05-07 VITALS — BP 139/72 | HR 66 | Ht 70.0 in | Wt 192.0 lb

## 2024-05-07 DIAGNOSIS — R269 Unspecified abnormalities of gait and mobility: Secondary | ICD-10-CM

## 2024-05-07 DIAGNOSIS — G3184 Mild cognitive impairment, so stated: Secondary | ICD-10-CM | POA: Diagnosis not present

## 2024-05-07 DIAGNOSIS — G214 Vascular parkinsonism: Secondary | ICD-10-CM | POA: Diagnosis not present

## 2024-05-07 DIAGNOSIS — I999 Unspecified disorder of circulatory system: Secondary | ICD-10-CM

## 2024-05-07 MED ORDER — CARBIDOPA-LEVODOPA 25-100 MG PO TABS: 1.0000 | ORAL_TABLET | Freq: Three times a day (TID) | ORAL | 3 refills | Status: AC

## 2024-05-07 NOTE — Telephone Encounter (Addendum)
 [facility] denied referral due to is a orthopedic not neurological. Representative said she spoke with patient and would prefer [facility].  Referral refax for Physical Therapy to [facility]. Phone: [phone], Fax: [phone]

## 2024-05-07 NOTE — Progress Notes (Signed)
 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    Dr Ines  via [facility] Born has seen him last. Assigned to me without summary.        HISTORY OF PRESENT ILLNESS:  Joel Gonzales. is a 79 y.o. male patient who is seen upon referral Tisovec, Richard W, Md [address] [place],  [place] [address]'d referral on 05/07/2024  for an Evaluation of COGNITIVE CONCERNS . He listed vascular  parkinsonian symptoms in the last, falls, but also uncontrolled DM 2 and HTN.  He saw Dr Ines and Sarah Slack,NP last year, was referred to physical therapy.  He was referred to memory testing.   He had some kind of test in [place] and I have no records of this.  He is still running a business.  Nursery.  ADL questions here 13/ 13   endorsed all ADLs to be independently mastered.    Chief concern ( according to patient)  :       I have the pleasure of seeing Joel Gonzales. on 05/07/24, a right-handed male who reports NO difficulties with  memory, orientation, wordfinding.  The following examples were provided:     Med History: There have been no traumatic brain injuries, prolonged surgeries under general anesthesia, radiation or chemotherapy,  substance abuse, or mental illness.  ( Reviewed evidence  or documentation of : , no Trauma such as TBI/ whiplash,   No known Autoimmune disorders, no cancer,  no Vitamin deficiency,  no Anemia, no malabsorption,    GERD,  Thyroid  disease or other endocrinological disorders, No history of  drinking ETOH, no tobacco use.    Current medications as listed did not  contain anticholinergic substances, narcotics, benzodiazepines or sedatives.    The patient has adequate hearing and vision ability.  His wife is concerned about  hearing loss.   Wife reports  loss of smell.  He denies.   Social history: [id number]@'d Family status is married,  and lives in a private  household with spouse,  without pets.  The daily routines are well established, meal times, bedtime, rise time.  Patient is a occupational hygienist.  There was no [employer] time.  Nicotine use/.   ETOH use /,  Substance use otherwise:  Caffeine intake in form of Coffee( /) Soda( 8 ounces ) Tea ( /) or energy drinks.  Physical activity in form of working outdoors. [name] and Social activities:       [name]. is reporting independence in the following activities of daily living: activities of daily living   The patient drives/ or member of the household drives:  He goes shopping or orders items for daily needs, has access to fresh food in the home, and eats regular meals.  Meals are prepared by the patient or in the household. The patient eats out rarely.  Communication: able to use a land line telephone, a mobile phone, or a computer.  He/She makes appointments by phone or online and keeps appointments.  Reports independence in bill paying, banking, and files paperwork such as for insurance or taxes.  Dressing, toileting, bathing , feeding,   Ambulation : Falls yes, balance problems, attributed  vascular parkinsonism.      Family history : Impaired cognitive function or dementia were affecting the following biological  family members:    maternal great aunt.     The referring physician  has kindly provided the following clinical history information and evolution of symptoms , imaging  and test results:   MMSE:   MOCA  CT head:  MRI brain: 79 year old male with gait difficulty.   79 y.o. 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  Repeat MRI of the brain again showed moderate chronic small vessel ischemic disease and chronic cerebral microhemorrhages. Minimal progression of above finding since 2018.  Could repeat MRI of the cervical spine but cervical spine was neg in 2018 and has been having symptoms since them, he declined a sleep study.    -I do not see any significant rigidity or bradykinesia on exam today.  Seems to have benefited quite well from the addition of Sinemet  25/100 mg twice daily as well as physical therapy, now 3 times weekly cardiac rehab. -I will place a referral for neuropsychological evaluation.  His MoCA was 23/30 today, indicating mild cognitive impairment.  There is some discrepancy between he and his wife regarding his memory abilities. -Close follow-up with PCP for management of vascular risk factors with history of lacunar stroke, BP less than 130/90, A1c less than 7.0, LDL less than 70 -He will follow-up in 6 months or sooner if needed   HISTORY OF PRESENT ILLNESS: Today 10/17/22 Last visit with Dr. Ines 03/01/22.  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  2 negative DAT scans.  Multiple phone calls about memory change.  He was referred to neuropsych in December.  November 2023 he broke his left hip. Wife reports some concern with him getting agitated, frustrated easily. 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 He thinks his walking is good, at times catches himself speeding up. Has gone through PT. Takes Sinemet  25/100 mg twice daily, thinks makes his walking steady. He doesn't think much memory trouble, goes to cardiac rehab 3 days  a week. He drives, no accidents. No other significant falls, some stumbles. He thinks doing well. MOCA 23/30.   HISTORY  Dr. Ines February 28, 2022: 79 year old male with a history of coronary artery disease status post CABG, diabetes, hypertension, hyperlipidemia here for follow-up gait abnormality, moderately advanced white-matter changges, gait abnormality, diagnosed with vascular parkinsonism 4 years ago at [facility], worsening gait and falls.  Diagnosed with vascular parkinsonism at [facility] movement disorders center.   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  Repeat MRI of the brain again showed moderate chronic small vessel ischemic  disease and chronic cerebral microhemorrhages.  Minimal progression of above finding since 2018.  Could repeat MRI of the cervical spine, he declined a sleep study at last appointment.   Already set up for PT at [facility]. Discussed findings, reviewed images, discussed vascular parkinsonism (NOT parkinson's disease), gave literature. Manage medically at this point, watch risk factors we discussed. He starts walking and can;t stop. We discussed sinemet .     IMPRESSION:    MRI brain (with and without) demonstrating: -Moderate chronic small vessel ischemic disease. -Multiple supratentorial and infratentorial chronic cerebral microhemorrhages. -No acute findings.  Minimal progression of above findings since 2018.     INTERPRETING PHYSICIAN:  EDUARD FABIENE HANLON, MD   [facility] : Procedure : SPECT I-123 DaTscan  of the brain.   Indication : 79 year old man with a history of  lower body parkinsonism.   Comparison : None   Radiopharmaceutical and Technique : 5.0 mCi of I-123 Ioflupane (DaTscan ),  injected intravenously.  SPECT images of the brain were acquired and  displayed in the transaxial plane.   Findings :   There is relatively symmetric distribution of radiotracer in the bilateral  caudate nuclei and bilateral putamen. There is minimal decreased activity  in the right putamen compared to the left, which may be related to  technical factors.    Impression:   Normal I-123 DaT scan of the brain.    Electronically Reviewed by:  Reyes Fell, MD, [facility]  Electronically Reviewed on:  03/29/2017 3:16 PM     Referral for neuro psych evaluation / neuropsychology fax to [facility]. Phone: [phone], Fax: [phone].   Dx Mild neurocognitive impairment , MMSE 29/ 30 MOCA 24/ 30.    Review of Systems: Out of a complete 14 system review, the patient complains of only the following symptoms, and all other reviewed systems are negative.:  See above    Social History   Socioeconomic History   Marital status: Married    Spouse name: Not on file   Number of children: 1   Years of education: Not on file   Highest education level: Bachelor's degree (e.g., BA, AB, BS)  Occupational History    Comment: [employer]   Occupation: Retired psychologist, occupational  Tobacco Use   Smoking status: Never   Smokeless tobacco: Never  Vaping Use   Vaping status: Never Used  Substance and Sexual Activity   Alcohol  use: Not Currently    Comment: minor   Drug use: No   Sexual activity: Yes    Birth control/protection: None  Other Topics Concern   Not on file  Social History Narrative   Lives at home with his wife   Right handed   Drinks at least 1 cup daily of soda   Social Drivers of Health   Tobacco Use: Low Risk (05/07/2024)   Patient History    Smoking Tobacco Use: Never    Smokeless Tobacco Use: Never    Passive Exposure: Not on file  Financial Resource Strain: Low Risk (01/24/2024)   Overall Financial Resource Strain (CARDIA)    Difficulty of Paying Living Expenses: Not hard at all   Food Insecurity: No Food Insecurity (01/24/2024)   Epic    Worried About Radiation Protection Practitioner of Food in the Last Year: Never true    Ran Out of Food in the Last Year: Never true  Transportation Needs: No Transportation Needs (01/24/2024)   Epic    Lack of Transportation (Medical): No    Lack of  Transportation (Non-Medical): No  Physical Activity: Insufficiently Active (01/24/2024)   Exercise Vital Sign    Days of Exercise per Week: 5 days    Minutes of Exercise per Session: 10 min  Stress: No Stress Concern Present (01/24/2024)   [employer] of Occupational Health - Occupational Stress Questionnaire    Feeling of Stress: Only a little  Social Connections: Socially Integrated (01/24/2024)   Social Connection and Isolation Panel    Frequency of Communication with Friends and Family: More than three times a week    Frequency of Social Gatherings with Friends and Family: Three times a week    Attends Religious Services: 1 to 4 times per year    Active Member of Clubs or Organizations: Yes    Attends Banker Meetings: 1 to 4 times per year    Marital Status: Married  Depression (PHQ2-9): Low Risk (01/28/2024)   Depression (PHQ2-9)    PHQ-2 Score: 0  Alcohol  Screen: Low Risk (01/24/2024)   Alcohol  Screen    Last Alcohol  Screening Score (AUDIT): 1  Housing: Low Risk (01/24/2024)   Epic    Unable to Pay for Housing in the Last Year: No    Number of Times Moved in the Last Year: 0    Homeless in the Last Year: No  Utilities: Not At Risk (03/03/2022)   AHC Utilities    Threatened with loss of utilities: No  Health Literacy: Not on file    Family History  Problem Relation Age of Onset   Heart attack Father 2       deceased, ASCVD   Hypertension Father    Heart disease Father    Heart Problems Mother        ASCVD   Hypertension Mother    Heart attack Mother    Heart attack Sister    Hypertension Brother    Hydrocephalus Brother     Past Medical History:   Diagnosis Date   CAD (coronary artery disease)    f/by [facility]   CKD (chronic kidney disease) stage 3, GFR 30-59 ml/min (HCC)    Diabetes mellitus without complication (HCC)    Type 2   GERD (gastroesophageal reflux disease)    History of stroke    Hyperlipemia    Hypertension    Osteoarthritis    RBBB    chronic   Sleep disorder    Study declined   Vascular parkinsonism (HCC)    with secondary ataxia f/by Dr. Ines    Past Surgical History:  Procedure Laterality Date   CHOLECYSTECTOMY  04/03/2000   COLONOSCOPY N/A    in distant past, with repeat declined   CORONARY ARTERY BYPASS GRAFT     x6 LIma to his LAD, a RIMA to his PDA, right radial to obtuse marginal branch, diagnonal, second marginal, & posteolateral branches   NOSE SURGERY  02/01/2013   carcinoma   TONSILLECTOMY     as a child   TOTAL HIP ARTHROPLASTY Right 02/18/2021   Procedure: TOTAL HIP ARTHROPLASTY ANTERIOR APPROACH;  Surgeon: Vernetta Lonni GRADE, MD;  Location: [facility] OR;  Service: Orthopedics;  Laterality: Right;   TOTAL HIP ARTHROPLASTY Left 03/03/2022   Procedure: TOTAL HIP ARTHROPLASTY ANTERIOR APPROACH;  Surgeon: Vernetta Lonni GRADE, MD;  Location: [facility] ORS;  Service: Orthopedics;  Laterality: Left;     Medications Ordered Prior to Encounter[1]  Allergies[2]   DIAGNOSTIC DATA (LABS, IMAGING, TESTING) - I reviewed patient records, labs, notes, testing and imaging myself where available.  Lab Results  Component Value Date   WBC 8.2 04/25/2024   HGB 15.1 04/25/2024   HCT 45.5 04/25/2024   MCV 95 04/25/2024   PLT 338 04/25/2024      Component Value Date/Time   NA 137 04/25/2024 1443   K 4.5 04/25/2024 1443   CL 97 04/25/2024 1443   CO2 22 04/25/2024 1443   GLUCOSE 116 (H) 04/25/2024 1443   GLUCOSE 113 (H) 03/13/2022 0556   BUN 16 04/25/2024 1443   CREATININE 1.23 04/25/2024 1443   CALCIUM  9.7 04/25/2024 1443   PROT 7.6 04/25/2024 1443   ALBUMIN 4.9 (H) 04/25/2024 1443   AST  10 04/25/2024 1443   ALT 10 04/25/2024 1443   ALKPHOS 136 (H) 04/25/2024 1443   BILITOT 0.9 04/25/2024 1443   GFRNONAA >60 03/13/2022 0556   GFRAA >60 03/04/2017 1823   Lab Results  Component Value Date   CHOL 133 12/31/2023   HDL 46 12/31/2023   LDLCALC 64 12/31/2023   TRIG 130 12/31/2023   CHOLHDL 2.9 12/31/2023   Lab Results  Component Value Date   HGBA1C 6.6 (H) 04/25/2024   Lab Results  Component Value Date   VITAMINB12 523 04/25/2024   Lab Results  Component Value Date   TSH 2.070 04/25/2024    PHYSICAL EXAM:  Tabular:  No data found.   Body mass index is 27.55 kg/m.   Wt Readings from Last 3 Encounters:  05/07/24 192 lb (87.1 kg)  04/25/24 187 lb 9.6 oz (85.1 kg)  01/28/24 196 lb 14.4 oz (89.3 kg)     Ht Readings from Last 3 Encounters:  05/07/24 5' 10 (1.778 m)  01/28/24 5' 10.87 (1.8 m)  12/31/23 5' 10.87 (1.8 m)      Cardiovascular:  Regular rate and cardiac rhythm by pulse,  without distended neck veins. Respiratory: no tachypnoea or wheezing. .  Skin:  Without evidence of ankle edema, rash, bruising  The patient's posture was  erect.   NEUROLOGIC EXAM: The patient was awake and alert, oriented to place and time.   Attention span & concentration ability appeared normal.  Speech was fluent, without dysarthria, dysphonia or aphasia, and of normal volume.     Cranial nerves:  Patient : no  loss of smell or taste reported - wide disagrees , smell is reduced.  Pupils are round, equal in size and briskly reactive to light.  Funduscopic exam was deferred.  Extraocular movements in vertical and horizontal planes were intact and without nystagmus. (No Diplopia reported). Visual fields by finger perimetry are intact. Hearing was intact to soft voice.    Facial sensation intact ( fine touch).  Facial motor strength: left facial droop.  Symmetric movement -d tongue in  midline.  He reportedly drools out of the right mouth.  Neck ROM: rotation,  tilt and flexion /extension were observed,  shoulder shrug was symmetrical.    Motor exam:  Symmetric bulk, strength and ROM.   Muscle tone was  without cog- wheeling, and there was symmetric grip strength.   Sensory:  Fine touch and vibration were tested by tuning fork and intact.  Proprioception tested in the upper extremities was normal.   Coordination: The patient reported no problems with button closure and no changes to penmanship.   The Finger-to-nose maneuver was intact without evidence of ataxia, dysmetria or tremor.   Gait and station: Patient could rise second attempt  unassisted from a seated position, without  bracing, and walked without assistive device.  Stance  was of normal/ wide width.  The patient turned with 4-5 steps. Toe and heel walk were deferred. No limp was noted.   He is stooped,  Arm swing was preserved. The patient's gait became faster as he walked down the hall.    Deep tendon reflexes: Upper extremities did show symmetric DTRs. Lower extremity DTRs were symmetric and brisk/.     ASSESSMENT  :   Dear  Dr Dottie,   Thank you for entrusting me with your patient's care.   As you know, your patient  Joel Gonzales , a 79 y.o. male presented here on @SERVICEDATE @ for an Evaluation of cognitive decline  upon your request .  In summary, he was assessed with the following  :   Vascular parkinsonism with gait manifestation- sinemet  may have helped him but he is not presenting with tremor.    1) MCI,  I have not seen any impact in cognitive function-  the patient did not undergo Memory testing today, neu ither MMSE nor MOCA.    I will reorder PT locally at [facility] for  gait stabilization, posture and balance, fall prevention.    My Plan is to proceed with:  just yearly RV with NP and MOCA .   I did not order any of the following:  1) GNA DEMENTIA PANEL  2) ATN 3) Late onset ALZHEIMER Genetic risk  4) EEG  I plan to follow up  through our NP or  personally within 12 month.    The patient's condition requires frequent monitoring and adjustments in the treatment plan, reflecting the ongoing complexity of care.  This provider is for the named interval time the continuing focal point for associated medical /neurological needs services for this condition.   A total time of  60  minutes consistent of a part of face to face encounter , exam and interview,  and additional preparation time for chart review was spent. Additionally, the following were reviewed: Past medical records, past medical and surgical history, family and social background, as well as relevant laboratory results, imaging findings, and medical notes, where applicable.  At today's visit, we discussed treatment options, associated risk and benefits, and engage in counseling as needed: Including, but not limited to driving safety, home safety, the benefit of routines and activities.      Recommendations:  Memory strategies were provided in the patient education attachment.   This note was generated in part by using dictation software, and as a result, it may contain unintentional typos and errors.  Nevertheless, effort was made to accurately convey the pertinent aspects of the patient's visit.    Electronically signed by: Dedra Gores, MD 05/07/2024 10:02 AM  [facility] and [facility] Board certified by The [employer] of Sleep Medicine and Diplomate of the [employer] of Sleep Medicine. Board certified In Neurology through the ABPN, Fellow of the [employer] of Neurology. [facility]@ GNA.    Tips for Healthy Aging:   Read the MIND DIET book for nutritional information.  Regular physical activity and daylight exposure. This lifts the mood and entrains a circadian rhythm, leading to better sleep and frees up the mind.   Walking a minimum of  20 minutes a day , if possible outdoors, preferable in a park or nature area.  Indoor  exercised such as stretching , yoga , stationary bike or stair master ( at the gym). Read !  Keep up with daily events , news.  Maintain or establish  new Hobbies , consider Volunteering, and consider becoming a member of a club, church or other community memberships and engagements. Social interactions give purpose, joy and are interactive - Interaction is brain jogging!   Reconsider your driving ability-   Reconsider your home : The home is a single storey ( ranch/ apartment) ?  If you live in a multistory residence, are all stairs equipped with solid handrails on both sides ?  Can any existing staircase be retrofitted with a stair lift .  Do you need an entry ramp ?  The home that is suited to aging in space has low or no barriers to enter , to reach the lavatory  ( wide doors , high toilet seat, handrails), has a no barrier shower ( no tub ) with a scold guard water  temperature setting, handheld shower head , and a freestanding shower seat ( consider a plastic garden armchair !) .   And are the doors wide enough to pass with walker or wheelchair?  Is there enough space in the bedroom to reach the bed from two or better three sides.  De- clutter and arrange your home for safety: place objects at eye height , not above- not below hip height-  you should not need to use a stepstool or ladder.   No overlapping area rugs, sufficient light, and passage space free of furniture , remove breakable items from side tables, night stands.  A Kitchen stovetop powered by induction prevents injuries and fires- replace gas tops and toaster ovens.        [1]  Current Outpatient Medications on File Prior to Visit  Medication Sig Dispense Refill   amLODipine  (NORVASC ) 5 MG tablet TAKE 1 TABLET BY MOUTH DAILY. 90 tablet 1   atorvastatin  (LIPITOR ) 80 MG tablet TAKE 1 TABLET BY MOUTH DAILY 90 tablet 2   carbidopa -levodopa  (SINEMET  IR) 25-100 MG tablet TAKE 1 TABLET BY MOUTH TWO TIMES DAILY. TAKE ON AN EMPTY  STOMACH OR WITH CRACKERS IF MAKES NAUSEATED, IF HAVE SIDE EFFECTS TRY 1/2 TABLET TWICE DAILY. 180 tablet 0   clopidogrel  (PLAVIX ) 75 MG tablet Take 1 tablet (75 mg total) by mouth daily. 30 tablet 0   metoprolol  succinate (TOPROL -XL) 50 MG 24 hr tablet Take 1 tablet (50 mg total) by mouth at bedtime. 90 tablet 2   pantoprazole  (PROTONIX ) 40 MG tablet Take 40 mg by mouth daily.     ramipril  (ALTACE ) 5 MG capsule Take 1 capsule (5 mg total) by mouth 2 (two) times daily. 180 capsule 1   Semaglutide ,0.25 or 0.5MG /DOS, (OZEMPIC , 0.25 OR 0.5 MG/DOSE,) 2 MG/3ML SOPN Inject 0.25 mg into the skin once a week. (Patient not taking: Reported on 05/07/2024) 3 mL 3   Semaglutide ,0.25 or 0.5MG /DOS, (OZEMPIC , 0.25 OR 0.5 MG/DOSE,) 2 MG/3ML SOPN Inject 0.25 mg into the skin once a week. (Patient not taking: Reported on 05/07/2024) 3 mL 0   Semaglutide ,0.25 or 0.5MG /DOS, (OZEMPIC , 0.25 OR 0.5 MG/DOSE,) 2 MG/3ML SOPN Inject 0.5 mg into the skin once a week. (Patient not taking: Reported on 05/07/2024) 3 mL 3   No current facility-administered medications on file prior to visit.  [2]  Allergies Allergen Reactions   Other Shortness Of Breath    Plants and trees - SOB, wheezing   Motrin [Ibuprofen]     Contraindicated by CKD   Ozempic  (0.25 Or 0.5 Mg-Dose) [Semaglutide (0.25 Or 0.5mg -Dos)]     GI intolerance   "

## 2024-05-07 NOTE — Telephone Encounter (Signed)
 Referral for physical therapy fax to [facility]. Phone: [phone], Fax: [phone]

## 2024-05-08 NOTE — Telephone Encounter (Signed)
 Fyi    Pt called to request for referral to be sent to [facility] Physical  Therapy   [facility] Phone:[phone] Fax: [phone]  Referral faxed

## 2024-05-26 ENCOUNTER — Ambulatory Visit: Admitting: Cardiovascular Disease

## 2025-05-07 ENCOUNTER — Ambulatory Visit: Admitting: Neurology
